# Patient Record
Sex: Female | Born: 1985 | Race: Black or African American | Hispanic: No | Smoking: Current every day smoker
Health system: Southern US, Community
[De-identification: ages and names within clinical notes are randomized; demographics above are authoritative.]

## PROBLEM LIST (undated history)

## (undated) ENCOUNTER — Inpatient Hospital Stay (HOSPITAL_COMMUNITY): Payer: Self-pay

## (undated) ENCOUNTER — Ambulatory Visit (HOSPITAL_COMMUNITY): Admission: EM | Payer: Medicaid Other | Source: Home / Self Care

## (undated) DIAGNOSIS — J302 Other seasonal allergic rhinitis: Secondary | ICD-10-CM

## (undated) DIAGNOSIS — F419 Anxiety disorder, unspecified: Secondary | ICD-10-CM

## (undated) DIAGNOSIS — A749 Chlamydial infection, unspecified: Secondary | ICD-10-CM

## (undated) DIAGNOSIS — T8384XA Pain from genitourinary prosthetic devices, implants and grafts, initial encounter: Secondary | ICD-10-CM

## (undated) DIAGNOSIS — F909 Attention-deficit hyperactivity disorder, unspecified type: Secondary | ICD-10-CM

## (undated) DIAGNOSIS — G43909 Migraine, unspecified, not intractable, without status migrainosus: Secondary | ICD-10-CM

## (undated) DIAGNOSIS — Z3A28 28 weeks gestation of pregnancy: Secondary | ICD-10-CM

## (undated) DIAGNOSIS — K589 Irritable bowel syndrome without diarrhea: Secondary | ICD-10-CM

## (undated) HISTORY — PX: IUD REMOVAL: SHX5392

## (undated) HISTORY — PX: NO PAST SURGERIES: SHX2092

## (undated) HISTORY — DX: Chlamydial infection, unspecified: A74.9

## (undated) HISTORY — PX: MOUTH SURGERY: SHX715

---
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
## 2007-02-25 ENCOUNTER — Emergency Department (HOSPITAL_COMMUNITY): Admission: EM | Admit: 2007-02-25 | Discharge: 2007-02-25 | Payer: Self-pay | Admitting: Emergency Medicine

## 2007-05-31 ENCOUNTER — Inpatient Hospital Stay (HOSPITAL_COMMUNITY): Admission: AD | Admit: 2007-05-31 | Discharge: 2007-06-01 | Payer: Self-pay | Admitting: Obstetrics and Gynecology

## 2007-05-31 ENCOUNTER — Emergency Department (HOSPITAL_COMMUNITY): Admission: EM | Admit: 2007-05-31 | Discharge: 2007-05-31 | Payer: Self-pay | Admitting: Family Medicine

## 2007-10-22 ENCOUNTER — Inpatient Hospital Stay (HOSPITAL_COMMUNITY): Admission: AD | Admit: 2007-10-22 | Discharge: 2007-10-22 | Payer: Self-pay | Admitting: Obstetrics

## 2007-12-22 ENCOUNTER — Inpatient Hospital Stay (HOSPITAL_COMMUNITY): Admission: AD | Admit: 2007-12-22 | Discharge: 2007-12-24 | Payer: Self-pay | Admitting: Obstetrics

## 2007-12-22 ENCOUNTER — Encounter (INDEPENDENT_AMBULATORY_CARE_PROVIDER_SITE_OTHER): Payer: Self-pay | Admitting: Obstetrics

## 2008-02-27 ENCOUNTER — Emergency Department (HOSPITAL_COMMUNITY): Admission: EM | Admit: 2008-02-27 | Discharge: 2008-02-28 | Payer: Self-pay | Admitting: Emergency Medicine

## 2009-10-13 ENCOUNTER — Encounter: Payer: Self-pay | Admitting: Family Medicine

## 2009-10-18 ENCOUNTER — Ambulatory Visit: Payer: Self-pay | Admitting: Family Medicine

## 2009-11-06 ENCOUNTER — Encounter (INDEPENDENT_AMBULATORY_CARE_PROVIDER_SITE_OTHER): Payer: Self-pay | Admitting: *Deleted

## 2009-11-06 DIAGNOSIS — F17200 Nicotine dependence, unspecified, uncomplicated: Secondary | ICD-10-CM | POA: Insufficient documentation

## 2009-12-28 ENCOUNTER — Telehealth: Payer: Self-pay | Admitting: Family Medicine

## 2010-01-10 ENCOUNTER — Ambulatory Visit: Payer: Self-pay | Admitting: Family Medicine

## 2010-01-10 ENCOUNTER — Encounter: Payer: Self-pay | Admitting: Family Medicine

## 2010-01-18 ENCOUNTER — Telehealth: Payer: Self-pay | Admitting: Family Medicine

## 2010-01-24 ENCOUNTER — Telehealth: Payer: Self-pay | Admitting: Family Medicine

## 2010-01-25 ENCOUNTER — Encounter: Payer: Self-pay | Admitting: *Deleted

## 2010-02-01 ENCOUNTER — Ambulatory Visit: Payer: Self-pay | Admitting: Family Medicine

## 2010-03-21 ENCOUNTER — Emergency Department (HOSPITAL_COMMUNITY): Admission: EM | Admit: 2010-03-21 | Discharge: 2010-03-21 | Payer: Self-pay | Admitting: Emergency Medicine

## 2010-04-20 ENCOUNTER — Encounter: Payer: Self-pay | Admitting: Family Medicine

## 2010-06-17 ENCOUNTER — Telehealth: Payer: Self-pay | Admitting: Family Medicine

## 2010-09-13 ENCOUNTER — Emergency Department (HOSPITAL_COMMUNITY): Admission: EM | Admit: 2010-09-13 | Discharge: 2010-09-13 | Payer: Self-pay | Admitting: Family Medicine

## 2010-12-04 NOTE — Progress Notes (Signed)
Summary: triage  Phone Note Call from Patient Call back at [phone]   Caller: Patient Summary of Call: Pt not feeling well and wanting to be seen today. Initial call taken by: Clydell Hakim,  December 28, 2009 12:16 PM  Follow-up for Phone Call        had bad pain last night in lower abd when she was having a BM. did not take any pain meds. feels weak & shaky. pain still coming & going. on depo x 1 yr. still bleeding every day. very much affecting her life. wants this investigated. to be here at 4pm to see her pcp Follow-up by: Golden Circle RN,  December 28, 2009 12:21 PM

## 2010-12-04 NOTE — Miscellaneous (Signed)
Summary: [name]  Clinical Lists Changes  Problems: Added new problem of [name] (ICD-305.1) 

## 2010-12-04 NOTE — Progress Notes (Signed)
Summary: pls call  Phone Note Call from Patient Call back at [phone]   Caller: Patient Summary of Call: has an IUD and needs to talk to doctor about it ASAP Initial call taken by: De Nurse,  January 24, 2010 4:26 PM  Follow-up for Phone Call        partner complaining about pain with intercourse. thinks it is related to IUD. only upon entry. patient unable to palpate strings. missed last appt for eval. recommend calling back for appt tomorrow. i am willing to see if placed on work in schedule Follow-up by: Lequita Asal  MD,  January 24, 2010 4:40 PM     Appended Document: pls call I called & made another appt. told her she must come to her appts or give 24hr notice. to Dennison Nancy, Photographer for letter to pt about her dnkas

## 2010-12-04 NOTE — Letter (Signed)
Summary: Probation Letter  [facility]  [address]   [place], [place] [address]   Phone: [phone]  Fax: [phone]    01/25/2010  Vanessa Wise [address] [place], [place]  [address]  Dear Ms. Tirrell,  With the goal of better serving all our patients the [facility] is following each patient's missed appointments.  You have missed at least 3 appointments with our practice.If you cannot keep your appointment, we expect you to call at least 24 hours before your appointment time.  Missing appointments prevents other patients from seeing [place] and makes it difficult to provide you with the best possible medical care.      1.   If you miss one more appointment, we will only give you limited medical services. This means we will not call in medication refills, complete a form, or make a referral for you except when you are here for a scheduled office visit.    2.   If you miss 2 or more appointments in the next year, we will dismiss you from our practice.    Our office staff can be reached at [phone] Monday through Friday from 8:30 a.m.-5:00 p.m. and will be glad to schedule your appointment as necessary.    Thank you.   [facility]

## 2010-12-04 NOTE — Assessment & Plan Note (Signed)
Summary: iud insertion,tcb   Vital Signs:  Patient profile:   25 year old female Weight:      157.6 pounds Temp:     97.7 degrees F oral Pulse rate:   88 / minute Pulse rhythm:   regular BP sitting:   118 / 79  (left arm)  Vitals Entered By: Loralee Pacas CMA (January 10, 2010 3:31 PM)  Primary Care Provider:  Lequita Asal  MD  CC:  IUD insertion.  History of Present Illness: Patient is 25 y/o G2 here for Mirena IUD. in monogamous relationship. currently using depo for contraception. UPT negative.   Procedure note- Mirena insertion Upreg was negative. Risks and benefits were discussed with patient, and consent was obtained. Signed consent form to be scanned into electronic medical record. Speculum was placed in vagina. Using standard sterile procedure, the cervix was visualized then cleaned with betadine. The cervix was then grasped at the 10 and 2 o'clock positions with the tenaculum. The uterus was sounded and found to have a depth of 9.5 cm. The IUD and insertion device were removed from sterile packaging and adjusted to 9.5 cm. The IUD insertion device was inserted into the uterus, via the cervix, and the IUD was deployed. The insertion device was removed, and the IUD remained in place. The strings were cut to approximately 3 cm in length. The tenaculum was removed, and pressure was applied to cervix using fox swabs. Hemostasis was achieved after minimal bleeding. Patient tolerated procedure well and there were no complications. She was given patient information.  Allergies: No Known Drug Allergies   Impression & Recommendations:  Problem # 1:  ENC FOR INSERTION INTRAUTERINE CONTRACEPT DEVICE (ICD-V25.11) Assessment New  see procedure note. patient tolerated well. remove in 5 years or sooner per patient request.   Orders: IUD insert- [facility] [phone])  Other Orders: U Preg-[facility] ([id number]) IUD Supply-[facility] (S0630)  Laboratory Results   Urine Tests  Date/Time Received: January 10, 2010 3:33 PM  Date/Time Reported: January 10, 2010 3:42 PM     Urine HCG: negative Comments: ...............test performed by......Marland KitchenBonnie A. [place], MLS (ASCP)cm

## 2010-12-04 NOTE — Progress Notes (Signed)
Summary: Triage  Phone Note Call from Patient Call back at [phone]   Reason for Call: Talk to Nurse Summary of Call: pt recently got IUD & is concerned b/c she cannot feel the strings and her b/f says it hurts him during intercourse. Initial call taken by: Knox Royalty,  January 18, 2010 11:44 AM  Follow-up for Phone Call        states boyfriend can feel strings when he is inside & it hurts him. appt tomorrow with pcp Follow-up by: Golden Circle RN,  January 18, 2010 11:47 AM

## 2010-12-04 NOTE — Progress Notes (Signed)
  Phone Note Call from Patient   Caller: Patient Summary of Call: Patient called with CC of mild irritation in her vagina during intercourse.  States it has been a long-term problem, has seen Dr. Lanier Prude for this issue in the past.  Dr. Lanier Prude stated strings are of good length and not to shorten.  Pt states boyfriend still complains of feeling strings during intercourse.  Patient would like to have vagina inspected to make sure irritation is nothing to be worried about.  Denies any sexual intercourse for past several weeks, any vaginal discharge, vaginal bleeding, abdominal pain, fever, chills, red streaks in genitals/legs/abdomen.  Patient states she would also like to have Mirena removed.  Wants to come as work-in tomorrow morning.  Explained there may be a wait.  She only wants vagina inspected to make sure not infected or irritated.  Initial call taken by: Renold Don MD,  June 17, 2010 8:40 AM

## 2010-12-04 NOTE — Assessment & Plan Note (Signed)
Summary: iud problem,tcb   Vital Signs:  Patient profile:   25 year old female Temp:     98.8 degrees F CC: iud problem Comments boyfriend is complaining that the IUD is bothering him   Primary Care Provider:  Lequita Asal  MD  CC:  iud problem.  History of Present Illness: 1.  Got IUD in early march.  having problems with intercourse since then.  first time, boyfriend felt like he could not get into her vagina and he felt strings.  then next time painfu for herl--like bad menstrual cramps.  also, she can't feel strings.  she wants Iud checked to make sure it is in place.    Current Medications (verified): 1)  None  Allergies: No Known Drug Allergies  Review of Systems General:  Denies fever and loss of appetite. GU:  Denies abnormal vaginal bleeding and discharge.  Physical Exam  General:  Well-developed,well-nourished,in no acute distress; alert,appropriate and cooperative throughout examination Genitalia:  Pelvic Exam:        External: normal female genitalia without lesions or masses        Vagina: normal without lesions or masses        Cervix: normal without lesions or masses; IUD strings visualized        Adnexa: normal bimanual exam without masses or fullness        Uterus: normal by palpation        Pap smear: not performed Additional Exam:  vital signs reviewed    Impression & Recommendations:  Problem # 1:  CONTRACEPTIVE MANAGEMENT (ICD-V25.09) Assessment Unchanged  IUD in place.  strings appear to be a good length; would not want to shorten.  provided reassurance.    Orders: [facility]- Est Level  3 ([id number])  Patient Instructions: 1)  It was nice to see you today. 2)  Your IUD is in the right place; I think everything looks fine. 3)  Call us if you have any problems or questions.

## 2010-12-04 NOTE — Miscellaneous (Signed)
  Clinical Lists Changes  Medications: Added new medication of MIRENA 20 MCG/24HR IUD (LEVONORGESTREL)

## 2010-12-04 NOTE — Miscellaneous (Signed)
  Clinical Lists Changes  Problems: Removed problem of ENC FOR INSERTION INTRAUTERINE CONTRACEPT DEVICE (ICD-V25.11) Removed problem of CONTRACEPTIVE MANAGEMENT (ICD-V25.09) Removed problem of ABNORMAL VAGINAL BLEEDING (ICD-626.9)

## 2010-12-07 NOTE — Miscellaneous (Signed)
Summary: Consent for IUD Insertion  Consent for IUD Insertion   Imported By: Knox Royalty 04/12/2010 10:11:31  _____________________________________________________________________  External Attachment:    Type:   Image     Comment:   External Document

## 2010-12-12 ENCOUNTER — Encounter: Payer: Self-pay | Admitting: *Deleted

## 2011-03-06 ENCOUNTER — Emergency Department (HOSPITAL_COMMUNITY)
Admission: EM | Admit: 2011-03-06 | Discharge: 2011-03-06 | Disposition: A | Discharge status: Home / Self Care | Payer: Self-pay | Attending: Emergency Medicine | Admitting: Emergency Medicine

## 2011-03-06 DIAGNOSIS — K089 Disorder of teeth and supporting structures, unspecified: Secondary | ICD-10-CM | POA: Insufficient documentation

## 2011-06-23 ENCOUNTER — Emergency Department (HOSPITAL_COMMUNITY)
Admission: EM | Admit: 2011-06-23 | Discharge: 2011-06-23 | Disposition: A | Discharge status: Home / Self Care | Payer: 59 | Attending: Emergency Medicine | Admitting: Emergency Medicine

## 2011-06-23 DIAGNOSIS — R112 Nausea with vomiting, unspecified: Secondary | ICD-10-CM | POA: Insufficient documentation

## 2011-06-23 DIAGNOSIS — R5381 Other malaise: Secondary | ICD-10-CM | POA: Insufficient documentation

## 2011-06-23 DIAGNOSIS — R51 Headache: Secondary | ICD-10-CM | POA: Insufficient documentation

## 2011-06-23 LAB — POCT I-STAT, CHEM 8
Calcium, Ion: 1.14 mmol/L (ref 1.12–1.32)
Chloride: 106 mEq/L (ref 96–112)
Creatinine, Ser: 0.9 mg/dL (ref 0.50–1.10)
Glucose, Bld: 93 mg/dL (ref 70–99)
Hemoglobin: 15 g/dL (ref 12.0–15.0)
Potassium: 3.5 mEq/L (ref 3.5–5.1)

## 2011-06-23 LAB — RAPID URINE DRUG SCREEN, HOSP PERFORMED
Cocaine: NOT DETECTED
Opiates: NOT DETECTED

## 2011-06-23 LAB — URINE MICROSCOPIC-ADD ON

## 2011-06-23 LAB — URINALYSIS, ROUTINE W REFLEX MICROSCOPIC
Glucose, UA: NEGATIVE mg/dL
Hgb urine dipstick: NEGATIVE
Specific Gravity, Urine: 1.028 (ref 1.005–1.030)

## 2011-06-27 ENCOUNTER — Inpatient Hospital Stay (INDEPENDENT_AMBULATORY_CARE_PROVIDER_SITE_OTHER)
Admission: RE | Admit: 2011-06-27 | Discharge: 2011-06-27 | Disposition: A | Discharge status: Home / Self Care | Payer: 59 | Source: Ambulatory Visit | Attending: Family Medicine | Admitting: Family Medicine

## 2011-06-27 DIAGNOSIS — R51 Headache: Secondary | ICD-10-CM

## 2011-06-28 ENCOUNTER — Ambulatory Visit: Payer: 59 | Admitting: Family Medicine

## 2011-06-28 ENCOUNTER — Telehealth: Payer: Self-pay | Admitting: Family Medicine

## 2011-06-28 NOTE — Telephone Encounter (Signed)
Patient did call back and spoke with Abundio Miu earlier this afternoon. States she was told she needs to come to office now and she is on her way. Her Uncle has something to do with his car now but she will be on her way.

## 2011-06-28 NOTE — Telephone Encounter (Signed)
I called patient and advised to come to office now and we will work here in. States she cannot get here until 4:00.  Advised she will need to come now because we are working her in .  States she will try and call back.

## 2011-06-28 NOTE — Telephone Encounter (Signed)
Patient called in earlier, very upset.  The following was entered into the wrong patient file.  I passed this information on to [place] who in turn spoke with Dr. Leveda Anna.  Dr. Leveda Anna arrange for the patient to come in and be seen by him.  That encounter follows.  Cammie Mcgee 06/28/2011 12:22 PM Signed  Is having problems with her Birth Control removed. Came in yesterday to be seen emergently yesterday, did not have an appt. She calls today because she is upset that she was turned away. I explained that this is not an Urgent Care office and that we only see patients by appt. She wants her medical records and she sounded threatening when she said she wants them today and someone will need to remove the Birth Control today before she has to get really ugly.

## 2011-06-28 NOTE — Telephone Encounter (Signed)
Vanessa Wise has had IUD Mirena since 01/2010.  Has had increasing headaches since then.  Now, having memory loss with headache.  Has been seen in ER x 2.  [phone].  Will see today only to take Mirena out.  Make an appointment in 2 weeks to follow up with headaches and decide on future birth control

## 2011-07-10 ENCOUNTER — Ambulatory Visit: Payer: 59 | Admitting: Family Medicine

## 2011-07-19 ENCOUNTER — Emergency Department (HOSPITAL_COMMUNITY)
Admission: EM | Admit: 2011-07-19 | Discharge: 2011-07-19 | Disposition: A | Discharge status: Home / Self Care | Payer: 59 | Attending: Emergency Medicine | Admitting: Emergency Medicine

## 2011-07-19 DIAGNOSIS — R112 Nausea with vomiting, unspecified: Secondary | ICD-10-CM | POA: Insufficient documentation

## 2011-07-19 DIAGNOSIS — R1013 Epigastric pain: Secondary | ICD-10-CM | POA: Insufficient documentation

## 2011-07-19 LAB — DIFFERENTIAL
Eosinophils Relative: 2 % (ref 0–5)
Lymphocytes Relative: 36 % (ref 12–46)
Lymphs Abs: 3.2 10*3/uL (ref 0.7–4.0)
Monocytes Absolute: 0.6 10*3/uL (ref 0.1–1.0)
Monocytes Relative: 6 % (ref 3–12)

## 2011-07-19 LAB — COMPREHENSIVE METABOLIC PANEL
Alkaline Phosphatase: 59 U/L (ref 39–117)
BUN: 13 mg/dL (ref 6–23)
GFR calc Af Amer: >60 mL/min (ref >60)
GFR calc non Af Amer: >60 mL/min (ref >60)
Glucose, Bld: 97 mg/dL (ref 70–99)
Potassium: 3.5 mEq/L (ref 3.5–5.1)
Total Bilirubin: 0.3 mg/dL (ref 0.3–1.2)
Total Protein: 8.1 g/dL (ref 6.0–8.3)

## 2011-07-19 LAB — URINALYSIS, ROUTINE W REFLEX MICROSCOPIC
Bilirubin Urine: NEGATIVE
Hgb urine dipstick: NEGATIVE
Specific Gravity, Urine: 1.033 — ABNORMAL HIGH (ref 1.005–1.030)
Urobilinogen, UA: 1 mg/dL (ref 0.0–1.0)

## 2011-07-19 LAB — CBC
HCT: 37.2 % (ref 36.0–46.0)
MCHC: 34.1 g/dL (ref 30.0–36.0)
MCV: 85.3 fL (ref 78.0–100.0)
RDW: 13 % (ref 11.5–15.5)
WBC: 8.8 10*3/uL (ref 4.0–10.5)

## 2011-07-19 LAB — LIPASE, BLOOD: Lipase: 28 U/L (ref 11–59)

## 2011-07-26 LAB — COMPREHENSIVE METABOLIC PANEL
Albumin: 2.5 — ABNORMAL LOW
BUN: 10
Calcium: 8.2 — ABNORMAL LOW
Creatinine, Ser: 0.76
Potassium: 3.8
Total Protein: 6

## 2011-07-26 LAB — CBC
HCT: 31.7 — ABNORMAL LOW
HCT: 32.6 — ABNORMAL LOW
Hemoglobin: 11.2 — ABNORMAL LOW
Hemoglobin: 11.4 — ABNORMAL LOW
MCHC: 33.7
MCHC: 34
MCV: 90
MCV: 90.2
Platelets: 96 — ABNORMAL LOW
RBC: 3.52 — ABNORMAL LOW
RBC: 3.66 — ABNORMAL LOW
RBC: 3.67 — ABNORMAL LOW
WBC: 4.7
WBC: 5.5

## 2011-07-26 LAB — URIC ACID: Uric Acid, Serum: 7

## 2011-08-09 LAB — URINALYSIS, ROUTINE W REFLEX MICROSCOPIC
Bilirubin Urine: NEGATIVE
Glucose, UA: NEGATIVE
Ketones, ur: 15 — AB
Protein, ur: NEGATIVE

## 2011-08-19 LAB — URINALYSIS, ROUTINE W REFLEX MICROSCOPIC
Glucose, UA: NEGATIVE
Ketones, ur: >80 — AB
Nitrite: NEGATIVE
Specific Gravity, Urine: 1.025
pH: 6

## 2011-08-19 LAB — POCT URINALYSIS DIP (DEVICE)
Glucose, UA: NEGATIVE
Hgb urine dipstick: NEGATIVE
Ketones, ur: >=160 — AB
Specific Gravity, Urine: 1.02
Urobilinogen, UA: 1

## 2011-08-19 LAB — CBC
MCHC: 33.3
MCV: 86.6
Platelets: 241
RBC: 4.27
RDW: 13.1

## 2011-08-19 LAB — BASIC METABOLIC PANEL
BUN: 9
CO2: 25
Calcium: 9.4
Chloride: 105
Creatinine, Ser: 0.7
GFR calc Af Amer: >60
Glucose, Bld: 83

## 2011-08-19 LAB — URINE CULTURE
Colony Count: NO GROWTH
Culture: NO GROWTH

## 2011-08-19 LAB — HCG, QUANTITATIVE, PREGNANCY: hCG, Beta Chain, Quant, S: 90986 — ABNORMAL HIGH

## 2011-08-19 LAB — WET PREP, GENITAL

## 2011-08-19 LAB — ABO/RH: ABO/RH(D): O POS

## 2011-08-19 LAB — TYPE AND SCREEN: ABO/RH(D): O POS

## 2011-08-19 LAB — GC/CHLAMYDIA PROBE AMP, GENITAL
Chlamydia, DNA Probe: NEGATIVE
GC Probe Amp, Genital: NEGATIVE

## 2011-09-27 ENCOUNTER — Telehealth: Payer: Self-pay | Admitting: Family Medicine

## 2011-09-27 NOTE — Telephone Encounter (Signed)
White vaginal discharge x 2-3 days. Itching and irritation x 1 day. Had intercourse yesterday and today and it was painful. Painful urination after intercourse. Patient had yeast infection in the past. She feels that the discharge is like yeast but this is more irritating.  Patient is otherwise well. Advised pt that this sounds like yeast infection but could also be BV, trich, other STI. Pt's states her BF is now complaining of penile discomfort and "bumps". Asked pt to come in on Monday for pelvic exam, cx, ua and that her boyfriend should also be checked. Advised warm compress, avoid irritating soaps and sex until checked. Pt voiced understanding.

## 2011-10-15 ENCOUNTER — Telehealth: Payer: Self-pay | Admitting: *Deleted

## 2011-10-15 NOTE — Telephone Encounter (Signed)
Phone # [phone]. Patient calls and message was sent to triage nurse on wrong patient .  It was determined that this is correct patient. States she has had vaginal discharge and itching and treated herself OTC with Monistat for 6 days.   Now her partner tells her that he is having itching on penis. She is wondering if cream she used could have caused his itching . Consulted with Dr. Jennette Kettle and she advises that this is a possibility but only way to know is for him to be checked. Also   suggested that patient make appointment for follow up  and appointment is scheduled for tomorrow.

## 2011-10-16 ENCOUNTER — Ambulatory Visit: Payer: 59

## 2011-10-27 ENCOUNTER — Telehealth: Payer: Self-pay | Admitting: Family Medicine

## 2011-10-27 NOTE — Telephone Encounter (Signed)
Had a yeast infection and used monistat cream which helped. Boyfriend feels irritation of his penis. Will come in next week for STI testing. Feels well otherwise.

## 2011-12-13 ENCOUNTER — Telehealth: Payer: Self-pay | Admitting: Family Medicine

## 2011-12-13 NOTE — Telephone Encounter (Signed)
Patient would like to speak to the nurse about some problems with her Birth Control.

## 2011-12-13 NOTE — Telephone Encounter (Signed)
Message left on voicemail to return call.

## 2011-12-16 NOTE — Telephone Encounter (Signed)
Message again left advised  to call back if needed otherwise will not try to call again.

## 2011-12-16 NOTE — Telephone Encounter (Signed)
Message again  left to return call. 

## 2011-12-31 ENCOUNTER — Ambulatory Visit: Payer: 59 | Admitting: Family Medicine

## 2012-01-28 ENCOUNTER — Other Ambulatory Visit (HOSPITAL_COMMUNITY)
Admission: RE | Admit: 2012-01-28 | Discharge: 2012-01-28 | Disposition: A | Discharge status: Home / Self Care | Payer: 59 | Source: Ambulatory Visit | Attending: Family Medicine | Admitting: Family Medicine

## 2012-01-28 ENCOUNTER — Other Ambulatory Visit: Payer: Self-pay | Admitting: Family Medicine

## 2012-01-28 ENCOUNTER — Ambulatory Visit: Payer: 59 | Admitting: Family Medicine

## 2012-01-28 ENCOUNTER — Ambulatory Visit (INDEPENDENT_AMBULATORY_CARE_PROVIDER_SITE_OTHER): Payer: 59 | Admitting: Family Medicine

## 2012-01-28 ENCOUNTER — Encounter: Payer: Self-pay | Admitting: Family Medicine

## 2012-01-28 VITALS — BP 125/71 | HR 113 | Ht 64.5 in | Wt 137.0 lb

## 2012-01-28 DIAGNOSIS — G43709 Chronic migraine without aura, not intractable, without status migrainosus: Secondary | ICD-10-CM

## 2012-01-28 DIAGNOSIS — F17200 Nicotine dependence, unspecified, uncomplicated: Secondary | ICD-10-CM

## 2012-01-28 DIAGNOSIS — Z113 Encounter for screening for infections with a predominantly sexual mode of transmission: Secondary | ICD-10-CM | POA: Insufficient documentation

## 2012-01-28 DIAGNOSIS — N898 Other specified noninflammatory disorders of vagina: Secondary | ICD-10-CM

## 2012-01-28 DIAGNOSIS — F411 Generalized anxiety disorder: Secondary | ICD-10-CM

## 2012-01-28 DIAGNOSIS — T8332XA Displacement of intrauterine contraceptive device, initial encounter: Secondary | ICD-10-CM | POA: Insufficient documentation

## 2012-01-28 DIAGNOSIS — F419 Anxiety disorder, unspecified: Secondary | ICD-10-CM | POA: Insufficient documentation

## 2012-01-28 DIAGNOSIS — F1220 Cannabis dependence, uncomplicated: Secondary | ICD-10-CM | POA: Insufficient documentation

## 2012-01-28 DIAGNOSIS — O26899 Other specified pregnancy related conditions, unspecified trimester: Secondary | ICD-10-CM

## 2012-01-28 DIAGNOSIS — R109 Unspecified abdominal pain: Secondary | ICD-10-CM | POA: Insufficient documentation

## 2012-01-28 DIAGNOSIS — T8389XA Other specified complication of genitourinary prosthetic devices, implants and grafts, initial encounter: Secondary | ICD-10-CM

## 2012-01-28 LAB — POCT URINE PREGNANCY: Preg Test, Ur: NEGATIVE

## 2012-01-28 LAB — POCT URINALYSIS DIPSTICK
Blood, UA: NEGATIVE
Glucose, UA: NEGATIVE
Ketones, UA: 80
Spec Grav, UA: >=1.03

## 2012-01-28 LAB — POCT WET PREP (WET MOUNT)

## 2012-01-28 MED ORDER — METRONIDAZOLE 500 MG PO TABS: 500.0000 mg | ORAL_TABLET | Freq: Two times a day (BID) | ORAL | Status: AC

## 2012-01-28 MED ORDER — SUMATRIPTAN SUCCINATE 25 MG PO TABS: ORAL_TABLET | ORAL | Status: DC

## 2012-01-28 NOTE — Assessment & Plan Note (Signed)
Mild abdominal cramping likely do to BV versus IUD. KUB pending.

## 2012-01-28 NOTE — Assessment & Plan Note (Signed)
Patient with migraine disorder. Neuro exam is essentially normal. She is a positive family history for migraine.  Plan to provide abortive therapy with sumatriptan.  Additionally I recommend follow with primary care in 1-4 weeks for initiation of prophylactic therapy.  Perhaps propranolol.  Discussed warning signs the patient.

## 2012-01-28 NOTE — Patient Instructions (Signed)
Thank you for coming in today. I will call you with your test results.  Make sure your phone is handy.  Use the summitriptan as needed for migraines.  We need a Xray to make sure the mirena is in your body.  Follow up with Dr. Rivka Safer in 1-4 weeks.  Stop using mariajuana. It is making your anxiety worse.

## 2012-01-28 NOTE — Assessment & Plan Note (Signed)
Patient is a daily marijuana user this is contributing to her anxiety I advised cessation

## 2012-01-28 NOTE — Assessment & Plan Note (Signed)
Her tobacco dependence is worsening her bacterial vaginosis I advised cessation

## 2012-01-28 NOTE — Assessment & Plan Note (Signed)
BV present on wet prep.  GC, chlamydia, HIV, RPR are pending.  Plan to treat with metronidazole, and followup in one to 4 weeks with Dr. Rivka Safer her primary care doctor.

## 2012-01-28 NOTE — Assessment & Plan Note (Signed)
No IUD strings are present. Patient is not pregnant. Plan for a KUB to confirm position of IUD.

## 2012-01-28 NOTE — Progress Notes (Signed)
Vanessa Wise is a 26 y.o. female who presents to [facility] today for   1) Headaches: Present for up to 2 years now. Headaches occur approximately 3 times a week and are often severe and debilitating. They will last until she goes to sleep. They're usually unilateral and pounding. Sometimes they're associated with vomiting. Often they are associated with photophobia and phonophobia. She denies any numbness dizziness lack of coordination. She is a positive family history for migraines. She has not had her headaches diagnosed or treated. Daily Cipro for an approximately one time a week for headache.  2) vaginal discomfort. Patient has Mirena IUD, and will occasionally experience cramping following sex. Recently she's noted a foul-smelling vaginal discharge. She has intermittent vaginal spotting as well. She does not use condoms when she has sex and she is more than one sexual partner. She self treated for a yeast infection in January.  She denies any significant abdominal pain but does note some cramping.     PMH, SH reviewed: Positive for headaches. Patient is a smoker and daily marijuana user ROS as above otherwise neg. No Chest pain, palpitations, SOB, Fever, Chills, Abd pain, N/V/D. positive for daily anxiety with occasional panic.  Medications reviewed. Current Outpatient Prescriptions  Medication Sig Dispense Refill  . levonorgestrel (MIRENA) 20 MCG/24HR IUD 1 each by Intrauterine route once.        . SUMAtriptan (IMITREX) 25 MG tablet 1 pill at start of migraine. May repeat in 2 hours if still having headache. (limit of 2 pills per day)  10 tablet  1    Exam:  BP 125/71  Pulse 113  Ht 5' 4.5" (1.638 m)  Wt 137 lb (62.143 kg)  BMI 23.15 kg/m2 Gen: Well NAD, anxious appearing HEENT: EOMI,  MMM Lungs: CTABL Nl WOB Heart: RRR no MRG Abd: NABS, NT, ND Exts: Non edematous BL  LE, warm and well perfused.  GYN: Normal external genitalia. Vaginal canal with white thin discharge. Cervix normal  appearing. No IUD strings are present. Bimanual exam is normal with normal uterus size and position and no adnexal tenderness or fullness.   Neuro: Alert and oriented x3 cranial nerves II through XII are intact pupillary light reflexes normal.  Gait normal coordination normal.    Results for orders placed in visit on 01/28/12 (from the past 72 hour(s))  POCT WET PREP (WET MOUNT)     Status: Abnormal   Collection Time   01/28/12 11:30 AM      Component Value Range Comment   Source Wet Prep POC VAG      WBC, Wet Prep HPF POC 15-25      Bacteria Wet Prep HPF POC 3+ COCCI      Clue Cells Wet Prep HPF POC Many      Yeast Wet Prep HPF POC None      Trichomonas Wet Prep HPF POC none     POCT URINALYSIS DIPSTICK     Status: Normal   Collection Time   01/28/12 11:30 AM      Component Value Range Comment   Color, UA DARK YELLOW      Clarity, UA CLEAR      Glucose, UA NEG      Bilirubin, UA SMALL      Ketones, UA 80      Spec Grav, UA >=1.030      Blood, UA NEG      pH, UA 6.0      Protein, UA TRACE  Urobilinogen, UA 4.0      Nitrite, UA NEG      Leukocytes, UA Negative     POCT URINE PREGNANCY     Status: Normal   Collection Time   01/28/12 11:30 AM      Component Value Range Comment   Preg Test, Ur Negative       Abdominal KUB pending

## 2012-01-29 LAB — DRUG SCREEN, URINE
Amphetamine Screen, Ur: NEGATIVE
Benzodiazepines.: NEGATIVE
Cocaine Metabolites: NEGATIVE
Marijuana Metabolite: POSITIVE — AB

## 2012-01-30 ENCOUNTER — Telehealth: Payer: Self-pay | Admitting: *Deleted

## 2012-01-30 NOTE — Telephone Encounter (Signed)
Patient called concerned about VM left for her.  I informed her she has BV and that medication to treat had been sent to her. She is unable to pick up at that pharmacy. Will send to outpatient pharmacy at [facility]. Told her not to drink alcohol and avoid intercourse for about a week until she could clear infection.  Patient expressed understanding and amenable to plan.

## 2012-01-30 NOTE — Telephone Encounter (Signed)
Left message for patient to return call. She needs to schedule nurse visit for STD Tx.Labrandon Knoch, Rodena Medin

## 2012-02-03 ENCOUNTER — Encounter: Payer: Self-pay | Admitting: Family Medicine

## 2012-02-03 ENCOUNTER — Telehealth: Payer: Self-pay | Admitting: Family Medicine

## 2012-02-03 DIAGNOSIS — A749 Chlamydial infection, unspecified: Secondary | ICD-10-CM | POA: Insufficient documentation

## 2012-02-03 HISTORY — DX: Chlamydial infection, unspecified: A74.9

## 2012-02-03 MED ORDER — DOXYCYCLINE HYCLATE 100 MG PO TABS: 100.0000 mg | ORAL_TABLET | Freq: Two times a day (BID) | ORAL | Status: AC

## 2012-02-03 NOTE — Telephone Encounter (Signed)
Discussed dx of Chlamydia with patient. Discussed doxycycline with patient. She has no allergies.

## 2012-02-03 NOTE — Telephone Encounter (Signed)
Message copied by Edd Arbour on Mon Feb 03, 2012  1:32 PM ------      Message from: Tivis Ringer      Created: Mon Feb 03, 2012  8:56 AM                   ----- Message -----         From: [facility]         Sent: 01/29/2012   3:52 PM           To: Sanjuana Letters, MD

## 2012-02-04 ENCOUNTER — Telehealth: Payer: Self-pay | Admitting: Family Medicine

## 2012-02-04 NOTE — Telephone Encounter (Signed)
Spoke with pharmacy and Rx is ready to be picked up.  Called patient back twice and phone rung once and stopped ringing.  No voicemail available.  Will call patient back later today or await call back from patient.  Gaylene Brooks, RN

## 2012-02-04 NOTE — Telephone Encounter (Signed)
Patient is calling because [facility] did not receive the Rx for doxycycline.  She is asking for it to please be resent.

## 2012-02-04 NOTE — Telephone Encounter (Signed)
Faxed report to [facility]. Pt treated. Lorenda Hatchet, Renato Battles

## 2012-02-11 ENCOUNTER — Telehealth: Payer: Self-pay | Admitting: Family Medicine

## 2012-02-11 NOTE — Telephone Encounter (Signed)
Message copied by Edd Arbour on Tue Feb 11, 2012  8:48 AM ------      Message from: Barbaraann Barthel      Created: Mon Feb 10, 2012  3:40 PM                   ----- Message -----         From: Nestor Ramp, MD         Sent: 01/30/2012   4:13 PM           To: Barbaraann Barthel, MD                        ----- Message -----         From: [facility]         Sent: 01/29/2012   3:52 PM           To: Nestor Ramp, MD

## 2012-02-14 ENCOUNTER — Other Ambulatory Visit: Payer: Self-pay | Admitting: Family Medicine

## 2012-02-14 DIAGNOSIS — A749 Chlamydial infection, unspecified: Secondary | ICD-10-CM

## 2012-02-14 MED ORDER — AZITHROMYCIN 1 G PO PACK: 1.0000 g | PACK | Freq: Once | ORAL | Status: DC

## 2012-02-25 ENCOUNTER — Ambulatory Visit: Payer: 59 | Admitting: Family Medicine

## 2012-03-04 ENCOUNTER — Ambulatory Visit (INDEPENDENT_AMBULATORY_CARE_PROVIDER_SITE_OTHER): Payer: 59 | Admitting: Family Medicine

## 2012-03-04 ENCOUNTER — Emergency Department (HOSPITAL_COMMUNITY)
Admission: EM | Admit: 2012-03-04 | Discharge: 2012-03-04 | Payer: 59 | Source: Home / Self Care | Attending: Emergency Medicine | Admitting: Emergency Medicine

## 2012-03-04 ENCOUNTER — Ambulatory Visit
Admission: RE | Admit: 2012-03-04 | Discharge: 2012-03-04 | Disposition: A | Discharge status: Home / Self Care | Payer: 59 | Source: Ambulatory Visit | Attending: Family Medicine | Admitting: Family Medicine

## 2012-03-04 ENCOUNTER — Encounter: Payer: Self-pay | Admitting: Family Medicine

## 2012-03-04 DIAGNOSIS — T8332XA Displacement of intrauterine contraceptive device, initial encounter: Secondary | ICD-10-CM

## 2012-03-04 DIAGNOSIS — T8389XA Other specified complication of genitourinary prosthetic devices, implants and grafts, initial encounter: Secondary | ICD-10-CM

## 2012-03-04 DIAGNOSIS — N912 Amenorrhea, unspecified: Secondary | ICD-10-CM

## 2012-03-04 DIAGNOSIS — R109 Unspecified abdominal pain: Secondary | ICD-10-CM

## 2012-03-04 DIAGNOSIS — N760 Acute vaginitis: Secondary | ICD-10-CM

## 2012-03-04 LAB — POCT WET PREP (WET MOUNT): Clue Cells Wet Prep Whiff POC: NEGATIVE

## 2012-03-04 NOTE — Patient Instructions (Signed)
Make an appointment to see Dr Rivka Safer as soon as can  Get the xray  Consider stress or depression as a cause of your abdomen pain and discuss Dr Rivka Safer  If the pain is getting worse or you have severe bleeding or are feeling very depressed call us immediately

## 2012-03-04 NOTE — Progress Notes (Signed)
  Subjective:    Patient ID: Vanessa Wise, female    DOB: 1985/11/17, 26 y.o.   MRN: [id number]  HPI  Nausea/ vomiting Abdominal discomfort Has had continued intermittent AM nausea or vomiting associated with vague mild lower abdomen discomfort since last visit in 3/26.  Never got xray for IUD.  Did take treatment for bv which helped her discharge.   She would like her meriena removed (not sure when placed but believe about 2 years ago) because she believes is making her feel weird all over.  No fevers or bleeding or severe pain or vaginal discharge now  Review of Symptoms - see HPI  PMH - Smoking status noted.      Review of Systems     Objective:   Physical Exam Genitalia:  Normal introitus for age, no external lesions, scant white vaginal discharge, mucosa pink and moist, no vaginal or cervical lesions, No IUD string seen no vaginal atrophy, no friaility or hemorrhage, normal uterus size and position, no adnexal masses or tenderness Abdomen: soft and non-tender without masses, organomegaly or hernias noted.  No guarding or rebound Has gained about 8 lbs since last visit       Assessment & Plan:

## 2012-03-04 NOTE — Assessment & Plan Note (Signed)
Unchanged.  No signs of serious intraabdominal or gyn cause.  Need to locate IUD.   I briefly discussed stress/anxiety as a possible cause she readily agreed that it could be.   I suggested she discuss with her PCP at their next visit.

## 2012-03-04 NOTE — Assessment & Plan Note (Signed)
No sign on exam.  No symptoms consistent with infection or perforation. Suspect malpositioned in uterus.  Encouarged her to get her previously ordered xray asap

## 2012-07-01 ENCOUNTER — Telehealth: Payer: Self-pay | Admitting: Family Medicine

## 2012-07-01 ENCOUNTER — Ambulatory Visit: Payer: 59

## 2012-07-01 NOTE — Telephone Encounter (Signed)
Calling complaining of vaginal discharge and odor. Patient was treated for BV in May and is concerned she has it again. Told patient she must come to the clinic to be evaluated, and was scheduled at 10:00 am with cross cover.  Arkie Tagliaferro M. Alazne Quant, M.D.

## 2013-02-03 ENCOUNTER — Ambulatory Visit: Payer: 59 | Admitting: Family Medicine

## 2013-03-31 ENCOUNTER — Telehealth: Payer: Self-pay | Admitting: Family Medicine

## 2013-03-31 NOTE — Telephone Encounter (Signed)
LMOVM for patient to call back.  There are available appts on [facility] tomorrow if patient would like IUD removed.  Emilie Rutter, Darlyne Russian, CMA

## 2013-03-31 NOTE — Telephone Encounter (Signed)
Pt wants IUD taken out today.  Says the hospital says the people who put it in have to take it out. She wants it out immedately. " If it only took 15 minutes to put it in, it should only take 15 to take it out" Please advise

## 2013-04-01 NOTE — Telephone Encounter (Signed)
Patient called back and "wants IUD taken out today!"  Offered appt this morning with [facility], but she does not have a ride.  Patient started yelling, "y'all put this thing in me and y'all are gonna take it out of me. I am coming up there today even if I don't have an appt" and hung up.

## 2013-05-17 ENCOUNTER — Telehealth: Payer: Self-pay | Admitting: *Deleted

## 2013-05-17 ENCOUNTER — Emergency Department (HOSPITAL_COMMUNITY)
Admission: EM | Admit: 2013-05-17 | Discharge: 2013-05-17 | Disposition: A | Discharge status: Home / Self Care | Payer: 59 | Source: Home / Self Care

## 2013-05-17 NOTE — Telephone Encounter (Signed)
Pt reports right side pain , vaginal odor - white creamy discharge and would like to have mirena removed as soon as possible ( uncomfortable during sex per pt) - appointment made for Thursday at pt request. Wyatt Haste, RN-BSN

## 2013-05-19 NOTE — Telephone Encounter (Signed)
error 

## 2013-05-20 ENCOUNTER — Ambulatory Visit: Payer: 59 | Admitting: Family Medicine

## 2013-05-26 ENCOUNTER — Ambulatory Visit: Payer: 59 | Admitting: Family Medicine

## 2013-08-24 ENCOUNTER — Emergency Department (HOSPITAL_COMMUNITY)
Admission: EM | Admit: 2013-08-24 | Discharge: 2013-08-24 | Disposition: A | Discharge status: Home / Self Care | Payer: 59 | Attending: Emergency Medicine | Admitting: Emergency Medicine

## 2013-08-24 ENCOUNTER — Emergency Department (HOSPITAL_COMMUNITY): Payer: 59

## 2013-08-24 DIAGNOSIS — R141 Gas pain: Secondary | ICD-10-CM | POA: Insufficient documentation

## 2013-08-24 DIAGNOSIS — N739 Female pelvic inflammatory disease, unspecified: Secondary | ICD-10-CM | POA: Insufficient documentation

## 2013-08-24 DIAGNOSIS — R142 Eructation: Secondary | ICD-10-CM | POA: Insufficient documentation

## 2013-08-24 DIAGNOSIS — Z3202 Encounter for pregnancy test, result negative: Secondary | ICD-10-CM | POA: Insufficient documentation

## 2013-08-24 DIAGNOSIS — Z79899 Other long term (current) drug therapy: Secondary | ICD-10-CM | POA: Insufficient documentation

## 2013-08-24 DIAGNOSIS — F17200 Nicotine dependence, unspecified, uncomplicated: Secondary | ICD-10-CM | POA: Insufficient documentation

## 2013-08-24 DIAGNOSIS — Z8619 Personal history of other infectious and parasitic diseases: Secondary | ICD-10-CM | POA: Insufficient documentation

## 2013-08-24 DIAGNOSIS — N730 Acute parametritis and pelvic cellulitis: Secondary | ICD-10-CM

## 2013-08-24 LAB — WET PREP, GENITAL: Trich, Wet Prep: NONE SEEN

## 2013-08-24 LAB — URINALYSIS, ROUTINE W REFLEX MICROSCOPIC
Bilirubin Urine: NEGATIVE
Glucose, UA: NEGATIVE mg/dL
Hgb urine dipstick: NEGATIVE
Ketones, ur: 15 mg/dL — AB
Leukocytes, UA: NEGATIVE
pH: 6 (ref 5.0–8.0)

## 2013-08-24 MED ORDER — AZITHROMYCIN 250 MG PO TABS
1000.0000 mg | ORAL_TABLET | Freq: Once | ORAL | Status: AC
  Administered 2013-08-24: 1000 mg via ORAL
  Filled 2013-08-24: qty 4

## 2013-08-24 MED ORDER — DOXYCYCLINE HYCLATE 100 MG PO CAPS: 100.0000 mg | ORAL_CAPSULE | Freq: Two times a day (BID) | ORAL | Status: DC

## 2013-08-24 MED ORDER — CEFTRIAXONE SODIUM 250 MG IJ SOLR
250.0000 mg | Freq: Once | INTRAMUSCULAR | Status: AC
  Administered 2013-08-24: 250 mg via INTRAMUSCULAR
  Filled 2013-08-24: qty 250

## 2013-08-24 MED ORDER — FLUCONAZOLE 100 MG PO TABS
150.0000 mg | ORAL_TABLET | Freq: Every day | ORAL | Status: DC
  Administered 2013-08-24: 150 mg via ORAL
  Filled 2013-08-24: qty 2

## 2013-08-24 MED ORDER — METRONIDAZOLE 500 MG PO TABS: 500.0000 mg | ORAL_TABLET | Freq: Two times a day (BID) | ORAL | Status: DC

## 2013-08-24 MED ORDER — HYDROCODONE-ACETAMINOPHEN 5-325 MG PO TABS: 1.0000 | ORAL_TABLET | Freq: Four times a day (QID) | ORAL | Status: DC | PRN

## 2013-08-24 MED ORDER — ONDANSETRON 4 MG PO TBDP
4.0000 mg | ORAL_TABLET | Freq: Once | ORAL | Status: AC
  Administered 2013-08-24: 4 mg via ORAL
  Filled 2013-08-24: qty 1

## 2013-08-24 NOTE — ED Notes (Addendum)
Presents with vaginal irritation and pain, thick white discharge and abdominal pain described as "hell" and constant, abdominal distention associated with nausea, has Mirena, but has not had normal period in many months. Reports finding lump in left breast and tenderness to left breast. Denies diarrhea. Normal BM today.  Abdominal pain began one year ago.

## 2013-08-24 NOTE — ED Provider Notes (Signed)
CSN: [id number]     Arrival date & time 08/24/13  1551 History   First MD Initiated Contact with Patient 08/24/13 1625     Chief Complaint  Patient presents with  . multiple complaints    (Consider location/radiation/quality/duration/timing/severity/associated sxs/prior Treatment) HPI  Vanessa Wise 27 year old presents emergency department chief complaint of pelvic pain and discharge.  Patient states that she has been with a single female partner however approximately 3 days ago she had a sexual encounter with an ex-boyfriend.  Since that time she has had increasing vaginal discomfort, dyspareunia, thick white vaginal discharge and pelvic pain with abdominal distention.  Patient also currently has Mirena IUD.  Patient denies urinary symptoms, patient does not have regular menstrual periods after I the insertion.  Patient denies any fevers, chills, nausea, vomiting or flank pain.  Past Medical History  Diagnosis Date  . Chlamydia infection 02/03/2012   No past surgical history on file. No family history on file. History  Substance Use Topics  . Smoking status: Current Every Day Smoker -- 1.00 packs/day    Types: Cigarettes  . Smokeless tobacco: Not on file  . Alcohol Use: Not on file   OB History   Grav Para Term Preterm Abortions TAB SAB Ect Mult Living                 Review of Systems  Allergies  Review of patient's allergies indicates no known allergies.  Home Medications   Current Outpatient Rx  Name  Route  Sig  Dispense  Refill  . BIOTIN PO   Oral   Take 1 tablet by mouth daily.         Marland Kitchen ibuprofen (ADVIL,MOTRIN) 200 MG tablet   Oral   Take 400 mg by mouth every 6 (six) hours as needed for pain.         Marland Kitchen levonorgestrel (MIRENA) 20 MCG/24HR IUD   Intrauterine   1 each by Intrauterine route once.            BP 110/68  Pulse 96  Temp(Src) 98.9 F (37.2 C) (Oral)  Resp 20  Wt 143 lb 6 oz (65.034 kg)  BMI 24.6 kg/m2  SpO2 98% Physical Exam   Constitutional: She is oriented to person, place, and time. She appears well-developed and well-nourished. No distress.  HENT:  Head: Normocephalic and atraumatic.  Eyes: Conjunctivae are normal. No scleral icterus.  Neck: Normal range of motion.  Cardiovascular: Normal rate, regular rhythm and normal heart sounds.  Exam reveals no gallop and no friction rub.   No murmur heard. Pulmonary/Chest: Effort normal and breath sounds normal. No respiratory distress.  Abdominal: Soft. Bowel sounds are normal. She exhibits no distension and no mass. There is no tenderness. There is no guarding.  Genitourinary:  Pelvic exam: VULVA: vulvar tenderness BL, vulvar edema BL, VAGINA: vaginal tenderness, vaginal discharge - white, copious and creamy, obtained, CERVIX: DNA probe for chlamydia and GC obtained, cervical motion tenderness present, UNABLE TO VISUALIZE OR PALPATE IUD STRINGS, ADNEXA: tenderness bilateral.   Neurological: She is alert and oriented to person, place, and time.  Skin: Skin is warm and dry. She is not diaphoretic.   Ten systems reviewed and are negative for acute change, except as noted in the HPI. \  ED Course  Procedures (including critical care time) Labs Review Labs Reviewed  URINALYSIS, ROUTINE W REFLEX MICROSCOPIC - Abnormal; Notable for the following:    APPearance HAZY (*)    Ketones, ur 15 (*)  All other components within normal limits  WET PREP, GENITAL  GC/CHLAMYDIA PROBE AMP  POCT PREGNANCY, URINE  POCT PREGNANCY, URINE   Imaging Review No results found.  EKG Interpretation   None       MDM  No diagnosis found. 5:13 PM BP 110/68  Pulse 96  Temp(Src) 98.9 F (37.2 C) (Oral)  Resp 20  Wt 143 lb 6 oz (65.034 kg)  BMI 24.6 kg/m2  SpO2 98% Patient examination concerning for pelvic inflammatory disease patient has been given ceftriaxone 250 mg IM.  Azithromycin 1 g orally and will be discharged with doxycycline 100 mg twice a day for 2 weeks.  Exam  unable to locate the patient's IUD I sent her for limited pelvic ultrasound to locate IUD.   7:44 PM I spoke with Dr. Erin Fulling who states that it is no longer recommended to pull the IUD with PID. Patient will be discharged with Doxycycline. F/U at [facility] Pain meds at dis[facility]  Arthor Captain, PA-C 08/24/13 1952

## 2013-08-25 LAB — GC/CHLAMYDIA PROBE AMP: GC Probe RNA: NEGATIVE

## 2013-08-25 MED ORDER — HYDROCODONE-ACETAMINOPHEN 5-325 MG PO TABS: 1.0000 | ORAL_TABLET | Freq: Four times a day (QID) | ORAL | Status: DC | PRN

## 2013-08-25 NOTE — ED Provider Notes (Signed)
Shared service with midlevel provider. I have personally seen and examined the patient, providing direct face to face care, presenting with the chief complaint of abdominal pain. Physical exam findings include lower quadrant abd pain, no peritoneal signs. Pt is diabetic, has PID, [place] ordered to r/o TOA. She has 3 SIRS criteria. Broad spectrum AB started. Plan will be to admit. I have reviewed the nursing documentation on past medical history, family history, and social history.   Derwood Kaplan, MD 08/25/13 1514

## 2013-08-26 NOTE — ED Notes (Signed)
+   Chlamydia Patient treated with Rocephin And Zithromax-[facility] faxed 

## 2013-08-27 ENCOUNTER — Ambulatory Visit (INDEPENDENT_AMBULATORY_CARE_PROVIDER_SITE_OTHER): Payer: 59 | Admitting: Family Medicine

## 2013-08-27 ENCOUNTER — Encounter: Payer: Self-pay | Admitting: Family Medicine

## 2013-08-27 VITALS — BP 119/73 | HR 76 | Temp 98.2°F | Wt 143.8 lb

## 2013-08-27 DIAGNOSIS — T8332XD Displacement of intrauterine contraceptive device, subsequent encounter: Secondary | ICD-10-CM

## 2013-08-27 DIAGNOSIS — Z5189 Encounter for other specified aftercare: Secondary | ICD-10-CM

## 2013-08-27 DIAGNOSIS — N63 Unspecified lump in unspecified breast: Secondary | ICD-10-CM

## 2013-08-27 DIAGNOSIS — N6310 Unspecified lump in the right breast, unspecified quadrant: Secondary | ICD-10-CM

## 2013-08-27 NOTE — Patient Instructions (Addendum)
I have placed an order for a breast ultrasound you will receive a call with date and place. Continue your antibiotic course and then come back for reevaluation of your pelvic inflammatory disease before any IUD removal.

## 2013-08-27 NOTE — Progress Notes (Signed)
Family Medicine Office Visit Note   Subjective:   Patient ID: Vanessa Wise, female  DOB: 1986-04-30, 27 y.o.. MRN: [id number]   Pt that comes today for IUD removal. She reports has 2 children but is actively trying to conceive again. Her major reason for discontinuing birth control is a side effects she thinks is from her mirena (IUD). She complains of being moody and tired. She was recently seen (3 days ago) in ED and diagnosed with pelvic inflammatory disease. She has been on doxycycline, metronidazole, and received one dose of ceftriaxone IM. She continues to have vaginal discharge, denies pelvic pain.   Her second complaint is regarding a mass on her right breast she noticed 3 days ago. Mass is somehow tender to palpation but does not hurt she does not touch it. There's no redness or swelling reported no nipple discharge. Patient reports no close family history of breast cancer. The patient is a smoker of a pack a day  Review of Systems:  Per history of present illness  Objective:   Physical Exam: Gen:  NAD HEENT: Moist mucous membranes  CV: Regular rate and rhythm, no murmurs PULM: Clear to auscultation bilaterally. Breasts: Right breast with a mass located at 6:00. Mass consistency is soft no adherent to deep planes. Mobile and is felt to about 3 cm diameter. No axillary adenopathy. Left breast is normal, no masses is palpated, no axillary adenopathy either. ABD: Soft, non tender, non distended, normal bowel sounds.  Vulva and perianal area: Normal  Speculum: Vagina and cervix of normal appearance, no friability, abundant yellowish vaginal discharge. Bimanual exam: Uterus anteverted, no adnexal masses. Mild cervical motion tenderness.   Assessment & Plan:

## 2013-08-27 NOTE — Assessment & Plan Note (Signed)
Patient with diagnosed of PID and is currently under treatment. Not  recommended IUD removal procedure at this time.   IUD strings were not visualized. Limited bedside ultrasound and was also unsuccessful locating IUD. This was discussed in May/2013 and patient has not followup appropriately.

## 2013-08-27 NOTE — Assessment & Plan Note (Signed)
Diagnostic right breast ultrasound. Followup with results

## 2013-08-28 ENCOUNTER — Telehealth (HOSPITAL_COMMUNITY): Payer: Self-pay | Admitting: Emergency Medicine

## 2013-08-29 NOTE — ED Notes (Signed)
Informed patient of +Chlamydia and appropriate treatment. Advised her to abstain from sex for 2 weeks and notify partner to get tested.

## 2013-11-11 ENCOUNTER — Other Ambulatory Visit: Payer: Self-pay | Admitting: Family Medicine

## 2013-11-11 ENCOUNTER — Ambulatory Visit (HOSPITAL_COMMUNITY)
Admission: RE | Admit: 2013-11-11 | Discharge: 2013-11-11 | Disposition: A | Discharge status: Home / Self Care | Payer: Medicaid Other | Source: Ambulatory Visit | Attending: Family Medicine | Admitting: Family Medicine

## 2013-11-11 ENCOUNTER — Ambulatory Visit (INDEPENDENT_AMBULATORY_CARE_PROVIDER_SITE_OTHER): Payer: Medicaid Other | Admitting: Family Medicine

## 2013-11-11 DIAGNOSIS — T8332XD Displacement of intrauterine contraceptive device, subsequent encounter: Secondary | ICD-10-CM

## 2013-11-11 DIAGNOSIS — R1909 Other intra-abdominal and pelvic swelling, mass and lump: Secondary | ICD-10-CM | POA: Insufficient documentation

## 2013-11-11 DIAGNOSIS — R109 Unspecified abdominal pain: Secondary | ICD-10-CM

## 2013-11-11 DIAGNOSIS — Z5189 Encounter for other specified aftercare: Secondary | ICD-10-CM

## 2013-11-11 LAB — POCT URINE PREGNANCY: Preg Test, Ur: NEGATIVE

## 2013-11-11 NOTE — Addendum Note (Signed)
Addended by: [place]ORDAN, Donnabelle Blanchard on: 11/11/2013 12:57 PM   Modules accepted: Orders

## 2013-11-12 ENCOUNTER — Telehealth: Payer: Self-pay | Admitting: Family Medicine

## 2013-11-12 NOTE — Assessment & Plan Note (Signed)
US on 10/22 visualized IUD. Strings not visible since 03/2012 evaluation. No proper f/u as recommended. GYN referral for eval and IUD removal.

## 2013-11-12 NOTE — Telephone Encounter (Signed)
The patient called this morning and was given her x-ray results. She was evidently quite upset that we had not Ardie called her and was upset that we could not get her an immediate referral to [facility] for 80 removal. Referral has been placed and she is aware of that.

## 2013-11-12 NOTE — Telephone Encounter (Signed)
Dear Cliffton AstersWhite Team Please call her and tell her IUD IS still there and then set her up at [facility] for IUD removal as we cannot see strings. THANKS! Denny LevySara Marjoria Mancillas

## 2013-11-12 NOTE — Progress Notes (Addendum)
Family Medicine Office Visit Note   Subjective:   Patient ID: Marga MelnickLashonda M Featherly, female  DOB: 04/27/1986, 28 y.o.. MRN: [id number]   Pt that comes today 40 min late for her appointment demanding IUD removal. Pt used vulgar language and verbally mistreated staff and me. She was seen last October in ED and in our office for IUD removal and this was not possible #1 due to strings not been visualized on exam and #2 she was diagnosed with PID at that time and was still under abx treatment. Pt did not f/u as recommended. We have reviewed her prior office notes and apparently this has been going on since May/2013 and pt was instructed to get xray and f/u. Xray was never performed.  It is documented in ultrasound done last October that IUD was in place and small amount of fluid was around it (at the same time she had the diagnosed PID). IUD was non visualized on bedside limited resolution ultrasound done on Oct 24/2014.   Today pt was offered a GYN referral since procedure needed to retrieve IUD without visible strings is beyond our scope of practice and she refused referral and demanded us to call [facility] and arrange IUD removal today in [facility].    We discussed long that this is a procedure that is not appropriate to be done ED settings and an evaluation is needed by GYN doctor as she may need more invasive procedure.   The proposed plan was not acceptable to the pt who asked to speak with our clinical director. After speaking with our clinical director pt agreed to plan to refer for evaluation/IUD removal to GYN.   Review of Systems:  She reported feeling of bloating, and that her IUD was in the way of having satisfying sexual intercourse, no due to pain but because her parter was feeling the IUD. Denies fever, chills, abdominal pain or vaginal discharge.   Objective:   Physical Exam: Pt declined exam today.  Assessment & Plan:

## 2013-11-12 NOTE — Telephone Encounter (Signed)
[facility] they will contact Pt.   Marines

## 2013-11-15 ENCOUNTER — Telehealth: Payer: Self-pay | Admitting: Family Medicine

## 2013-11-15 NOTE — Telephone Encounter (Signed)
Need to know if she needs to come in. Has been vomiting, shaking,  Please advise

## 2013-11-18 ENCOUNTER — Telehealth: Payer: Self-pay | Admitting: *Deleted

## 2013-11-18 NOTE — Telephone Encounter (Signed)
LVM for patient to call back. Dr. Aviva SignsPiloto has ordered a breast US and I wanted to know when a good time for her to get this is.

## 2014-01-04 ENCOUNTER — Emergency Department (HOSPITAL_COMMUNITY)
Admission: EM | Admit: 2014-01-04 | Discharge: 2014-01-04 | Discharge status: Against Medical Advice | Payer: Medicaid Other | Attending: Emergency Medicine | Admitting: Emergency Medicine

## 2014-01-04 ENCOUNTER — Encounter (HOSPITAL_COMMUNITY): Payer: Self-pay | Admitting: Emergency Medicine

## 2014-01-04 DIAGNOSIS — R141 Gas pain: Secondary | ICD-10-CM | POA: Insufficient documentation

## 2014-01-04 DIAGNOSIS — F17200 Nicotine dependence, unspecified, uncomplicated: Secondary | ICD-10-CM | POA: Insufficient documentation

## 2014-01-04 DIAGNOSIS — R1084 Generalized abdominal pain: Secondary | ICD-10-CM | POA: Insufficient documentation

## 2014-01-04 DIAGNOSIS — R142 Eructation: Secondary | ICD-10-CM | POA: Insufficient documentation

## 2014-01-04 DIAGNOSIS — R143 Flatulence: Secondary | ICD-10-CM

## 2014-01-04 LAB — URINALYSIS, ROUTINE W REFLEX MICROSCOPIC
Bilirubin Urine: NEGATIVE
GLUCOSE, UA: NEGATIVE mg/dL
HGB URINE DIPSTICK: NEGATIVE
KETONES UR: NEGATIVE mg/dL
LEUKOCYTES UA: NEGATIVE
Nitrite: NEGATIVE
PH: 6 (ref 5.0–8.0)
PROTEIN: NEGATIVE mg/dL
Specific Gravity, Urine: 1.028 (ref 1.005–1.030)
Urobilinogen, UA: 0.2 mg/dL (ref 0.0–1.0)

## 2014-01-04 LAB — CBC WITH DIFFERENTIAL/PLATELET
Basophils Absolute: 0 10*3/uL (ref 0.0–0.1)
Basophils Relative: 0 % (ref 0–1)
Eosinophils Absolute: 0.1 10*3/uL (ref 0.0–0.7)
Eosinophils Relative: 2 % (ref 0–5)
HCT: 37.1 % (ref 36.0–46.0)
HEMOGLOBIN: 12.6 g/dL (ref 12.0–15.0)
Lymphocytes Relative: 36 % (ref 12–46)
Lymphs Abs: 2.8 10*3/uL (ref 0.7–4.0)
MCH: 29.4 pg (ref 26.0–34.0)
MCHC: 34 g/dL (ref 30.0–36.0)
MCV: 86.7 fL (ref 78.0–100.0)
MONO ABS: 0.5 10*3/uL (ref 0.1–1.0)
MONOS PCT: 7 % (ref 3–12)
Neutro Abs: 4.2 10*3/uL (ref 1.7–7.7)
Neutrophils Relative %: 55 % (ref 43–77)
Platelets: 229 10*3/uL (ref 150–400)
RBC: 4.28 MIL/uL (ref 3.87–5.11)
RDW: 13.6 % (ref 11.5–15.5)
WBC: 7.6 10*3/uL (ref 4.0–10.5)

## 2014-01-04 LAB — COMPREHENSIVE METABOLIC PANEL
ALBUMIN: 3.6 g/dL (ref 3.5–5.2)
ALT: 15 U/L (ref 0–35)
AST: 18 U/L (ref 0–37)
Alkaline Phosphatase: 55 U/L (ref 39–117)
BUN: 11 mg/dL (ref 6–23)
CALCIUM: 9.7 mg/dL (ref 8.4–10.5)
CO2: 27 mEq/L (ref 19–32)
CREATININE: 0.86 mg/dL (ref 0.50–1.10)
Chloride: 102 mEq/L (ref 96–112)
GFR calc Af Amer: >90 mL/min (ref >90)
GFR calc non Af Amer: >90 mL/min (ref >90)
Glucose, Bld: 73 mg/dL (ref 70–99)
Potassium: 4.3 mEq/L (ref 3.7–5.3)
Sodium: 140 mEq/L (ref 137–147)
Total Bilirubin: <0.2 mg/dL — ABNORMAL LOW (ref 0.3–1.2)
Total Protein: 7.8 g/dL (ref 6.0–8.3)

## 2014-01-04 LAB — LIPASE, BLOOD: Lipase: 22 U/L (ref 11–59)

## 2014-01-04 LAB — POC URINE PREG, ED: Preg Test, Ur: NEGATIVE

## 2014-01-04 NOTE — ED Notes (Signed)
Pt approached desk. Asked how much longer. RN apologized for her long wait; explained that medical emergencies such as heart attacks and uncontrolled bleeding have had to be taken back due to medical necessity.  Pt became angry and asked how I knew her abdominal pain was not an emergency. RN explained that lab has not called to update her with any critical results. Pt walked away before RN could do anymore explaining.

## 2014-01-04 NOTE — ED Notes (Signed)
Pt asked to speak to CN. CN spoke with patient. Pt states she was frustrated with wait time, wanted to know why so many people has gone back before her. CN explained that patients are brought back based on acuity, and explained that there were only a few patients in front of her and that she should be getting a room shortly. Pt and mother seemed pleased with this answer, stated that they would continue to wait. Pt Shook CN hand and thanked him.

## 2014-01-04 NOTE — ED Notes (Addendum)
Pt c/o abd bloating and diffuse abd pain x 1 month. sts the pain is intermittent. Denies n/v/d. Pt reports she has gain a lot of weight and doesn't know why, sts she has been eating healthy. Denies urinary symptoms. Reports she is trying to get pregnant with her boyfriend, no longer using the IUD and unsure of LMP, reports she thinks she had some light pink spotting last month but not sure. Nad, skin warm and dry, resp e/u.

## 2014-01-04 NOTE — ED Notes (Signed)
Call x2

## 2014-01-04 NOTE — ED Notes (Signed)
Call x 1 no answer

## 2014-01-14 ENCOUNTER — Encounter: Payer: Self-pay | Admitting: Family Medicine

## 2014-03-19 ENCOUNTER — Inpatient Hospital Stay (HOSPITAL_COMMUNITY): Payer: Medicaid Other

## 2014-03-19 ENCOUNTER — Inpatient Hospital Stay (HOSPITAL_COMMUNITY)
Admission: AD | Admit: 2014-03-19 | Discharge: 2014-03-19 | Disposition: A | Discharge status: Home / Self Care | Payer: Medicaid Other | Source: Ambulatory Visit | Attending: Obstetrics & Gynecology | Admitting: Obstetrics & Gynecology

## 2014-03-19 ENCOUNTER — Encounter (HOSPITAL_COMMUNITY): Payer: Self-pay | Admitting: *Deleted

## 2014-03-19 DIAGNOSIS — R102 Pelvic and perineal pain: Secondary | ICD-10-CM

## 2014-03-19 DIAGNOSIS — N949 Unspecified condition associated with female genital organs and menstrual cycle: Secondary | ICD-10-CM

## 2014-03-19 DIAGNOSIS — K5900 Constipation, unspecified: Secondary | ICD-10-CM

## 2014-03-19 DIAGNOSIS — F17200 Nicotine dependence, unspecified, uncomplicated: Secondary | ICD-10-CM | POA: Diagnosis not present

## 2014-03-19 DIAGNOSIS — G8929 Other chronic pain: Secondary | ICD-10-CM | POA: Diagnosis not present

## 2014-03-19 DIAGNOSIS — K219 Gastro-esophageal reflux disease without esophagitis: Secondary | ICD-10-CM | POA: Diagnosis not present

## 2014-03-19 DIAGNOSIS — R140 Abdominal distension (gaseous): Secondary | ICD-10-CM

## 2014-03-19 DIAGNOSIS — R109 Unspecified abdominal pain: Secondary | ICD-10-CM | POA: Diagnosis present

## 2014-03-19 HISTORY — DX: Migraine, unspecified, not intractable, without status migrainosus: G43.909

## 2014-03-19 LAB — URINALYSIS, ROUTINE W REFLEX MICROSCOPIC
BILIRUBIN URINE: NEGATIVE
Glucose, UA: NEGATIVE mg/dL
Hgb urine dipstick: NEGATIVE
Ketones, ur: NEGATIVE mg/dL
Leukocytes, UA: NEGATIVE
Nitrite: NEGATIVE
PH: 6 (ref 5.0–8.0)
Protein, ur: NEGATIVE mg/dL
SPECIFIC GRAVITY, URINE: 1.025 (ref 1.005–1.030)
UROBILINOGEN UA: 0.2 mg/dL (ref 0.0–1.0)

## 2014-03-19 LAB — WET PREP, GENITAL
TRICH WET PREP: NONE SEEN
Yeast Wet Prep HPF POC: NONE SEEN

## 2014-03-19 LAB — CBC
HCT: 35.7 % — ABNORMAL LOW (ref 36.0–46.0)
HEMOGLOBIN: 12 g/dL (ref 12.0–15.0)
MCH: 29.3 pg (ref 26.0–34.0)
MCHC: 33.6 g/dL (ref 30.0–36.0)
MCV: 87.1 fL (ref 78.0–100.0)
PLATELETS: 194 10*3/uL (ref 150–400)
RBC: 4.1 MIL/uL (ref 3.87–5.11)
RDW: 13.1 % (ref 11.5–15.5)
WBC: 7 10*3/uL (ref 4.0–10.5)

## 2014-03-19 LAB — POCT PREGNANCY, URINE: Preg Test, Ur: NEGATIVE

## 2014-03-19 MED ORDER — RANITIDINE HCL 150 MG PO TABS: 150.0000 mg | ORAL_TABLET | Freq: Two times a day (BID) | ORAL | Status: DC

## 2014-03-19 MED ORDER — DOCUSATE SODIUM 100 MG PO CAPS: 100.0000 mg | ORAL_CAPSULE | Freq: Two times a day (BID) | ORAL | Status: DC | PRN

## 2014-03-19 MED ORDER — FAMOTIDINE 20 MG PO TABS
20.0000 mg | ORAL_TABLET | Freq: Once | ORAL | Status: AC
  Administered 2014-03-19: 20 mg via ORAL
  Filled 2014-03-19: qty 1

## 2014-03-19 MED ORDER — KETOROLAC TROMETHAMINE 60 MG/2ML IM SOLN
60.0000 mg | Freq: Once | INTRAMUSCULAR | Status: AC
  Administered 2014-03-19: 60 mg via INTRAMUSCULAR
  Filled 2014-03-19: qty 2

## 2014-03-19 NOTE — Discharge Instructions (Signed)
For stomach upset, avoid caffeine, alcohol, artificial sweeteners and dairy products. Try avoiding these for 2 weeks to see if symptoms resolve.  If improved, you may slowly add back each food in small doses to see if it causes discomfort.  Also, keep a food diary so you will know what you eat on days you have pain.    High-Fiber Diet Fiber is found in fruits, vegetables, and grains. A high-fiber diet encourages the addition of more whole grains, legumes, fruits, and vegetables in your diet. The recommended amount of fiber for adult males is 38 g per day. For adult females, it is 25 g per day. Pregnant and lactating women should get 28 g of fiber per day. If you have a digestive or bowel problem, ask your caregiver for advice before adding high-fiber foods to your diet. Eat a variety of high-fiber foods instead of only a select few type of foods.  PURPOSE  To increase stool bulk.  To make bowel movements more regular to prevent constipation.  To lower cholesterol.  To prevent overeating. WHEN IS THIS DIET USED?  It may be used if you have constipation and hemorrhoids.  It may be used if you have uncomplicated diverticulosis (intestine condition) and irritable bowel syndrome.  It may be used if you need help with weight management.  It may be used if you want to add it to your diet as a protective measure against atherosclerosis, diabetes, and cancer. SOURCES OF FIBER  Whole-grain breads and cereals.  Fruits, such as apples, oranges, bananas, berries, prunes, and pears.  Vegetables, such as green peas, carrots, sweet potatoes, beets, broccoli, cabbage, spinach, and artichokes.  Legumes, such split peas, soy, lentils.  Almonds. FIBER CONTENT IN FOODS Starches and Grains / Dietary Fiber (g)  Cheerios, 1 cup / 3 g  Corn Flakes cereal, 1 cup / 0.7 g  Rice crispy treat cereal, 1 cup / 0.3 g  Instant oatmeal (cooked),  cup / 2 g  Frosted wheat cereal, 1 cup / 5.1 g  Brown,  long-grain rice (cooked), 1 cup / 3.5 g  White, long-grain rice (cooked), 1 cup / 0.6 g  Enriched macaroni (cooked), 1 cup / 2.5 g Legumes / Dietary Fiber (g)  Baked beans (canned, plain, or vegetarian),  cup / 5.2 g  Kidney beans (canned),  cup / 6.8 g  Pinto beans (cooked),  cup / 5.5 g Breads and Crackers / Dietary Fiber (g)  Plain or honey graham crackers, 2 squares / 0.7 g  Saltine crackers, 3 squares / 0.3 g  Plain, salted pretzels, 10 pieces / 1.8 g  Whole-wheat bread, 1 slice / 1.9 g  White bread, 1 slice / 0.7 g  Raisin bread, 1 slice / 1.2 g  Plain bagel, 3 oz / 2 g  Flour tortilla, 1 oz / 0.9 g  Corn tortilla, 1 small / 1.5 g  Hamburger or hotdog bun, 1 small / 0.9 g Fruits / Dietary Fiber (g)  Apple with skin, 1 medium / 4.4 g  Sweetened applesauce,  cup / 1.5 g  Banana,  medium / 1.5 g  Grapes, 10 grapes / 0.4 g  Orange, 1 small / 2.3 g  Raisin, 1.5 oz / 1.6 g  Melon, 1 cup / 1.4 g Vegetables / Dietary Fiber (g)  Green beans (canned),  cup / 1.3 g  Carrots (cooked),  cup / 2.3 g  Broccoli (cooked),  cup / 2.8 g  Peas (cooked),  cup / 4.4  g  Mashed potatoes,  cup / 1.6 g  Lettuce, 1 cup / 0.5 g  Corn (canned),  cup / 1.6 g  Tomato,  cup / 1.1 g Document Released: 10/21/2005 Document Revised: 04/21/2012 Document Reviewed: 01/23/2012 [facility] Patient Information 2014 [place], [place].  Bloating Bloating is the feeling of fullness in your belly. You may feel as though your pants are too tight. Often the cause of bloating is overeating, retaining fluids, or having gas in your bowel. It is also caused by swallowing air and eating foods that cause gas. Irritable bowel syndrome is one of the most common causes of bloating. Constipation is also a common cause. Sometimes more serious problems can cause bloating. SYMPTOMS  Usually there is a feeling of fullness, as though your abdomen is bulged out. There may be mild discomfort.    DIAGNOSIS  Usually no particular testing is necessary for most bloating. If the condition persists and seems to become worse, your caregiver may do additional testing.  TREATMENT   There is no direct treatment for bloating.  Do not put gas into the bowel. Avoid chewing gum and sucking on candy. These tend to make you swallow air. Swallowing air can also be a nervous habit. Try to avoid this.  Avoiding high residue diets will help. Eat foods with soluble fibers (examples include root vegetables, apples, or barley) and substitute dairy products with soy and rice products. This helps irritable bowel syndrome.  If constipation is the cause, then a high residue diet with more fiber will help.  Avoid carbonated beverages.  Over-the-counter preparations are available that help reduce gas. Your pharmacist can help you with this. SEEK MEDICAL CARE IF:   Bloating continues and seems to be getting worse.  You notice a weight gain.  You have a weight loss but the bloating is getting worse.  You have changes in your bowel habits or develop nausea or vomiting. SEEK IMMEDIATE MEDICAL CARE IF:   You develop shortness of breath or swelling in your legs.  You have an increase in abdominal pain or develop chest pain. Document Released: 08/21/2006 Document Revised: 01/13/2012 Document Reviewed: 10/09/2007 [facility] Patient Information 2014 [place], [place].                No Primary Care Doctor:  To locate a primary care doctor that accepts your insurance or provides certain services:           [place] Connect: [phone]           Physician Referral Service: [phone] ask for My [place]   If no insurance, you need to see if you qualify for [facility] orange card, call to set      up appointment for eligibility/enrollment at [phone] or [phone] or visit [facility] of Health and [employer] ([address], [place] and [address] -[place]) to meet with a [facility]  enrollment specialist.  Agencies that provide inexpensive (sliding fee scale) medical care:        [facility] - Family Medicine at [place] - [phone]      [facility]  -  [facility] [phone]      [facility] Internal Medicine - [phone]      [facility] - [phone]      [facility] [phone]      [facility] [phone]   [facility] -  [facility] Child Health @ [place] [phone]   [facility] - [facility] @ [place] - [phone]   [facility]: [phone]    Women's Clinic: [phone]    Planned Parenthood: [phone]    [facility] of the [place]    Medicaid-accepting [facility] Providers:           [facility] [phone] (No Family Planning accepted)          [address], [phone], Mon-Fri 9am-5pm          [facility] - [phone]   [address] [place], [address] [place], [place] 8am-5pm, Fri 8am-noon   [employer] - [phone]          [address], [address], Mon-Fri 7:30am-4:30pm          [facility] - [phone]          [address] [place] 8am-5pm          [facility] - [phone] [address]          Only accepts [place] [employer] patients after they have their name applied to their card  Self Pay (no insurance) in [facility]:           Sickle Cell Patients:    [address] [place], [phone] [facility] Internal Medicine:   [address], [place] [phone]       [facility]   [address], [place] [phone]  [facility] Family Practice:   [address], [phone]          [facility]            [address] [place], [phone] [facility]   [address] [place], [phone]           [facility] [employer]           [address], [place] [phone]        [facility] - [address]           [phone], Mon-Fri 9am-7pm, [place] 9am-1pm          [facility] - Family Medicine @ [facility]          [address] [place], [phone]          [facility] - [facility]           [address], [phone] [facility] - [facility]   [address] [place] [phone]          [facility]           [address], [phone]  [facility] - [facility]    [address] [place], [phone] [facility] - [facility]   [address] [phone]  Dr. Julio Sicks           [address], [place], [phone]  [facility]           [address], [phone]          [facility]             [address], [phone]          [facility]           [address], [phone], 1st & 3rd Saturday every month, 10am-1pm  OTHERS:  [employer]  [employer](Immigration Access Clinic Only)  [phone] (Thursday only)  Strategies for finding a Primary Care Provider:  1) Find a Doctor and Pay Out of Pocket  Although you won't have to find out who is covered by your insurance plan, it is a good idea to ask around and get recommendations. You will then need to call the office and see if the doctor you have chosen will accept you as a new patient and what types of options they offer for patients who are self-pay. Some doctors offer discounts or will set up payment plans for their patients who do not have insurance, but you will need to ask so you aren't  surprised when you get to your appointment.  2) Contact [facility] - To see if you qualify for orange card access to healthcare safety net providers.  Call for appointment for eligibility/enrollment at [phone] or [phone]. (Uninsured, 0-200% [employer], qualifying info)  Applicants for [facility] are first required to see if they are eligible to enroll in the [facility] Marketplace before enrolling in [facility] (and get an exemption if they are not).  GCCN Criteria for acceptance is:    Proof of [employer] exemption - form or documentation    Valid photo ID (driver's license, state identification card, passport, home country ID)    Proof of [facility] [place] residency (e.g. drivers license, lease/landlord information, pay stubs with address, utility bill, bank statement, etc.)    Proof of income (1040, last year's tax return, W2, 4 current pay stubs, other income proof)    Proof of assets (current bank statement + 3 most recent, disability paperwork, life insurance info, tax value on autos, etc.)  3) Contact Your Local Health Department  Not all health departments have doctors that can see patients for sick visits, but many do, so it is worth a call to see if yours does. If you don't know where your local health department is, you can check in your phone book. The CDC also has a tool to help you locate your state's health department, and many state websites also have listings of all of their local health departments.  4) Find a Walk-in Clinic  If your illness is not likely to be very severe or complicated, you may want to try a walk in clinic. These are popping up all over the country in pharmacies, drugstores, and shopping centers. They're usually staffed by nurse practitioners or physician assistants that have been trained to treat common illnesses and complaints. They're usually fairly quick and inexpensive. However, if you have serious medical issues or chronic medical problems, these are  probably not your best option

## 2014-03-19 NOTE — MAU Note (Signed)
Pt. Has been having pain in her abdomen for 5-796months. Has [facility] and they told her she was constipated. But patient feels she is not. Pain is in lower right side of her abdomen. Pt. Also states she is nauseated. Denies vomiting.

## 2014-03-19 NOTE — MAU Provider Note (Signed)
History     CSN: [id number]  Arrival date and time: 03/19/14 1735   First Provider Initiated Contact with Patient 03/19/14 1846      Chief Complaint  Patient presents with  . Abdominal Pain   HPI  Vanessa Wise is a 28 y.o. female G2P0 who presents with abdominal pain that has been going on for 5-6 months. The pain occurs everyday and the pain comes and goes. The patient is concerned because her kids have been missing school due to her abdominal pain. The patient does not have regular menstrual cycles; and has not had a regular cycle since she had her IUD placed. She feels like her stomach is enlarged and swollen. Last intercourse was yesterday and it was somewhat painful. The patient has been with the same partner for 13 years; recently was treated for chlamydia. The last few exams she has had with her PCP IUD strings have not been visualized. The patient has not opted to have the IUD out, although multiple notes recommend it out.   Pt last ate at 11:00 and she ate bacon, eggs and chocolate chip waffles. Pt has not been able to eat another meal today because she feels full.  The patient has taken extra strength tylenol today without relief.     OB History   Grav Para Term Preterm Abortions TAB SAB Ect Mult Living   2         2      Past Medical History  Diagnosis Date  . Chlamydia infection 02/03/2012  . Migraines     Past Surgical History  Procedure Laterality Date  . No past surgeries      History reviewed. No pertinent family history.  History  Substance Use Topics  . Smoking status: Current Every Day Smoker -- 1.00 packs/day    Types: Cigarettes  . Smokeless tobacco: Not on file  . Alcohol Use: Yes     Comment: occasionally    Allergies: No Known Allergies  Facility-administered medications prior to admission  Medication Dose Route Frequency Provider Last Rate Last Dose  . azithromycin (ZITHROMAX) powder 1 g  1 g Oral Once Rodolph BongEvan S Corey, MD        Prescriptions prior to admission  Medication Sig Dispense Refill  . acetaminophen (TYLENOL) 500 MG tablet Take 1,000 mg by mouth every 6 (six) hours as needed for mild pain.       Marland Kitchen. ibuprofen (ADVIL,MOTRIN) 200 MG tablet Take 400 mg by mouth every 6 (six) hours as needed for pain.      Marland Kitchen. levonorgestrel (MIRENA) 20 MCG/24HR IUD 1 each by Intrauterine route once.         Results for orders placed during the hospital encounter of 03/19/14 (from the past 48 hour(s))  URINALYSIS, ROUTINE W REFLEX MICROSCOPIC     Status: None   Collection Time    03/19/14  5:50 PM      Result Value Ref Range   Color, Urine YELLOW  YELLOW   APPearance CLEAR  CLEAR   Specific Gravity, Urine 1.025  1.005 - 1.030   pH 6.0  5.0 - 8.0   Glucose, UA NEGATIVE  NEGATIVE mg/dL   Hgb urine dipstick NEGATIVE  NEGATIVE   Bilirubin Urine NEGATIVE  NEGATIVE   Ketones, ur NEGATIVE  NEGATIVE mg/dL   Protein, ur NEGATIVE  NEGATIVE mg/dL   Urobilinogen, UA 0.2  0.0 - 1.0 mg/dL   Nitrite NEGATIVE  NEGATIVE   Leukocytes, UA NEGATIVE  NEGATIVE   Comment: MICROSCOPIC NOT DONE ON URINES WITH NEGATIVE PROTEIN, BLOOD, LEUKOCYTES, NITRITE, OR GLUCOSE <1000 mg/dL.  POCT PREGNANCY, URINE     Status: None   Collection Time    03/19/14  5:59 PM      Result Value Ref Range   Preg Test, Ur NEGATIVE  NEGATIVE   Comment:            THE SENSITIVITY OF THIS     METHODOLOGY IS >24 mIU/mL  CBC     Status: Abnormal   Collection Time    03/19/14  6:45 PM      Result Value Ref Range   WBC 7.0  4.0 - 10.5 K/uL   RBC 4.10  3.87 - 5.11 MIL/uL   Hemoglobin 12.0  12.0 - 15.0 g/dL   HCT 16.1 (*) 09.6 - 04.5 %   MCV 87.1  78.0 - 100.0 fL   MCH 29.3  26.0 - 34.0 pg   MCHC 33.6  30.0 - 36.0 g/dL   RDW 40.9  81.1 - 91.4 %   Platelets 194  150 - 400 K/uL  WET PREP, GENITAL     Status: Abnormal   Collection Time    03/19/14  7:18 PM      Result Value Ref Range   Yeast Wet Prep HPF POC NONE SEEN  NONE SEEN   Trich, Wet Prep NONE SEEN  NONE  SEEN   Clue Cells Wet Prep HPF POC FEW (*) NONE SEEN   WBC, Wet Prep HPF POC FEW (*) NONE SEEN   Comment: MODERATE BACTERIA SEEN    Review of Systems  Constitutional: Positive for chills. Negative for fever.  Gastrointestinal: Positive for nausea and abdominal pain (The abdominal pain is everywhere on her stomach. worse at the top of her stomach. ). Negative for vomiting, diarrhea and constipation.  Genitourinary: Negative for dysuria, urgency, frequency and hematuria.   Physical Exam   Blood pressure 115/71, pulse 93, temperature 98.8 F (37.1 C), temperature source Oral, resp. rate 16.  Physical Exam  Constitutional: She is oriented to person, place, and time. She appears well-developed and well-nourished. No distress.  HENT:  Head: Normocephalic.  Eyes: Pupils are equal, round, and reactive to light.  Neck: Neck supple.  GI: Soft. Normal appearance. There is tenderness in the epigastric area. There is no rigidity.  Genitourinary: Vaginal discharge found.  Speculum exam: Vagina - Large amount of creamy, white discharge, no odor. Did not visualize IUD strings  Cervix - No contact bleeding, no active bleeding  Bimanual exam: Cervix closed, significant CMT  Uterus non tender, normal size; slightly enlarged  Adnexa non tender, no masses bilaterally GC/Chlam, wet prep done Chaperone present for exam.   Musculoskeletal: Normal range of motion.  Neurological: She is alert and oriented to person, place, and time.  Skin: Skin is warm. She is not diaphoretic.  Psychiatric: Her behavior is normal.    MAU Course  Procedures none  MDM UA Upt  Wet prep GC CBC HIV antibody   Pt is requesting something to eat while in MAU Toradol Pepcid  Pain is more upper gastric; pt drinks 6-7 sodas per day  Assessment and Plan   A:  Chronic pelvic pain in female  GERD     Vanessa Wise 03/19/2014, 6:57 PM   US Transvaginal Non-ob  03/19/2014   CLINICAL DATA:  Right-sided  pelvic pain, unknown IUD location  EXAM: TRANSABDOMINAL AND TRANSVAGINAL ULTRASOUND OF PELVIS  TECHNIQUE: Both transabdominal  and transvaginal ultrasound examinations of the pelvis were performed. Transabdominal technique was performed for global imaging of the pelvis including uterus, ovaries, adnexal regions, and pelvic cul-de-sac. It was necessary to proceed with endovaginal exam following the transabdominal exam to visualize the endometrium and ovaries.  COMPARISON:  DG ABD 1 VIEW dated 11/11/2013  FINDINGS: Uterus  Measurements: 9.5 x 7.1 x 4.2 cm. Anteverted, anteflexed. No fibroids or other mass visualized.  Endometrium  Thickness: Not measurable, uniformly thin and obscured by IUD properly located within the uterine fundal/ body endometrial canal. No focal abnormality visualized.  Right ovary  Measurements: 3.6 x 2.7 x 2.0 cm. Normal appearance/no adnexal mass.  Left ovary  Measurements: 3.0 x 2.3 x 1.6 cm. Normal appearance/no adnexal mass.  Other findings  No free fluid.  IMPRESSION: Normal exam. IUD appropriately located within the uterine fundal/ body endometrial canal.   Electronically Signed   By: Christiana PellantGretchen  Green M.D.   On: 03/19/2014 20:26   [place] Pelvis Complete  03/19/2014   CLINICAL DATA:  Right-sided pelvic pain, unknown IUD location  EXAM: TRANSABDOMINAL AND TRANSVAGINAL ULTRASOUND OF PELVIS  TECHNIQUE: Both transabdominal and transvaginal ultrasound examinations of the pelvis were performed. Transabdominal technique was performed for global imaging of the pelvis including uterus, ovaries, adnexal regions, and pelvic cul-de-sac. It was necessary to proceed with endovaginal exam following the transabdominal exam to visualize the endometrium and ovaries.  COMPARISON:  DG ABD 1 VIEW dated 11/11/2013  FINDINGS: Uterus  Measurements: 9.5 x 7.1 x 4.2 cm. Anteverted, anteflexed. No fibroids or other mass visualized.  Endometrium  Thickness: Not measurable, uniformly thin and obscured by IUD properly located  within the uterine fundal/ body endometrial canal. No focal abnormality visualized.  Right ovary  Measurements: 3.6 x 2.7 x 2.0 cm. Normal appearance/no adnexal mass.  Left ovary  Measurements: 3.0 x 2.3 x 1.6 cm. Normal appearance/no adnexal mass.  Other findings  No free fluid.  IMPRESSION: Normal exam. IUD appropriately located within the uterine fundal/ body endometrial canal.   Electronically Signed   By: Christiana PellantGretchen  Green M.D.   On: 03/19/2014 20:26    Of note, preliminary u/s report mentions large amount of stool and fluid in loops of bowel.   A: 1. Chronic pelvic pain in female   2. Constipation   3. Abdominal bloating    P D/C home Dietary teaching done regarding increasing fluids and fiber.  May want to avoid caffeine and dairy to see if symptoms improve also. Colace 100 mg BID Zantac 150 mg BID Pt given information on finding a primary care provider F/U in Gyn clinic PRN  Return to MAU as needed for emergencies  Sharen CounterLisa Leftwich-Kirby Certified Nurse-Midwife

## 2014-03-20 LAB — HIV ANTIBODY (ROUTINE TESTING W REFLEX): HIV: NONREACTIVE

## 2014-03-21 LAB — GC/CHLAMYDIA PROBE AMP
CT Probe RNA: NEGATIVE
GC Probe RNA: NEGATIVE

## 2014-03-23 NOTE — Telephone Encounter (Signed)
error 

## 2014-09-05 ENCOUNTER — Encounter (HOSPITAL_COMMUNITY): Payer: Self-pay | Admitting: *Deleted

## 2014-11-29 ENCOUNTER — Other Ambulatory Visit (HOSPITAL_COMMUNITY)
Admission: RE | Admit: 2014-11-29 | Discharge: 2014-11-29 | Disposition: A | Discharge status: Home / Self Care | Payer: Medicaid Other | Source: Ambulatory Visit | Attending: Family Medicine | Admitting: Family Medicine

## 2014-11-29 ENCOUNTER — Ambulatory Visit (INDEPENDENT_AMBULATORY_CARE_PROVIDER_SITE_OTHER): Payer: Medicaid Other | Admitting: Family Medicine

## 2014-11-29 ENCOUNTER — Encounter: Payer: Self-pay | Admitting: Family Medicine

## 2014-11-29 VITALS — BP 112/74 | HR 75 | Temp 98.0°F | Ht 65.0 in | Wt 150.0 lb

## 2014-11-29 DIAGNOSIS — Z30432 Encounter for removal of intrauterine contraceptive device: Secondary | ICD-10-CM | POA: Diagnosis present

## 2014-11-29 DIAGNOSIS — T8332XA Displacement of intrauterine contraceptive device, initial encounter: Secondary | ICD-10-CM

## 2014-11-29 DIAGNOSIS — Z01419 Encounter for gynecological examination (general) (routine) without abnormal findings: Secondary | ICD-10-CM | POA: Insufficient documentation

## 2014-11-29 DIAGNOSIS — Z124 Encounter for screening for malignant neoplasm of cervix: Secondary | ICD-10-CM

## 2014-11-29 NOTE — Progress Notes (Signed)
Patient seen for IUD removal due to worsening of IBS and pelvic pain.  Patient had been evaluated at other office - strings not seen.  US had been done in 5/15 showing IUD in uterus.    IUD Removal  Patient was in the dorsal lithotomy position, normal external genitalia was noted.  A speculum was placed in the patient's vagina, normal discharge was noted, no lesions. The multiparous cervix was visualized, no lesions, no abnormal discharge.  The strings of the IUD were not seen.  A cytobrush was inserted into the cervix in attempt to grasp the strings, but was unsuccessful.  An IUD removal hook was then inserted into the uterus, but was not able to grasp the IUD.  The procedure was then stopped.     Will get US to assure the IUD is still present.  If it is, will schedule the patient for hysteroscopy and removal.  A PAP was also obtained.

## 2014-11-29 NOTE — Progress Notes (Signed)
Patient here today for removal of IUD. Had it placed March 2015 at [facility].

## 2014-12-01 LAB — CYTOLOGY - PAP

## 2014-12-08 ENCOUNTER — Other Ambulatory Visit: Payer: Self-pay | Admitting: Family Medicine

## 2014-12-08 ENCOUNTER — Ambulatory Visit (HOSPITAL_COMMUNITY)
Admission: RE | Admit: 2014-12-08 | Discharge: 2014-12-08 | Disposition: A | Discharge status: Home / Self Care | Payer: Medicaid Other | Source: Ambulatory Visit | Attending: Family Medicine | Admitting: Family Medicine

## 2014-12-08 DIAGNOSIS — T8332XA Displacement of intrauterine contraceptive device, initial encounter: Secondary | ICD-10-CM

## 2014-12-08 DIAGNOSIS — N949 Unspecified condition associated with female genital organs and menstrual cycle: Secondary | ICD-10-CM | POA: Insufficient documentation

## 2014-12-08 DIAGNOSIS — N8320 Unspecified ovarian cysts: Secondary | ICD-10-CM | POA: Insufficient documentation

## 2014-12-08 DIAGNOSIS — Z975 Presence of (intrauterine) contraceptive device: Secondary | ICD-10-CM | POA: Diagnosis not present

## 2014-12-16 ENCOUNTER — Encounter (HOSPITAL_COMMUNITY): Payer: Self-pay

## 2014-12-26 ENCOUNTER — Encounter (HOSPITAL_COMMUNITY): Payer: Self-pay | Admitting: Anesthesiology

## 2014-12-26 NOTE — Anesthesia Preprocedure Evaluation (Deleted)
Anesthesia Evaluation  Patient identified by MRN, date of birth, ID band Patient awake    Reviewed: Allergy & Precautions, NPO status , Patient's Chart, lab work & pertinent test results  History of Anesthesia Complications Negative for: history of anesthetic complications  Airway Mallampati: II  TM Distance: >3 FB Neck ROM: Full    Dental no notable dental hx. (+) Dental Advisory Given   Pulmonary Current Smoker,  breath sounds clear to auscultation  Pulmonary exam normal       Cardiovascular negative cardio ROS  Rhythm:Regular Rate:Normal     Neuro/Psych  Headaches, PSYCHIATRIC DISORDERS Anxiety    GI/Hepatic negative GI ROS, Neg liver ROS,   Endo/Other  negative endocrine ROS  Renal/GU negative Renal ROS  negative genitourinary   Musculoskeletal negative musculoskeletal ROS (+)   Abdominal   Peds negative pediatric ROS (+)  Hematology negative hematology ROS (+)   Anesthesia Other Findings   Reproductive/Obstetrics negative OB ROS                             Anesthesia Physical Anesthesia Plan  ASA: II  Anesthesia Plan: MAC   Post-op Pain Management:    Induction: Intravenous  Airway Management Planned:   Additional Equipment:   Intra-op Plan:   Post-operative Plan:   Informed Consent: I have reviewed the patients History and Physical, chart, labs and discussed the procedure including the risks, benefits and alternatives for the proposed anesthesia with the patient or authorized representative who has indicated his/her understanding and acceptance.   Dental advisory given  Plan Discussed with: CRNA  Anesthesia Plan Comments:         Anesthesia Quick Evaluation

## 2014-12-27 ENCOUNTER — Encounter (HOSPITAL_COMMUNITY)
Admission: RE | Disposition: A | Discharge status: Home / Self Care | Payer: Self-pay | Source: Ambulatory Visit | Attending: Obstetrics & Gynecology

## 2014-12-27 ENCOUNTER — Ambulatory Visit (HOSPITAL_COMMUNITY)
Admission: RE | Admit: 2014-12-27 | Discharge: 2014-12-27 | Disposition: A | Discharge status: Home / Self Care | Payer: Medicaid Other | Source: Ambulatory Visit | Attending: Obstetrics & Gynecology | Admitting: Obstetrics & Gynecology

## 2014-12-27 HISTORY — DX: Other seasonal allergic rhinitis: J30.2

## 2014-12-27 HISTORY — DX: Anxiety disorder, unspecified: F41.9

## 2014-12-27 HISTORY — DX: Irritable bowel syndrome, unspecified: K58.9

## 2014-12-27 SURGERY — REMOVAL, INTRAUTERINE DEVICE
Anesthesia: Monitor Anesthesia Care | Site: Vagina

## 2014-12-27 SURGICAL SUPPLY — 12 items
CATH ROBINSON RED A/P 16FR (CATHETERS) IMPLANT
CLOTH BEACON ORANGE TIMEOUT ST (SAFETY) IMPLANT
CONTAINER PREFILL 10% NBF 60ML (FORM) IMPLANT
GLOVE BIO SURGEON STRL SZ7 (GLOVE) IMPLANT
GLOVE BIOGEL PI IND STRL 7.0 (GLOVE) IMPLANT
GLOVE BIOGEL PI INDICATOR 7.0 (GLOVE)
GOWN STRL REUS W/TWL LRG LVL3 (GOWN DISPOSABLE) IMPLANT
NEEDLE SPNL 22GX3.5 QUINCKE BK (NEEDLE) IMPLANT
PACK VAGINAL MINOR WOMEN LF (CUSTOM PROCEDURE TRAY) IMPLANT
PAD PREP 24X48 CUFFED NSTRL (MISCELLANEOUS) IMPLANT
TOWEL OR 17X24 6PK STRL BLUE (TOWEL DISPOSABLE) IMPLANT
WATER STERILE IRR 1000ML POUR (IV SOLUTION) IMPLANT

## 2014-12-28 ENCOUNTER — Encounter: Payer: Self-pay | Admitting: *Deleted

## 2014-12-29 ENCOUNTER — Encounter (HOSPITAL_COMMUNITY): Payer: Self-pay | Admitting: *Deleted

## 2014-12-30 ENCOUNTER — Telehealth: Payer: Self-pay | Admitting: General Practice

## 2014-12-30 NOTE — Telephone Encounter (Signed)
Patient called and left message stating she needs us to fax something to her job stating that she has had all these appointments for her pain and that she will have to have surgery. Called patient, no answer- Left message stating we are trying to return your phone call please call us back at the clinics and press option 3 for the nurse line

## 2015-01-03 NOTE — Telephone Encounter (Signed)
Called pt and pt sounded upset she informed me that she originally had her surgery scheduled for the 01/03/15 but was moved to 01/17/15.  Pt stated that she just started a new job and they have been getting on to her about the change in surgery date.  Pt requested that she gets a doctor's note indicating that due to her having abdominal pain she may miss work at times.  I informed pt that I would need to speak with provider who will be doing her surgery to see what restrictions if any should be in a letter to be sent to her job.  I informed pt that a provider has not given a medical reasoning for her to miss work.  I advised pt that I will get in touch with the provider, Harraway-Smith, and call her by the end of the day.  Pt agreed.

## 2015-01-03 NOTE — Telephone Encounter (Signed)
Per Dr. Burnice LoganHarraway- Katrinka BlazingSmith pt can not have a doctor's note to cover her if she has to miss work due to abdominal pain.  Called pt and was unable to leave message due to VM box full.

## 2015-01-05 NOTE — Telephone Encounter (Signed)
Called pt and left message on her personal voice mail that Dr. Erin FullingHarraway-Smith cannot give her a letter excusing her from work for abdominal pain. This is because we do not give work excuse letters unless we have seen the pt and know what their status is at the time. She may call back if she has additional questions.

## 2015-01-17 ENCOUNTER — Ambulatory Visit (HOSPITAL_COMMUNITY): Payer: Medicaid Other | Admitting: Anesthesiology

## 2015-01-17 ENCOUNTER — Encounter (HOSPITAL_COMMUNITY): Payer: Self-pay | Admitting: Anesthesiology

## 2015-01-17 ENCOUNTER — Ambulatory Visit (HOSPITAL_COMMUNITY)
Admission: RE | Admit: 2015-01-17 | Discharge: 2015-01-17 | Disposition: A | Discharge status: Home / Self Care | Payer: Medicaid Other | Source: Ambulatory Visit | Attending: Obstetrics & Gynecology | Admitting: Obstetrics & Gynecology

## 2015-01-17 ENCOUNTER — Encounter (HOSPITAL_COMMUNITY)
Admission: RE | Disposition: A | Discharge status: Home / Self Care | Payer: Self-pay | Source: Ambulatory Visit | Attending: Obstetrics & Gynecology

## 2015-01-17 DIAGNOSIS — F419 Anxiety disorder, unspecified: Secondary | ICD-10-CM | POA: Diagnosis not present

## 2015-01-17 DIAGNOSIS — G43909 Migraine, unspecified, not intractable, without status migrainosus: Secondary | ICD-10-CM | POA: Insufficient documentation

## 2015-01-17 DIAGNOSIS — Y838 Other surgical procedures as the cause of abnormal reaction of the patient, or of later complication, without mention of misadventure at the time of the procedure: Secondary | ICD-10-CM | POA: Diagnosis not present

## 2015-01-17 DIAGNOSIS — Z79899 Other long term (current) drug therapy: Secondary | ICD-10-CM | POA: Insufficient documentation

## 2015-01-17 DIAGNOSIS — T8384XA Pain from genitourinary prosthetic devices, implants and grafts, initial encounter: Secondary | ICD-10-CM

## 2015-01-17 DIAGNOSIS — F17210 Nicotine dependence, cigarettes, uncomplicated: Secondary | ICD-10-CM | POA: Insufficient documentation

## 2015-01-17 DIAGNOSIS — Z30432 Encounter for removal of intrauterine contraceptive device: Secondary | ICD-10-CM | POA: Insufficient documentation

## 2015-01-17 DIAGNOSIS — T8384XS Pain from genitourinary prosthetic devices, implants and grafts, sequela: Secondary | ICD-10-CM | POA: Diagnosis not present

## 2015-01-17 HISTORY — PX: HYSTEROSCOPY: SHX211

## 2015-01-17 HISTORY — DX: Pain due to genitourinary prosthetic devices, implants and grafts, initial encounter: T83.84XA

## 2015-01-17 LAB — PREGNANCY, URINE: PREG TEST UR: NEGATIVE

## 2015-01-17 LAB — CBC
HCT: 38.9 % (ref 36.0–46.0)
Hemoglobin: 12.8 g/dL (ref 12.0–15.0)
MCH: 28.8 pg (ref 26.0–34.0)
MCHC: 32.9 g/dL (ref 30.0–36.0)
MCV: 87.6 fL (ref 78.0–100.0)
PLATELETS: 209 10*3/uL (ref 150–400)
RBC: 4.44 MIL/uL (ref 3.87–5.11)
RDW: 13.7 % (ref 11.5–15.5)
WBC: 6.8 10*3/uL (ref 4.0–10.5)

## 2015-01-17 SURGERY — HYSTEROSCOPY
Anesthesia: Monitor Anesthesia Care | Site: Vagina

## 2015-01-17 MED ORDER — SODIUM CHLORIDE 0.9 % IR SOLN
Status: DC | PRN
  Administered 2015-01-17: 3000 mL

## 2015-01-17 MED ORDER — LIDOCAINE HCL (CARDIAC) 20 MG/ML IV SOLN
INTRAVENOUS | Status: AC
  Filled 2015-01-17: qty 5

## 2015-01-17 MED ORDER — FENTANYL CITRATE 0.05 MG/ML IJ SOLN
INTRAMUSCULAR | Status: DC | PRN
  Administered 2015-01-17: 50 ug via INTRAVENOUS
  Administered 2015-01-17: 25 ug via INTRAVENOUS
  Administered 2015-01-17: 50 ug via INTRAVENOUS

## 2015-01-17 MED ORDER — ONDANSETRON HCL 4 MG/2ML IJ SOLN
INTRAMUSCULAR | Status: DC | PRN
  Administered 2015-01-17: 4 mg via INTRAVENOUS

## 2015-01-17 MED ORDER — LACTATED RINGERS IV SOLN: INTRAVENOUS | Status: DC

## 2015-01-17 MED ORDER — ONDANSETRON HCL 4 MG/2ML IJ SOLN
INTRAMUSCULAR | Status: AC
  Filled 2015-01-17: qty 2

## 2015-01-17 MED ORDER — PROPOFOL 10 MG/ML IV BOLUS
INTRAVENOUS | Status: DC | PRN
  Administered 2015-01-17: 200 mg via INTRAVENOUS

## 2015-01-17 MED ORDER — KETOROLAC TROMETHAMINE 30 MG/ML IJ SOLN
INTRAMUSCULAR | Status: AC
  Filled 2015-01-17: qty 1

## 2015-01-17 MED ORDER — SCOPOLAMINE 1 MG/3DAYS TD PT72
1.0000 | MEDICATED_PATCH | Freq: Once | TRANSDERMAL | Status: DC
  Administered 2015-01-17: 1.5 mg via TRANSDERMAL

## 2015-01-17 MED ORDER — BUPIVACAINE HCL (PF) 0.5 % IJ SOLN
INTRAMUSCULAR | Status: AC
  Filled 2015-01-17: qty 30

## 2015-01-17 MED ORDER — METOCLOPRAMIDE HCL 5 MG/ML IJ SOLN: 10.0000 mg | Freq: Once | INTRAMUSCULAR | Status: DC | PRN

## 2015-01-17 MED ORDER — SCOPOLAMINE 1 MG/3DAYS TD PT72: 1.0000 | MEDICATED_PATCH | Freq: Once | TRANSDERMAL | Status: DC

## 2015-01-17 MED ORDER — LIDOCAINE HCL (CARDIAC) 20 MG/ML IV SOLN
INTRAVENOUS | Status: DC | PRN
  Administered 2015-01-17: 60 mg via INTRAVENOUS

## 2015-01-17 MED ORDER — SCOPOLAMINE 1 MG/3DAYS TD PT72
MEDICATED_PATCH | TRANSDERMAL | Status: AC
  Filled 2015-01-17: qty 1

## 2015-01-17 MED ORDER — LACTATED RINGERS IV SOLN
INTRAVENOUS | Status: DC
Start: 2015-01-17 — End: 2015-01-17

## 2015-01-17 MED ORDER — MIDAZOLAM HCL 2 MG/2ML IJ SOLN
INTRAMUSCULAR | Status: AC
  Filled 2015-01-17: qty 2

## 2015-01-17 MED ORDER — FENTANYL CITRATE 0.05 MG/ML IJ SOLN
INTRAMUSCULAR | Status: AC
  Filled 2015-01-17: qty 2

## 2015-01-17 MED ORDER — KETOROLAC TROMETHAMINE 30 MG/ML IJ SOLN
INTRAMUSCULAR | Status: DC | PRN
  Administered 2015-01-17: 30 mg via INTRAVENOUS

## 2015-01-17 MED ORDER — FENTANYL CITRATE 0.05 MG/ML IJ SOLN: 25.0000 ug | INTRAMUSCULAR | Status: DC | PRN

## 2015-01-17 MED ORDER — BUPIVACAINE HCL (PF) 0.5 % IJ SOLN
INTRAMUSCULAR | Status: DC | PRN
  Administered 2015-01-17: 10 mL

## 2015-01-17 MED ORDER — DEXAMETHASONE SODIUM PHOSPHATE 10 MG/ML IJ SOLN
INTRAMUSCULAR | Status: DC | PRN
  Administered 2015-01-17: 4 mg via INTRAVENOUS

## 2015-01-17 MED ORDER — LACTATED RINGERS IV SOLN
INTRAVENOUS | Status: DC
  Administered 2015-01-17 (×2): via INTRAVENOUS

## 2015-01-17 MED ORDER — DEXAMETHASONE SODIUM PHOSPHATE 4 MG/ML IJ SOLN
INTRAMUSCULAR | Status: AC
  Filled 2015-01-17: qty 1

## 2015-01-17 MED ORDER — FENTANYL CITRATE 0.05 MG/ML IJ SOLN
INTRAMUSCULAR | Status: AC
Start: 2015-01-17 — End: 2015-01-17
  Filled 2015-01-17: qty 2

## 2015-01-17 MED ORDER — MEPERIDINE HCL 25 MG/ML IJ SOLN
6.2500 mg | INTRAMUSCULAR | Status: DC | PRN
Start: 2015-01-17 — End: 2015-01-17

## 2015-01-17 MED ORDER — MIDAZOLAM HCL 2 MG/2ML IJ SOLN
INTRAMUSCULAR | Status: DC | PRN
  Administered 2015-01-17: 2 mg via INTRAVENOUS

## 2015-01-17 MED ORDER — PROPOFOL 10 MG/ML IV BOLUS
INTRAVENOUS | Status: AC
  Filled 2015-01-17: qty 20

## 2015-01-17 SURGICAL SUPPLY — 14 items
CATH ROBINSON RED A/P 16FR (CATHETERS) ×3 IMPLANT
CLOTH BEACON ORANGE TIMEOUT ST (SAFETY) ×3 IMPLANT
CONTAINER PREFILL 10% NBF 60ML (FORM) IMPLANT
GLOVE BIO SURGEON STRL SZ7 (GLOVE) ×3 IMPLANT
GLOVE BIOGEL PI IND STRL 7.0 (GLOVE) ×1 IMPLANT
GLOVE BIOGEL PI INDICATOR 7.0 (GLOVE) ×2
GOWN STRL REUS W/TWL LRG LVL3 (GOWN DISPOSABLE) ×6 IMPLANT
IV NS IRRIG 3000ML ARTHROMATIC (IV SOLUTION) ×3 IMPLANT
PACK VAGINAL MINOR WOMEN LF (CUSTOM PROCEDURE TRAY) ×3 IMPLANT
PAD OB MATERNITY 4.3X12.25 (PERSONAL CARE ITEMS) ×3 IMPLANT
TOWEL OR 17X24 6PK STRL BLUE (TOWEL DISPOSABLE) ×6 IMPLANT
TUBING AQUILEX INFLOW (TUBING) ×3 IMPLANT
TUBING AQUILEX OUTFLOW (TUBING) ×3 IMPLANT
WATER STERILE IRR 1000ML POUR (IV SOLUTION) ×3 IMPLANT

## 2015-01-17 NOTE — Discharge Instructions (Signed)
Hysteroscopy, Care After °Refer to this sheet in the next few weeks. These instructions provide you with information on caring for yourself after your procedure. Your health care provider may also give you more specific instructions. Your treatment has been planned according to current medical practices, but problems sometimes occur. Call your health care provider if you have any problems or questions after your procedure.  °WHAT TO EXPECT AFTER THE PROCEDURE °After your procedure, it is typical to have the following: °· You may have some cramping. This normally lasts for a couple days. °· You may have bleeding. This can vary from light spotting for a few days to menstrual-like bleeding for 3-7 days. °HOME CARE INSTRUCTIONS °· Rest for the first 1-2 days after the procedure. °· Only take over-the-counter or prescription medicines as directed by your health care provider. Do not take aspirin. It can increase the chances of bleeding. °· Take showers instead of baths for 2 weeks or as directed by your health care provider. °· Do not drive for 24 hours or as directed. °· Do not drink alcohol while taking pain medicine. °· Do not use tampons, douche, or have sexual intercourse for 2 weeks or until your health care provider says it is okay. °· Take your temperature twice a day for 4-5 days. Write it down each time. °· Follow your health care provider's advice about diet, exercise, and lifting. °· If you develop constipation, you may: °¨ Take a mild laxative if your health care provider approves. °¨ Add bran foods to your diet. °¨ Drink enough fluids to keep your urine clear or pale yellow. °· Try to have someone with you or available to you for the first 24-48 hours, especially if you were given a general anesthetic. °· Follow up with your health care provider as directed. °SEEK MEDICAL CARE IF: °· You feel dizzy or lightheaded. °· You feel sick to your stomach (nauseous). °· You have abnormal vaginal discharge. °· You  have a rash. °· You have pain that is not controlled with medicine. °SEEK IMMEDIATE MEDICAL CARE IF: °· You have bleeding that is heavier than a normal menstrual period. °· You have a fever. °· You have increasing cramps or pain, not controlled with medicine. °· You have new belly (abdominal) pain. °· You pass out. °· You have pain in the tops of your shoulders (shoulder strap areas). °· You have shortness of breath. °Document Released: 08/11/2013 Document Reviewed: 08/11/2013 °[employer]® Patient Information ©2015 [employer]. This information is not intended to replace advice given to you by your health care provider. Make sure you discuss any questions you have with your health care provider. ° °

## 2015-01-17 NOTE — Brief Op Note (Signed)
01/17/2015  11:39 AM  PATIENT:  Marga MelnickLashonda M Rago  29 y.o. female  PRE-OPERATIVE DIAGNOSIS:  Trapped intra uterine device  POST-OPERATIVE DIAGNOSIS:  Trapped intra uterine device  PROCEDURE:  Procedure(s): HYSTEROSCOPY WITH REMOVAL INTRAUTERINE DIVICE (N/A)  SURGEON:  Surgeon(s) and Role:    * Willodean Rosenthalarolyn Harraway-Smith, MD - Primary    ANESTHESIA:   general and paracervical block  EBL:  Total I/O In: 700 [I.V.:700] Out: 202 [Urine:200; Blood:2]  BLOOD ADMINISTERED:none  DRAINS: none   LOCAL MEDICATIONS USED:  MARCAINE     SPECIMEN:  Source of Specimen:  IUD  DISPOSITION OF SPECIMEN:  PATHOLOGY  COUNTS:  YES  TOURNIQUET:  * No tourniquets in log *  DICTATION: .Note written in EPIC  PLAN OF CARE: Discharge to home after PACU  PATIENT DISPOSITION:  PACU - hemodynamically stable.   Delay start of Pharmacological VTE agent (>24hrs) due to surgical blood loss or risk of bleeding: not applicable  Complications: none immediate

## 2015-01-17 NOTE — Transfer of Care (Signed)
Immediate Anesthesia Transfer of Care Note  Patient: Vanessa Wise  Procedure(s) Performed: Procedure(s): HYSTEROSCOPY WITH REMOVAL INTRAUTERINE DIVICE (N/A)  Patient Location: PACU  Anesthesia Type:General  Level of Consciousness: awake, alert , oriented and patient cooperative  Airway & Oxygen Therapy: Patient Spontanous Breathing and Patient connected to nasal cannula oxygen  Post-op Assessment: Report given to RN and Post -op Vital signs reviewed and stable  Post vital signs: Reviewed and stable  Last Vitals:  Filed Vitals:   01/17/15 0902  BP: 114/71  Pulse: 74  Temp: 36.6 C  Resp: 18    Complications: No apparent anesthesia complications

## 2015-01-17 NOTE — Anesthesia Procedure Notes (Signed)
Procedure Name: LMA Insertion Date/Time: 01/17/2015 10:54 AM Performed by: Yolonda KidaARVER, Kayliah Tindol L Pre-anesthesia Checklist: Patient identified, Emergency Drugs available, Suction available and Patient being monitored Patient Re-evaluated:Patient Re-evaluated prior to inductionOxygen Delivery Method: Circle system utilized Preoxygenation: Pre-oxygenation with 100% oxygen Intubation Type: IV induction Ventilation: Mask ventilation without difficulty LMA: LMA inserted LMA Size: 4.0 Number of attempts: 1 Placement Confirmation: positive ETCO2,  CO2 detector and breath sounds checked- equal and bilateral Tube secured with: Tape Dental Injury: Teeth and Oropharynx as per pre-operative assessment

## 2015-01-17 NOTE — H&P (Signed)
HPI  Ms. Vanessa Wise is a 29 y.o. female G2P2 who presents with abdominal pain that has been going on for 5-6 months. The pain occurs everyday and the pain comes and goes. The patient is concerned because her kids have been missing school due to her abdominal pain. The patient does not have regular menstrual cycles; and has not had a regular cycle since she had her IUD placed. She feels like her stomach is enlarged and swollen. Last intercourse was yesterday and it was somewhat painful. The patient has been with the same partner for 13 years; recently was treated for chlamydia. The last few exams she has had with her PCP IUD strings have not been visualized. Pt presents today to have the IUD removed.   OB History   Grav Para Term Preterm Abortions TAB SAB Ect Mult Living   2         2      Past Medical History  Diagnosis Date  . Chlamydia infection 02/03/2012  . Migraines     Past Surgical History  Procedure Laterality Date  . No past surgeries      History reviewed. No pertinent family history.  History  Substance Use Topics  . Smoking status: Current Every Day Smoker -- 1.00 packs/day    Types: Cigarettes  . Smokeless tobacco: Not on file  . Alcohol Use: Yes     Comment: occasionally    Allergies: No Known Allergies  Facility-administered medications prior to admission  Medication Dose Route Frequency Provider Last Rate Last Dose  . azithromycin (ZITHROMAX) powder 1 g 1 g Oral Once Rodolph Bong, MD     Prescriptions prior to admission  Medication Sig Dispense Refill  . acetaminophen (TYLENOL) 500 MG tablet Take 1,000 mg by mouth every 6 (six) hours as needed for mild pain.     Marland Kitchen ibuprofen (ADVIL,MOTRIN) 200 MG tablet Take 400 mg by mouth every 6 (six) hours as needed for pain.    Marland Kitchen levonorgestrel (MIRENA) 20 MCG/24HR IUD 1 each by Intrauterine route once.        Lab Results Last 48 Hours    Results for orders placed during the hospital encounter of 03/19/14 (from the past 48 hour(s))  URINALYSIS, ROUTINE W REFLEX MICROSCOPIC Status: None   Collection Time   03/19/14 5:50 PM   Result Value Ref Range   Color, Urine YELLOW  YELLOW   APPearance CLEAR  CLEAR   Specific Gravity, Urine 1.025  1.005 - 1.030   pH 6.0  5.0 - 8.0   Glucose, UA NEGATIVE  NEGATIVE mg/dL   Hgb urine dipstick NEGATIVE  NEGATIVE   Bilirubin Urine NEGATIVE  NEGATIVE   Ketones, ur NEGATIVE  NEGATIVE mg/dL   Protein, ur NEGATIVE  NEGATIVE mg/dL   Urobilinogen, UA 0.2  0.0 - 1.0 mg/dL   Nitrite NEGATIVE  NEGATIVE   Leukocytes, UA NEGATIVE  NEGATIVE   Comment: MICROSCOPIC NOT DONE ON URINES WITH NEGATIVE PROTEIN, BLOOD, LEUKOCYTES, NITRITE, OR GLUCOSE <1000 mg/dL.  POCT PREGNANCY, URINE Status: None   Collection Time   03/19/14 5:59 PM   Result Value Ref Range   Preg Test, Ur NEGATIVE  NEGATIVE   Comment:      THE SENSITIVITY OF THIS    METHODOLOGY IS >24 mIU/mL  CBC Status: Abnormal   Collection Time   03/19/14 6:45 PM   Result Value Ref Range   WBC 7.0  4.0 - 10.5 K/uL   RBC 4.10  3.87 -  5.11 MIL/uL   Hemoglobin 12.0  12.0 - 15.0 g/dL   HCT 96.2 (*) 95.2 - 84.1 %   MCV 87.1  78.0 - 100.0 fL   MCH 29.3  26.0 - 34.0 pg   MCHC 33.6  30.0 - 36.0 g/dL   RDW 32.4  40.1 - 02.7 %   Platelets 194  150 - 400 K/uL  WET PREP, GENITAL Status: Abnormal   Collection Time   03/19/14 7:18 PM   Result Value Ref Range   Yeast Wet Prep HPF POC NONE SEEN  NONE SEEN   Trich, Wet Prep NONE SEEN  NONE SEEN   Clue Cells Wet Prep HPF POC FEW (*) NONE SEEN   WBC, Wet Prep HPF POC FEW (*) NONE SEEN   Comment: MODERATE BACTERIA SEEN      Review of Systems  Constitutional: Positive  for chills. Negative for fever.  Gastrointestinal: Positive for nausea and abdominal pain (The abdominal pain is everywhere on her stomach. worse at the top of her stomach. ). Negative for vomiting, diarrhea and constipation.  Genitourinary: Negative for dysuria, urgency, frequency and hematuria.   Physical Exam  BP 114/71 mmHg  Pulse 74  Temp(Src) 97.9 F (36.6 C) (Oral)  Resp 18  Ht  (1.651 m)  Wt 150 lb (68.04 kg)  BMI 24.96 kg/m2  SpO2 99%   Physical Exam  Constitutional: She is oriented to person, place, and time. She appears well-developed and well-nourished. No distress.  HENT:  Head: Normocephalic.  Eyes: Pupils are equal, round, and reactive to light.  Neck: Neck supple.  GI: Soft. Normal appearance. There is tenderness in the epigastric area. There is no rigidity.  Genitourinary: Vaginal discharge found.  Speculum exam: Vagina - Large amount of creamy, white discharge, no odor. Did not visualize IUD strings  Cervix - No contact bleeding, no active bleeding  Bimanual exam: Cervix closed, significant CMT  Uterus non tender, normal size; slightly enlarged  Adnexa non tender, no masses bilaterally GC/Chlam, wet prep done Chaperone present for exam.  Musculoskeletal: Normal range of motion.  Neurological: She is alert and oriented to person, place, and time.  Skin: Skin is warm. She is not diaphoretic.  Psychiatric: Her behavior is normal.         Imaging Results (Last 48 hours)    US Transvaginal Non-ob  03/19/2014 CLINICAL DATA: Right-sided pelvic pain, unknown IUD location EXAM: TRANSABDOMINAL AND TRANSVAGINAL ULTRASOUND OF PELVIS TECHNIQUE: Both transabdominal and transvaginal ultrasound examinations of the pelvis were performed. Transabdominal technique was performed for global imaging of the pelvis including uterus, ovaries, adnexal regions, and pelvic cul-de-sac. It was necessary to proceed with endovaginal exam following the transabdominal exam  to visualize the endometrium and ovaries. COMPARISON: DG ABD 1 VIEW dated 11/11/2013 FINDINGS: Uterus Measurements: 9.5 x 7.1 x 4.2 cm. Anteverted, anteflexed. No fibroids or other mass visualized. Endometrium Thickness: Not measurable, uniformly thin and obscured by IUD properly located within the uterine fundal/ body endometrial canal. No focal abnormality visualized. Right ovary Measurements: 3.6 x 2.7 x 2.0 cm. Normal appearance/no adnexal mass. Left ovary Measurements: 3.0 x 2.3 x 1.6 cm. Normal appearance/no adnexal mass. Other findings No free fluid. IMPRESSION: Normal exam. IUD appropriately located within the uterine fundal/ body endometrial canal. Electronically Signed By: Christiana Pellant M.D. On: 03/19/2014 20:26   US Pelvis Complete  03/19/2014 CLINICAL DATA: Right-sided pelvic pain, unknown IUD location EXAM: TRANSABDOMINAL AND TRANSVAGINAL ULTRASOUND OF PELVIS TECHNIQUE: Both transabdominal and transvaginal ultrasound examinations of the  pelvis were performed. Transabdominal technique was performed for global imaging of the pelvis including uterus, ovaries, adnexal regions, and pelvic cul-de-sac. It was necessary to proceed with endovaginal exam following the transabdominal exam to visualize the endometrium and ovaries. COMPARISON: DG ABD 1 VIEW dated 11/11/2013 FINDINGS: Uterus Measurements: 9.5 x 7.1 x 4.2 cm. Anteverted, anteflexed. No fibroids or other mass visualized. Endometrium Thickness: Not measurable, uniformly thin and obscured by IUD properly located within the uterine fundal/ body endometrial canal. No focal abnormality visualized. Right ovary Measurements: 3.6 x 2.7 x 2.0 cm. Normal appearance/no adnexal mass. Left ovary Measurements: 3.0 x 2.3 x 1.6 cm. Normal appearance/no adnexal mass. Other findings No free fluid. IMPRESSION: Normal exam. IUD appropriately located within the uterine fundal/ body endometrial canal. Electronically Signed By:  Christiana PellantGretchen Green M.D. On: 03/19/2014 20:26       A: 1. Chronic pelvic pain in female - pt for IUD removal       Plan: Patient desires surgical management with hysteroscopic removal of IUD.  The risks of surgery were discussed in detail with the patient including but not limited to: bleeding which may require transfusion or reoperation; infection which may require prolonged hospitalization or re-hospitalization and antibiotic therapy; injury to bowel, bladder, ureters and major vessels or other surrounding organs; need for additional procedures including laparotomy; thromboembolic phenomenon, incisional problems and other postoperative or anesthesia complications.  Patient was told that the likelihood that her condition and symptoms will be treated effectively with this surgical management was very high; the postoperative expectations were also discussed in detail. The patient also understands the alternative treatment options which were discussed in full. All questions were answered.

## 2015-01-17 NOTE — Anesthesia Postprocedure Evaluation (Signed)
  Anesthesia Post-op Note  Patient: Vanessa Wise  Procedure(s) Performed: Procedure(s): HYSTEROSCOPY WITH REMOVAL INTRAUTERINE DIVICE (N/A)  Patient Location: PACU  Anesthesia Type:General  Level of Consciousness: awake, alert  and oriented  Airway and Oxygen Therapy: Patient Spontanous Breathing  Post-op Pain: none  Post-op Assessment: Post-op Vital signs reviewed, Patient's Cardiovascular Status Stable, Respiratory Function Stable, Patent Airway, No signs of Nausea or vomiting and Pain level controlled  Post-op Vital Signs: Reviewed and stable  Last Vitals:  Filed Vitals:   01/17/15 1145  BP: 97/56  Pulse: 66  Temp:   Resp: 16    Complications: No apparent anesthesia complications

## 2015-01-17 NOTE — Op Note (Signed)
01/17/2015  11:39 AM  PATIENT:  Vanessa MelnickLashonda M Borger  29 y.o. female  PRE-OPERATIVE DIAGNOSIS:  Trapped intra uterine device  POST-OPERATIVE DIAGNOSIS:  Trapped intra uterine device  PROCEDURE:  Procedure(s): HYSTEROSCOPY WITH REMOVAL INTRAUTERINE DIVICE (N/A)  SURGEON:  Surgeon(s) and Role:    * Willodean Rosenthalarolyn Harraway-Smith, MD - Primary    ANESTHESIA:   general and paracervical block  EBL:  Total I/O In: 700 [I.V.:700] Out: 202 [Urine:200; Blood:2]  BLOOD ADMINISTERED:none  DRAINS: none   LOCAL MEDICATIONS USED:  MARCAINE     SPECIMEN:  Source of Specimen:  IUD  DISPOSITION OF SPECIMEN:  PATHOLOGY  COUNTS:  YES  TOURNIQUET:  * No tourniquets in log *  DICTATION: .Note written in EPIC  PLAN OF CARE: Discharge to home after PACU  PATIENT DISPOSITION:  PACU - hemodynamically stable.   Delay start of Pharmacological VTE agent (>24hrs) due to surgical blood loss or risk of bleeding: not applicable  Complications: none immediate  INDICATIONS: 29 y.o. G2P2  here for scheduled surgery for hysteroscopy with removal of IUD.   Risks of surgery were discussed with the patient including but not limited to: bleeding which may require transfusion; infection which may require antibiotics; injury to uterus or surrounding organs; intrauterine scarring which may impair future fertility; need for additional procedures including laparotomy or laparoscopy; and other postoperative/anesthesia complications. Written informed consent was obtained.    FINDINGS:  A 8 week size uterus.  Diffuse proliferative endometrium.  Normal ostia bilaterally.    PROCEDURE DETAILS:  The patient was taken to the operating room where general anesthesia was administered with LMA and was found to be adequate.  After an adequate timeout was performed, she was placed in the dorsal lithotomy position and examined; then prepped and draped in the sterile manner.   Her bladder was catheterized for an unmeasured amount of  clear, yellow urine. A speculum was then placed in the patient's vagina and a single tooth tenaculum was applied to the anterior lip of the cervix.   A paracervical block was performed using 10cc of  0.5% Marcaine. The cervix was sounded to 8cm and dilated manually with metal dilators to accommodate the 5 mm diagnostic hysteroscope.  Once the cervix was dilated, the hysteroscope was inserted under direct visualization using 1.5% glycine as a suspension medium.  The uterine cavity was carefully examined, both ostia were recognized, and diffusely proliferative endometrium was noted.   The IUD was found and removed using grasping forceps. After further careful visualization of the uterine cavity, the hysteroscope was removed under direct visualization.  The tenaculum was removed from the anterior lip of the cervix and the vaginal speculum was removed after noting good hemostasis.  The patient tolerated the procedure well and was taken to the recovery area awake, extubated and in stable condition.    The patient will be discharged to home as per PACU criteria.  Routine postoperative instructions given.  She was prescribed Percocet, Ibuprofen and Colace.  She will follow up in the clinic in 6 weeks for postoperative evaluation.

## 2015-01-17 NOTE — Anesthesia Preprocedure Evaluation (Signed)
Anesthesia Evaluation  Patient identified by MRN, date of birth, ID band Patient awake    Reviewed: Allergy & Precautions, NPO status , Patient's Chart, lab work & pertinent test results  Airway Mallampati: II  TM Distance: >3 FB Neck ROM: Full    Dental no notable dental hx. (+) Teeth Intact   Pulmonary Current Smoker,  breath sounds clear to auscultation  Pulmonary exam normal       Cardiovascular negative cardio ROS  Rhythm:Regular Rate:Normal     Neuro/Psych  Headaches, PSYCHIATRIC DISORDERS Anxiety    GI/Hepatic negative GI ROS, (+)     substance abuse  marijuana use,   Endo/Other  negative endocrine ROS  Renal/GU negative Renal ROS  negative genitourinary   Musculoskeletal   Abdominal   Peds  Hematology negative hematology ROS (+)   Anesthesia Other Findings   Reproductive/Obstetrics Trapped/Embedded IUD                             Anesthesia Physical Anesthesia Plan  ASA: II  Anesthesia Plan: MAC   Post-op Pain Management:    Induction: Intravenous  Airway Management Planned:   Additional Equipment:   Intra-op Plan:   Post-operative Plan: Extubation in OR  Informed Consent: I have reviewed the patients History and Physical, chart, labs and discussed the procedure including the risks, benefits and alternatives for the proposed anesthesia with the patient or authorized representative who has indicated his/her understanding and acceptance.   Dental advisory given  Plan Discussed with: Anesthesiologist, CRNA and Surgeon  Anesthesia Plan Comments:         Anesthesia Quick Evaluation

## 2015-01-18 ENCOUNTER — Encounter (HOSPITAL_COMMUNITY): Payer: Self-pay | Admitting: Obstetrics & Gynecology

## 2015-02-20 ENCOUNTER — Ambulatory Visit: Payer: Medicaid Other | Admitting: Obstetrics & Gynecology

## 2015-06-30 ENCOUNTER — Emergency Department (HOSPITAL_COMMUNITY)
Admission: EM | Admit: 2015-06-30 | Discharge: 2015-06-30 | Disposition: A | Discharge status: Home / Self Care | Payer: Medicaid Other | Attending: Emergency Medicine | Admitting: Emergency Medicine

## 2015-06-30 ENCOUNTER — Encounter (HOSPITAL_COMMUNITY): Payer: Self-pay | Admitting: Emergency Medicine

## 2015-06-30 DIAGNOSIS — Z8619 Personal history of other infectious and parasitic diseases: Secondary | ICD-10-CM | POA: Diagnosis not present

## 2015-06-30 DIAGNOSIS — R197 Diarrhea, unspecified: Secondary | ICD-10-CM | POA: Insufficient documentation

## 2015-06-30 DIAGNOSIS — Z3202 Encounter for pregnancy test, result negative: Secondary | ICD-10-CM | POA: Insufficient documentation

## 2015-06-30 DIAGNOSIS — Z8659 Personal history of other mental and behavioral disorders: Secondary | ICD-10-CM | POA: Diagnosis not present

## 2015-06-30 DIAGNOSIS — R112 Nausea with vomiting, unspecified: Secondary | ICD-10-CM | POA: Diagnosis not present

## 2015-06-30 DIAGNOSIS — Z8679 Personal history of other diseases of the circulatory system: Secondary | ICD-10-CM | POA: Insufficient documentation

## 2015-06-30 DIAGNOSIS — Z720 Tobacco use: Secondary | ICD-10-CM | POA: Diagnosis not present

## 2015-06-30 LAB — URINE MICROSCOPIC-ADD ON

## 2015-06-30 LAB — PREGNANCY, URINE: Preg Test, Ur: NEGATIVE

## 2015-06-30 LAB — URINALYSIS, ROUTINE W REFLEX MICROSCOPIC
BILIRUBIN URINE: NEGATIVE
GLUCOSE, UA: NEGATIVE mg/dL
KETONES UR: NEGATIVE mg/dL
Leukocytes, UA: NEGATIVE
NITRITE: NEGATIVE
PH: 7 (ref 5.0–8.0)
PROTEIN: NEGATIVE mg/dL
Specific Gravity, Urine: 1.027 (ref 1.005–1.030)
Urobilinogen, UA: 1 mg/dL (ref 0.0–1.0)

## 2015-06-30 MED ORDER — ONDANSETRON 8 MG PO TBDP: 8.0000 mg | ORAL_TABLET | Freq: Three times a day (TID) | ORAL | Status: DC | PRN

## 2015-06-30 MED ORDER — ONDANSETRON 4 MG PO TBDP
4.0000 mg | ORAL_TABLET | Freq: Once | ORAL | Status: AC
  Administered 2015-06-30: 4 mg via ORAL
  Filled 2015-06-30: qty 1

## 2015-06-30 NOTE — ED Provider Notes (Signed)
CSN: [id number]     Arrival date & time 06/30/15  4098 History   First MD Initiated Contact with Patient 06/30/15 [phone]     Chief Complaint  Patient presents with  . Nausea     (Consider location/radiation/quality/duration/timing/severity/associated sxs/prior Treatment) The history is provided by the patient.   patient presents with nausea vomiting diarrhea. Began this morning. States she feels very lightheaded. States she feels if she stood up she would pass out. No sick contacts. States she's had some chills. States she's been feeling a little fatigue otherwise. States has had a little bit of a cough and some left-sided chest pain. She states basically all she's been doing is trying to get pregnant. States she's felt a little bad since she had her IUD removed around 5 months ago. It had migrated and had been removed surgically. Denies vaginal bleeding or discharge at this time. Denies sick contacts.  Past Medical History  Diagnosis Date  . Chlamydia infection 02/03/2012  . Migraines   . Anxiety   . Seasonal allergies   . IBS (irritable bowel syndrome)    Past Surgical History  Procedure Laterality Date  . No past surgeries    . Mouth surgery    . Hysteroscopy N/A 01/17/2015    Procedure: HYSTEROSCOPY WITH REMOVAL INTRAUTERINE DIVICE;  Surgeon: Willodean Rosenthal, MD;  Location: [facility];  Service: Gynecology;  Laterality: N/A;   No family history on file. Social History  Substance Use Topics  . Smoking status: Current Every Day Smoker -- 1.00 packs/day for 9 years    Types: Cigarettes  . Smokeless tobacco: Never Used  . Alcohol Use: No     Comment: rare   OB History    Gravida Para Term Preterm AB TAB SAB Ectopic Multiple Living   2         2     Review of Systems  Constitutional: Positive for chills and fatigue. Negative for activity change and appetite change.  Eyes: Negative for pain.  Respiratory: Positive for cough. Negative for chest tightness and shortness of  breath.   Cardiovascular: Positive for chest pain. Negative for leg swelling.  Gastrointestinal: Positive for nausea, vomiting and diarrhea. Negative for abdominal pain.  Genitourinary: Negative for flank pain.  Musculoskeletal: Negative for back pain and neck stiffness.  Skin: Negative for rash.  Neurological: Negative for weakness, numbness and headaches.  Psychiatric/Behavioral: Negative for behavioral problems.      Allergies  Review of patient's allergies indicates no known allergies.  Home Medications   Prior to Admission medications   Medication Sig Start Date End Date Taking? Authorizing Provider  acetaminophen (TYLENOL) 325 MG tablet Take 650 mg by mouth every 6 (six) hours as needed for mild pain.   Yes Historical Provider, MD  ibuprofen (ADVIL,MOTRIN) 200 MG tablet Take 400-600 mg by mouth every 6 (six) hours as needed for moderate pain (depends on pain level if patient takes 400 mg or 600 mg).    Yes Historical Provider, MD  ranitidine (ZANTAC) 150 MG tablet Take 150 mg by mouth 2 (two) times daily as needed for heartburn.   Yes Historical Provider, MD  ondansetron (ZOFRAN-ODT) 8 MG disintegrating tablet Take 1 tablet (8 mg total) by mouth every 8 (eight) hours as needed for nausea or vomiting. 06/30/15   Benjiman Core, MD   BP 119/50 mmHg  Pulse 60  Temp(Src) 98.2 F (36.8 C) (Oral)  Resp 14  Ht  (1.626 m)  Wt 150 lb (68.04 kg)  BMI 25.73 kg/m2  SpO2 100%  LMP 06/26/2015 Physical Exam  Constitutional: She is oriented to person, place, and time. She appears well-developed and well-nourished.  HENT:  Head: Normocephalic and atraumatic.  Neck: Normal range of motion.  Cardiovascular: Normal rate, regular rhythm and normal heart sounds.   No murmur heard. Pulmonary/Chest: Effort normal and breath sounds normal. No respiratory distress. She has no wheezes.  Abdominal: Soft. Bowel sounds are normal. She exhibits no distension. There is no tenderness. There is  no rebound.  Musculoskeletal: Normal range of motion.  Neurological: She is alert and oriented to person, place, and time. No cranial nerve deficit.  Skin: Skin is warm and dry.  Psychiatric: Her speech is normal.  Nursing note and vitals reviewed.   ED Course  Procedures (including critical care time) Labs Review Labs Reviewed  URINALYSIS, ROUTINE W REFLEX MICROSCOPIC (NOT AT [facility]) - Abnormal; Notable for the following:    APPearance CLOUDY (*)    Hgb urine dipstick LARGE (*)    All other components within normal limits  URINE MICROSCOPIC-ADD ON - Abnormal; Notable for the following:    Squamous Epithelial / LPF MANY (*)    Bacteria, UA MANY (*)    All other components within normal limits  WET PREP, GENITAL  URINE CULTURE  PREGNANCY, URINE  RPR  HIV ANTIBODY (ROUTINE TESTING)  GC/CHLAMYDIA PROBE AMP ([place]) NOT AT [facility]    Imaging Review No results found. I have personally reviewed and evaluated these images and lab results as part of my medical decision-making.   EKG Interpretation   Date/Time:  Friday June 30 2015 09:23:43 EDT Ventricular Rate:  63 PR Interval:  135 QRS Duration: 83 QT Interval:  415 QTC Calculation: 425 R Axis:   44 Text Interpretation:  Sinus rhythm Confirmed by Rubin Payor  MD, Harrold Donath  [phone]) on 06/30/2015 9:40:04 AM      MDM   Final diagnoses:  Nausea vomiting and diarrhea    Patient with nausea vomiting diarrhea. No dysuria and urine cultures sent due to some abnormalities. Patient    stated along the ER that she wanted to be checked for STDs however she refused pelvic exam. GC and Chlamydia were not done but RPR and HIV were sent. Will discharge home.   Benjiman Core, MD 06/30/15 [phone]

## 2015-06-30 NOTE — ED Notes (Signed)
Pt reports that she does not want to have a pelvic exam done here. Pt reports she will follow up with her PCP. MD notified.

## 2015-06-30 NOTE — ED Notes (Signed)
Pt states nausea is better. NAD at this time.

## 2015-06-30 NOTE — ED Notes (Signed)
Pt states episodes of nausea and fatigue since surgery to remove IUD. Pt states surgery was in "Mar or May" of 2016. Pt states had 3 episodes of vomiting this morning and feeling fatigued. Pt also states she feels bloated and is actively trying to get pregnant. NAD at this time.

## 2015-06-30 NOTE — Discharge Instructions (Signed)

## 2015-06-30 NOTE — ED Notes (Signed)
Pt tolerating PO intake

## 2015-07-01 LAB — HIV ANTIBODY (ROUTINE TESTING W REFLEX): HIV SCREEN 4TH GENERATION: NONREACTIVE

## 2015-07-01 LAB — RPR: RPR: NONREACTIVE

## 2015-07-02 LAB — URINE CULTURE: Culture: >=100000

## 2015-08-31 ENCOUNTER — Emergency Department (HOSPITAL_COMMUNITY)
Admission: EM | Admit: 2015-08-31 | Discharge: 2015-08-31 | Disposition: A | Discharge status: Home / Self Care | Payer: Medicaid Other | Attending: Emergency Medicine | Admitting: Emergency Medicine

## 2015-08-31 ENCOUNTER — Encounter (HOSPITAL_COMMUNITY): Payer: Self-pay | Admitting: Emergency Medicine

## 2015-08-31 DIAGNOSIS — K5909 Other constipation: Secondary | ICD-10-CM | POA: Insufficient documentation

## 2015-08-31 DIAGNOSIS — R140 Abdominal distension (gaseous): Secondary | ICD-10-CM

## 2015-08-31 DIAGNOSIS — Z8659 Personal history of other mental and behavioral disorders: Secondary | ICD-10-CM | POA: Diagnosis not present

## 2015-08-31 DIAGNOSIS — F17210 Nicotine dependence, cigarettes, uncomplicated: Secondary | ICD-10-CM | POA: Insufficient documentation

## 2015-08-31 DIAGNOSIS — Z3202 Encounter for pregnancy test, result negative: Secondary | ICD-10-CM | POA: Diagnosis not present

## 2015-08-31 DIAGNOSIS — Z8679 Personal history of other diseases of the circulatory system: Secondary | ICD-10-CM | POA: Insufficient documentation

## 2015-08-31 DIAGNOSIS — Z8619 Personal history of other infectious and parasitic diseases: Secondary | ICD-10-CM | POA: Diagnosis not present

## 2015-08-31 DIAGNOSIS — R1084 Generalized abdominal pain: Secondary | ICD-10-CM | POA: Diagnosis present

## 2015-08-31 LAB — COMPREHENSIVE METABOLIC PANEL
ALT: 18 U/L (ref 14–54)
AST: 23 U/L (ref 15–41)
Albumin: 3.8 g/dL (ref 3.5–5.0)
Alkaline Phosphatase: 61 U/L (ref 38–126)
Anion gap: 8 (ref 5–15)
BILIRUBIN TOTAL: 0.3 mg/dL (ref 0.3–1.2)
BUN: 9 mg/dL (ref 6–20)
CHLORIDE: 102 mmol/L (ref 101–111)
CO2: 29 mmol/L (ref 22–32)
CREATININE: 0.94 mg/dL (ref 0.44–1.00)
Calcium: 9.6 mg/dL (ref 8.9–10.3)
GFR calc Af Amer: >60 mL/min (ref >60)
GLUCOSE: 70 mg/dL (ref 65–99)
Potassium: 3.9 mmol/L (ref 3.5–5.1)
Sodium: 139 mmol/L (ref 135–145)
TOTAL PROTEIN: 7.6 g/dL (ref 6.5–8.1)

## 2015-08-31 LAB — POC URINE PREG, ED: Preg Test, Ur: NEGATIVE

## 2015-08-31 LAB — URINALYSIS, ROUTINE W REFLEX MICROSCOPIC
BILIRUBIN URINE: NEGATIVE
Glucose, UA: NEGATIVE mg/dL
HGB URINE DIPSTICK: NEGATIVE
KETONES UR: NEGATIVE mg/dL
Leukocytes, UA: NEGATIVE
NITRITE: NEGATIVE
PH: 7.5 (ref 5.0–8.0)
Protein, ur: NEGATIVE mg/dL
Specific Gravity, Urine: 1.024 (ref 1.005–1.030)
Urobilinogen, UA: 0.2 mg/dL (ref 0.0–1.0)

## 2015-08-31 LAB — POC OCCULT BLOOD, ED: FECAL OCCULT BLD: NEGATIVE

## 2015-08-31 LAB — CBC
HCT: 38.6 % (ref 36.0–46.0)
Hemoglobin: 12.7 g/dL (ref 12.0–15.0)
MCH: 28.5 pg (ref 26.0–34.0)
MCHC: 32.9 g/dL (ref 30.0–36.0)
MCV: 86.7 fL (ref 78.0–100.0)
PLATELETS: 217 10*3/uL (ref 150–400)
RBC: 4.45 MIL/uL (ref 3.87–5.11)
RDW: 13.3 % (ref 11.5–15.5)
WBC: 6.7 10*3/uL (ref 4.0–10.5)

## 2015-08-31 LAB — LIPASE, BLOOD: Lipase: 28 U/L (ref 11–51)

## 2015-08-31 LAB — WET PREP, GENITAL
Clue Cells Wet Prep HPF POC: NONE SEEN
Trich, Wet Prep: NONE SEEN
WBC WET PREP: NONE SEEN
YEAST WET PREP: NONE SEEN

## 2015-08-31 NOTE — ED Notes (Addendum)
Mom is in peds waiting with daughter

## 2015-08-31 NOTE — ED Provider Notes (Signed)
CSN: [id number]     Arrival date & time 08/31/15  1729 History  By signing my name below, I, Lyndel Safe, attest that this documentation has been prepared under the direction and in the presence of Kerrie Buffalo, NP.  Electronically Signed: Lyndel Safe, ED Scribe. 08/31/2015. 7:18 PM.   Chief Complaint  Patient presents with  . Abdominal Pain    The patient adviosed she has been having abdominal pain for the last two days.  She denies any urinary symptomns but says she does have foul smelling discharge.  She also say she vomited twice today.  she denies diarrhea.   Patient is a 29 y.o. female presenting with abdominal pain. The history is provided by the patient. No language interpreter was used.  Abdominal Pain Pain location:  Generalized Pain quality: bloating and sharp   Pain radiates to:  Does not radiate Pain severity:  Moderate Onset quality:  Gradual Duration:  2 days Timing:  Constant Progression:  Unchanged Chronicity:  Recurrent Context: diet changes   Relieved by:  None tried Ineffective treatments:  None tried Associated symptoms: no constipation, no diarrhea, no dysuria, no fever, no nausea, no vaginal bleeding, no vaginal discharge and no vomiting    HPI Comments: Vanessa Wise is a 29 y.o. female, with a PMhx of IBS, who presents to the Emergency Department complaining of recurrent, moderate generalized abdominal pain that she describes as a sharp pain with associated abdominal distention X 2 days. The pt has been evaluated in the recent past and diagnosed with IBS. She was prescribed colace and zantac but ceased taking these medications with improvement of her symptoms. She has additionally tried Miralax that was recommended by a pharmacist without significant relief. Pt endorses she does not follow a diet that was advised for IBS and has been eating a lot of diary and simple carbohydrates over the past 3 months. She denies nausea, vomiting, diarrhea or constipation  but reports hard stool this morning. Also denies abnormal vaginal bleeding or discharge. Patient states that she was afraid she may be pregnant.   Past Medical History  Diagnosis Date  . Chlamydia infection 02/03/2012  . Migraines   . Anxiety   . Seasonal allergies   . IBS (irritable bowel syndrome)    Past Surgical History  Procedure Laterality Date  . No past surgeries    . Mouth surgery    . Hysteroscopy N/A 01/17/2015    Procedure: HYSTEROSCOPY WITH REMOVAL INTRAUTERINE DIVICE;  Surgeon: Willodean Rosenthal, MD;  Location: [facility];  Service: Gynecology;  Laterality: N/A;   History reviewed. No pertinent family history. Social History  Substance Use Topics  . Smoking status: Current Every Day Smoker -- 1.00 packs/day for 9 years    Types: Cigarettes  . Smokeless tobacco: Never Used  . Alcohol Use: No     Comment: rare   OB History    Gravida Para Term Preterm AB TAB SAB Ectopic Multiple Living   2         2     Review of Systems  Constitutional: Negative for fever.  Gastrointestinal: Positive for abdominal pain and abdominal distention. Negative for nausea, vomiting, diarrhea and constipation.  Genitourinary: Negative for dysuria, urgency, frequency, vaginal bleeding and vaginal discharge.  All other systems reviewed and are negative.  Allergies  Review of patient's allergies indicates no known allergies.  Home Medications   Prior to Admission medications   Medication Sig Start Date End Date Taking? Authorizing Provider  acetaminophen (  TYLENOL) 325 MG tablet Take 650 mg by mouth every 6 (six) hours as needed for mild pain.    Historical Provider, MD  ibuprofen (ADVIL,MOTRIN) 200 MG tablet Take 400-600 mg by mouth every 6 (six) hours as needed for moderate pain (depends on pain level if patient takes 400 mg or 600 mg).     Historical Provider, MD  ondansetron (ZOFRAN-ODT) 8 MG disintegrating tablet Take 1 tablet (8 mg total) by mouth every 8 (eight) hours as needed for  nausea or vomiting. 06/30/15   Benjiman CoreNathan Pickering, MD  ranitidine (ZANTAC) 150 MG tablet Take 150 mg by mouth 2 (two) times daily as needed for heartburn.    Historical Provider, MD   BP 115/71 mmHg  Pulse 69  Temp(Src) 98.7 F (37.1 C) (Oral)  Resp 16  SpO2 100% Physical Exam  Constitutional: She is oriented to person, place, and time. She appears well-developed and well-nourished.  Eyes: EOM are normal.  Neck: Neck supple.  Cardiovascular: Normal rate, regular rhythm and normal heart sounds.   Pulmonary/Chest: Effort normal and breath sounds normal. No respiratory distress. She has no wheezes.  Abdominal: Soft. Bowel sounds are normal. She exhibits no distension. There is no tenderness.  No CVA tenderness.   Genitourinary: Rectal exam shows no external hemorrhoid, no fissure, no mass, no tenderness and anal tone normal. Guaiac negative stool.  External genitalia without lesions, white d/c vaginal vault, no CMT, no adnexal tenderness, uterus without palpable enlargement.  Rectal exam, small amount of hard stool palpated.   Musculoskeletal: Normal range of motion.  Neurological: She is alert and oriented to person, place, and time. No cranial nerve deficit.  Skin: Skin is warm and dry.  Psychiatric: She has a normal mood and affect. Her behavior is normal.  Nursing note and vitals reviewed.   ED Course  Procedures  Results for orders placed or performed during the hospital encounter of 08/31/15 (from the past 24 hour(s))  Lipase, blood     Status: None   Collection Time: 08/31/15  6:20 PM  Result Value Ref Range   Lipase 28 11 - 51 U/L  Comprehensive metabolic panel     Status: None   Collection Time: 08/31/15  6:20 PM  Result Value Ref Range   Sodium 139 135 - 145 mmol/L   Potassium 3.9 3.5 - 5.1 mmol/L   Chloride 102 101 - 111 mmol/L   CO2 29 22 - 32 mmol/L   Glucose, Bld 70 65 - 99 mg/dL   BUN 9 6 - 20 mg/dL   Creatinine, Ser 5.620.94 0.44 - 1.00 mg/dL   Calcium 9.6 8.9 - 13.010.3  mg/dL   Total Protein 7.6 6.5 - 8.1 g/dL   Albumin 3.8 3.5 - 5.0 g/dL   AST 23 15 - 41 U/L   ALT 18 14 - 54 U/L   Alkaline Phosphatase 61 38 - 126 U/L   Total Bilirubin 0.3 0.3 - 1.2 mg/dL   GFR calc non Af Amer >60 >60 mL/min   GFR calc Af Amer >60 >60 mL/min   Anion gap 8 5 - 15  CBC     Status: None   Collection Time: 08/31/15  6:20 PM  Result Value Ref Range   WBC 6.7 4.0 - 10.5 K/uL   RBC 4.45 3.87 - 5.11 MIL/uL   Hemoglobin 12.7 12.0 - 15.0 g/dL   HCT 86.538.6 78.436.0 - 69.646.0 %   MCV 86.7 78.0 - 100.0 fL   MCH 28.5 26.0 - 34.0  pg   MCHC 32.9 30.0 - 36.0 g/dL   RDW 16.1 09.6 - 04.5 %   Platelets 217 150 - 400 K/uL  Urinalysis, Routine w reflex microscopic (not at [facility])     Status: Abnormal   Collection Time: 08/31/15  6:23 PM  Result Value Ref Range   Color, Urine YELLOW YELLOW   APPearance HAZY (A) CLEAR   Specific Gravity, Urine 1.024 1.005 - 1.030   pH 7.5 5.0 - 8.0   Glucose, UA NEGATIVE NEGATIVE mg/dL   Hgb urine dipstick NEGATIVE NEGATIVE   Bilirubin Urine NEGATIVE NEGATIVE   Ketones, ur NEGATIVE NEGATIVE mg/dL   Protein, ur NEGATIVE NEGATIVE mg/dL   Urobilinogen, UA 0.2 0.0 - 1.0 mg/dL   Nitrite NEGATIVE NEGATIVE   Leukocytes, UA NEGATIVE NEGATIVE  POC urine preg, ED (not at [facility])     Status: None   Collection Time: 08/31/15  6:41 PM  Result Value Ref Range   Preg Test, Ur NEGATIVE NEGATIVE  Wet prep, genital     Status: None   Collection Time: 08/31/15  7:34 PM  Result Value Ref Range   Yeast Wet Prep HPF POC NONE SEEN NONE SEEN   Trich, Wet Prep NONE SEEN NONE SEEN   Clue Cells Wet Prep HPF POC NONE SEEN NONE SEEN   WBC, Wet Prep HPF POC NONE SEEN NONE SEEN  POC occult blood, ED     Status: None   Collection Time: 08/31/15  7:54 PM  Result Value Ref Range   Fecal Occult Bld NEGATIVE NEGATIVE    DIAGNOSTIC STUDIES: Oxygen Saturation is 100% on RA, normal by my interpretation.    COORDINATION OF CARE: 7:14 PM Discussed treatment plan with pt at bedside and  pt agreed to plan. Will perform pelvic and rectal exam.    MDM  29 y.o. female with hx of constipation and feeling bloated that improved with change of diet, stool softeners and Zantac but returned after she stopped the medications. Stable for d/c with normal labs and exam. Discussed in detail with the patient high fiber diet, fresh fruit and veggies and starting back on the medications she was taking previously. Also referral to GI. Patient voices understanding and agrees with plan.   Final diagnoses:  Other constipation  Bloating   I personally performed the services described in this documentation, which was scribed in my presence. The recorded information has been reviewed and is accurate.   [place], [place] 08/31/15 2106  Benjiman Core, MD 09/01/15 [phone]

## 2015-08-31 NOTE — Discharge Instructions (Signed)
Continue your colace and zantac

## 2015-08-31 NOTE — ED Notes (Signed)
The patient adviosed she has been having abdominal pain for the last two days.  She denies any urinary symptomns but says she does have foul smelling discharge.  She also say she vomited twice today.  she denies diarrhea. She denies pain at this time.

## 2015-08-31 NOTE — ED Notes (Signed)
The patient did not want to wait to be discharged.  She left without her discharge instructions and without signing.

## 2015-09-01 LAB — GC/CHLAMYDIA PROBE AMP (~~LOC~~) NOT AT ARMC
CHLAMYDIA, DNA PROBE: NEGATIVE
Neisseria Gonorrhea: NEGATIVE

## 2015-11-05 DIAGNOSIS — O1490 Unspecified pre-eclampsia, unspecified trimester: Secondary | ICD-10-CM

## 2015-11-05 HISTORY — DX: Unspecified pre-eclampsia, unspecified trimester: O14.90

## 2016-01-17 ENCOUNTER — Encounter (HOSPITAL_COMMUNITY): Payer: Self-pay | Admitting: Student

## 2016-01-17 ENCOUNTER — Inpatient Hospital Stay (HOSPITAL_COMMUNITY)
Admission: AD | Admit: 2016-01-17 | Discharge: 2016-01-17 | Disposition: A | Discharge status: Home / Self Care | Payer: Medicaid Other | Source: Ambulatory Visit | Attending: Obstetrics and Gynecology | Admitting: Obstetrics and Gynecology

## 2016-01-17 ENCOUNTER — Inpatient Hospital Stay (HOSPITAL_COMMUNITY): Payer: Medicaid Other

## 2016-01-17 DIAGNOSIS — Z3A01 Less than 8 weeks gestation of pregnancy: Secondary | ICD-10-CM | POA: Insufficient documentation

## 2016-01-17 DIAGNOSIS — O3680X0 Pregnancy with inconclusive fetal viability, not applicable or unspecified: Secondary | ICD-10-CM

## 2016-01-17 DIAGNOSIS — O219 Vomiting of pregnancy, unspecified: Secondary | ICD-10-CM

## 2016-01-17 DIAGNOSIS — R112 Nausea with vomiting, unspecified: Secondary | ICD-10-CM | POA: Diagnosis not present

## 2016-01-17 DIAGNOSIS — O9989 Other specified diseases and conditions complicating pregnancy, childbirth and the puerperium: Secondary | ICD-10-CM | POA: Diagnosis not present

## 2016-01-17 DIAGNOSIS — O26899 Other specified pregnancy related conditions, unspecified trimester: Secondary | ICD-10-CM

## 2016-01-17 DIAGNOSIS — R109 Unspecified abdominal pain: Secondary | ICD-10-CM

## 2016-01-17 DIAGNOSIS — O26891 Other specified pregnancy related conditions, first trimester: Secondary | ICD-10-CM | POA: Insufficient documentation

## 2016-01-17 DIAGNOSIS — R1030 Lower abdominal pain, unspecified: Secondary | ICD-10-CM | POA: Diagnosis present

## 2016-01-17 LAB — URINALYSIS, ROUTINE W REFLEX MICROSCOPIC
BILIRUBIN URINE: NEGATIVE
GLUCOSE, UA: NEGATIVE mg/dL
HGB URINE DIPSTICK: NEGATIVE
Ketones, ur: NEGATIVE mg/dL
Leukocytes, UA: NEGATIVE
Nitrite: NEGATIVE
PH: 6 (ref 5.0–8.0)
Protein, ur: NEGATIVE mg/dL
SPECIFIC GRAVITY, URINE: 1.025 (ref 1.005–1.030)

## 2016-01-17 LAB — WET PREP, GENITAL
Clue Cells Wet Prep HPF POC: NONE SEEN
SPERM: NONE SEEN
Trich, Wet Prep: NONE SEEN
Yeast Wet Prep HPF POC: NONE SEEN

## 2016-01-17 LAB — CBC
HEMATOCRIT: 35.7 % — AB (ref 36.0–46.0)
HEMOGLOBIN: 12.3 g/dL (ref 12.0–15.0)
MCH: 29.2 pg (ref 26.0–34.0)
MCHC: 34.5 g/dL (ref 30.0–36.0)
MCV: 84.8 fL (ref 78.0–100.0)
Platelets: 240 10*3/uL (ref 150–400)
RBC: 4.21 MIL/uL (ref 3.87–5.11)
RDW: 13.5 % (ref 11.5–15.5)
WBC: 7.1 10*3/uL (ref 4.0–10.5)

## 2016-01-17 LAB — POCT PREGNANCY, URINE: Preg Test, Ur: POSITIVE — AB

## 2016-01-17 LAB — HCG, QUANTITATIVE, PREGNANCY: hCG, Beta Chain, Quant, S: 739 m[IU]/mL — ABNORMAL HIGH (ref <5)

## 2016-01-17 MED ORDER — PROMETHAZINE HCL 25 MG PO TABS: 25.0000 mg | ORAL_TABLET | Freq: Four times a day (QID) | ORAL | Status: DC | PRN

## 2016-01-17 NOTE — MAU Note (Signed)
Yesterday felt weak, dizzy and stomach was bothering when first got up.  Passed out at work. Went home, tried to drink fluids.  Today, stomach is still bothering her and she continues to throw up. +HPT yesterday. +HPT again today.  Test results were faint.

## 2016-01-17 NOTE — Discharge Instructions (Signed)
Abdominal Pain During Pregnancy °Abdominal pain is common in pregnancy. Most of the time, it does not cause harm. There are many causes of abdominal pain. Some causes are more serious than others. Some of the causes of abdominal pain in pregnancy are easily diagnosed. Occasionally, the diagnosis takes time to understand. Other times, the cause is not determined. Abdominal pain can be a sign that something is very wrong with the pregnancy, or the pain may have nothing to do with the pregnancy at all. For this reason, always tell your health care provider if you have any abdominal discomfort. °HOME CARE INSTRUCTIONS  °Monitor your abdominal pain for any changes. The following actions may help to alleviate any discomfort you are experiencing: °· Do not have sexual intercourse or put anything in your vagina until your symptoms go away completely. °· Get plenty of rest until your pain improves. °· Drink clear fluids if you feel nauseous. Avoid solid food as long as you are uncomfortable or nauseous. °· Only take over-the-counter or prescription medicine as directed by your health care provider. °· Keep all follow-up appointments with your health care provider. °SEEK IMMEDIATE MEDICAL CARE IF: °· You are bleeding, leaking fluid, or passing tissue from the vagina. °· You have increasing pain or cramping. °· You have persistent vomiting. °· You have painful or bloody urination. °· You have a fever. °· You notice a decrease in your baby's movements. °· You have extreme weakness or feel faint. °· You have shortness of breath, with or without abdominal pain. °· You develop a severe headache with abdominal pain. °· You have abnormal vaginal discharge with abdominal pain. °· You have persistent diarrhea. °· You have abdominal pain that continues even after rest, or gets worse. °MAKE SURE YOU:  °· Understand these instructions. °· Will watch your condition. °· Will get help right away if you are not doing well or get worse. °    °This information is not intended to replace advice given to you by your health care provider. Make sure you discuss any questions you have with your health care provider. °  °Document Released: 10/21/2005 Document Revised: 08/11/2013 Document Reviewed: 05/20/2013 °Elsevier Interactive Patient Education ©2016 [employer] °Morning Sickness °Morning sickness is when you feel sick to your stomach (nauseous) during pregnancy. This nauseous feeling may or may not come with vomiting. It often occurs in the morning but can be a problem any time of day. Morning sickness is most common during the first trimester, but it may continue throughout pregnancy. While morning sickness is unpleasant, it is usually harmless unless you develop severe and continual vomiting (hyperemesis gravidarum). This condition requires more intense treatment.  °CAUSES  °The cause of morning sickness is not completely known but seems to be related to normal hormonal changes that occur in pregnancy. °RISK FACTORS °You are at greater risk if you: °· Experienced nausea or vomiting before your pregnancy. °· Had morning sickness during a previous pregnancy. °· Are pregnant with more than one baby, such as twins. °TREATMENT  °Do not use any medicines (prescription, over-the-counter, or herbal) for morning sickness without first talking to your health care provider. Your health care provider may prescribe or recommend: °· Vitamin B6 supplements. °· Anti-nausea medicines. °· The herbal medicine ginger. °HOME CARE INSTRUCTIONS  °· Only take over-the-counter or prescription medicines as directed by your health care provider. °· Taking multivitamins before getting pregnant can prevent or decrease the severity of morning sickness in most women. °· Eat a piece   of dry toast or unsalted crackers before getting out of bed in the morning. °· Eat five or six small meals a day. °· Eat dry and bland foods (rice, baked potato). Foods high in carbohydrates are often  helpful. °· Do not drink liquids with your meals. Drink liquids between meals. °· Avoid greasy, fatty, and spicy foods. °· Get someone to cook for you if the smell of any food causes nausea and vomiting. °· If you feel nauseous after taking prenatal vitamins, take the vitamins at night or with a snack.  °· Snack on protein foods (nuts, yogurt, cheese) between meals if you are hungry. °· Eat unsweetened gelatins for desserts. °· Wearing an acupressure wristband (worn for sea sickness) may be helpful. °· Acupuncture may be helpful. °· Do not smoke. °· Get a humidifier to keep the air in your house free of odors. °· Get plenty of fresh air. °SEEK MEDICAL CARE IF:  °· Your home remedies are not working, and you need medicine. °· You feel dizzy or lightheaded. °· You are losing weight. °SEEK IMMEDIATE MEDICAL CARE IF:  °· You have persistent and uncontrolled nausea and vomiting. °· You pass out (faint). °MAKE SURE YOU: °· Understand these instructions. °· Will watch your condition. °· Will get help right away if you are not doing well or get worse. °  °This information is not intended to replace advice given to you by your health care provider. Make sure you discuss any questions you have with your health care provider. °  °Document Released: 12/12/2006 Document Revised: 10/26/2013 Document Reviewed: 04/07/2013 °Elsevier Interactive Patient Education ©2016 [employer] Inc. ° °

## 2016-01-17 NOTE — MAU Provider Note (Signed)
Chief Complaint: Abdominal Pain; Emesis; and Possible Pregnancy   First Provider Initiated Contact with Patient 01/17/16 1544        SUBJECTIVE  HPI: Vanessa Wise is a 30 y.o. G3P0 at [redacted]w[redacted]d by LMP who presents to maternity admissions reporting nausea and vomiting which resulted in syncope yesterday at work.  Has kept down some things.  Also has some generalized mid and lower abdominal pain.  No bleeding. She denies vaginal bleeding, vaginal itching/burning, urinary symptoms, h/a, or fever/chills.    Emesis  This is a new problem. The current episode started yesterday. The problem occurs 2 to 4 times per day. The problem has been unchanged. There has been no fever. Associated symptoms include abdominal pain. Pertinent negatives include no arthralgias, chills, coughing, diarrhea, dizziness, fever, headaches, myalgias or URI. She has tried nothing for the symptoms.   RN Note: Yesterday felt weak, dizzy and stomach was bothering when first got up. Passed out at work. Went home, tried to drink fluids. Today, stomach is still bothering her and she continues to throw up. +HPT yesterday. +HPT again today. Test results were faint.          Past Medical History  Diagnosis Date  . Chlamydia infection 02/03/2012  . Migraines   . Anxiety   . Seasonal allergies   . IBS (irritable bowel syndrome)    Past Surgical History  Procedure Laterality Date  . No past surgeries    . Mouth surgery    . Hysteroscopy N/A 01/17/2015    Procedure: HYSTEROSCOPY WITH REMOVAL INTRAUTERINE DIVICE;  Surgeon: Willodean Rosenthal, MD;  Location: [facility];  Service: Gynecology;  Laterality: N/A;   Social History   Social History  . Marital Status: Single    Spouse Name: N/A  . Number of Children: N/A  . Years of Education: N/A   Occupational History  . Not on file.   Social History Main Topics  . Smoking status: Current Every Day Smoker -- 1.00 packs/day for 9 years    Types: Cigarettes  .  Smokeless tobacco: Never Used  . Alcohol Use: No     Comment: rare  . Drug Use: Yes    Special: Marijuana     Comment: everyday.  last used 01/16/15. None after + UPT  . Sexual Activity: Yes    Birth Control/ Protection: None   Other Topics Concern  . Not on file   Social History Narrative   No current facility-administered medications on file prior to encounter.   Current Outpatient Prescriptions on File Prior to Encounter  Medication Sig Dispense Refill  . acetaminophen (TYLENOL) 325 MG tablet Take 650 mg by mouth every 6 (six) hours as needed for mild pain.    Marland Kitchen ibuprofen (ADVIL,MOTRIN) 200 MG tablet Take 400-600 mg by mouth every 6 (six) hours as needed for moderate pain (depends on pain level if patient takes 400 mg or 600 mg).     . ondansetron (ZOFRAN-ODT) 8 MG disintegrating tablet Take 1 tablet (8 mg total) by mouth every 8 (eight) hours as needed for nausea or vomiting. 10 tablet 0  . ranitidine (ZANTAC) 150 MG tablet Take 150 mg by mouth 2 (two) times daily as needed for heartburn.     No Known Allergies  I have reviewed patient's Past Medical Hx, Surgical Hx, Family Hx, Social Hx, medications and allergies.   ROS:  Review of Systems  Constitutional: Negative for fever and chills.  Respiratory: Negative for cough.   Gastrointestinal: Positive for  vomiting and abdominal pain. Negative for diarrhea.  Musculoskeletal: Negative for myalgias and arthralgias.  Neurological: Negative for dizziness and headaches.   Other systems negative  Physical Exam  Patient Vitals for the past 24 hrs:  BP Temp Temp src Pulse Resp Height Weight  01/17/16 1405 121/76 mmHg 98.6 F (37 C) Oral 107 18  (1.626 m) 150 lb 9.6 oz (68.312 kg)    Physical Exam  Constitutional: Well-developed, well-nourished female in no acute distress.  Cardiovascular: normal rate Respiratory: normal effort GI: Abd soft, non-tender. Pos BS x 4 MS: Extremities nontender, no edema, normal  ROM Neurologic: Alert and oriented x 4.  GU: Neg CVAT.  PELVIC EXAM: Cervix pink, visually closed, without lesion, scant white creamy discharge, vaginal walls and external genitalia normal Bimanual exam: Cervix 0/long/high, firm, anterior, neg CMT, uterus nontender, nonenlarged, adnexa without tenderness, enlargement, or mass   LAB RESULTS Results for orders placed or performed during the hospital encounter of 01/17/16 (from the past 24 hour(s))  Urinalysis, Routine w reflex microscopic (not at [facility])     Status: None   Collection Time: 01/17/16  2:05 PM  Result Value Ref Range   Color, Urine YELLOW YELLOW   APPearance CLEAR CLEAR   Specific Gravity, Urine 1.025 1.005 - 1.030   pH 6.0 5.0 - 8.0   Glucose, UA NEGATIVE NEGATIVE mg/dL   Hgb urine dipstick NEGATIVE NEGATIVE   Bilirubin Urine NEGATIVE NEGATIVE   Ketones, ur NEGATIVE NEGATIVE mg/dL   Protein, ur NEGATIVE NEGATIVE mg/dL   Nitrite NEGATIVE NEGATIVE   Leukocytes, UA NEGATIVE NEGATIVE  Pregnancy, urine POC     Status: Abnormal   Collection Time: 01/17/16  2:27 PM  Result Value Ref Range   Preg Test, Ur POSITIVE (A) NEGATIVE  Wet prep, genital     Status: Abnormal   Collection Time: 01/17/16  3:50 PM  Result Value Ref Range   Yeast Wet Prep HPF POC NONE SEEN NONE SEEN   Trich, Wet Prep NONE SEEN NONE SEEN   Clue Cells Wet Prep HPF POC NONE SEEN NONE SEEN   WBC, Wet Prep HPF POC FEW (A) NONE SEEN   Sperm NONE SEEN   CBC     Status: Abnormal   Collection Time: 01/17/16  4:06 PM  Result Value Ref Range   WBC 7.1 4.0 - 10.5 K/uL   RBC 4.21 3.87 - 5.11 MIL/uL   Hemoglobin 12.3 12.0 - 15.0 g/dL   HCT 96.0 (L) 45.4 - 09.8 %   MCV 84.8 78.0 - 100.0 fL   MCH 29.2 26.0 - 34.0 pg   MCHC 34.5 30.0 - 36.0 g/dL   RDW 11.9 14.7 - 82.9 %   Platelets 240 150 - 400 K/uL  hCG, quantitative, pregnancy     Status: Abnormal   Collection Time: 01/17/16  4:06 PM  Result Value Ref Range   hCG, Beta Chain, Quant, S 739 (H) <5 mIU/mL        IMAGING US Ob Comp Less 14 Wks  01/17/2016  CLINICAL DATA:  Abdominal pain with positive pregnancy test. EXAM: OBSTETRIC <14 WK [place] AND TRANSVAGINAL OB US TECHNIQUE: Both transabdominal and transvaginal ultrasound examinations were performed for complete evaluation of the gestation as well as the maternal uterus, adnexal regions, and pelvic cul-de-sac. Transvaginal technique was performed to assess early pregnancy. COMPARISON:  No comparison studies available. FINDINGS: Intrauterine gestational sac: Not visualized. Yolk sac:  Not visualized. Embryo:  Not visualized. Cardiac Activity: Not visualized. Subchorionic hemorrhage:  None visualized. Maternal uterus/adnexae: Endometrium is thin and homogeneous. Maternal ovaries are normal in appearance with probable corpus luteum cyst in the parenchyma of the right ovary. IMPRESSION: No intrauterine gestation. Given the history of a positive pregnancy test, differential considerations include intrauterine gestation too early to visualize, completed abortion, or nonvisualized ectopic pregnancy. Close clinical correlation is recommended with serial beta-hCG and followup ultrasound as warranted. Electronically Signed   By: Kennith CenterEric  Mansell M.D.   On: 01/17/2016 17:02   [place] Ob Transvaginal  01/17/2016  CLINICAL DATA:  Abdominal pain with positive pregnancy test. EXAM: OBSTETRIC <14 WK US AND TRANSVAGINAL OB US TECHNIQUE: Both transabdominal and transvaginal ultrasound examinations were performed for complete evaluation of the gestation as well as the maternal uterus, adnexal regions, and pelvic cul-de-sac. Transvaginal technique was performed to assess early pregnancy. COMPARISON:  No comparison studies available. FINDINGS: Intrauterine gestational sac: Not visualized. Yolk sac:  Not visualized. Embryo:  Not visualized. Cardiac Activity: Not visualized. Subchorionic hemorrhage:  None visualized. Maternal uterus/adnexae: Endometrium is thin and homogeneous. Maternal  ovaries are normal in appearance with probable corpus luteum cyst in the parenchyma of the right ovary. IMPRESSION: No intrauterine gestation. Given the history of a positive pregnancy test, differential considerations include intrauterine gestation too early to visualize, completed abortion, or nonvisualized ectopic pregnancy. Close clinical correlation is recommended with serial beta-hCG and followup ultrasound as warranted. Electronically Signed   By: Kennith CenterEric  Mansell M.D.   On: 01/17/2016 17:02    MAU Management/MDM: Ordered labs and reviewed results.     This pain could represent a normal pregnancy with bleeding, spontaneous abortion or even an ectopic which can be life-threatening.   Cultures were done to rule out pelvic infection Blood drawn for Quant HCG, CBC, ABO/Rh  Pt stable at time of discharge.  ASSESSMENT Pregnancy at 3480w0d by LMP Pregnancy of unknown location  PLAN Discharge home To return Friday afternoon for second Quant HCG Ectopic precautions    Medication List    ASK your doctor about these medications        acetaminophen 325 MG tablet  Commonly known as:  TYLENOL  Take 650 mg by mouth every 6 (six) hours as needed for mild pain.     ibuprofen 200 MG tablet  Commonly known as:  ADVIL,MOTRIN  Take 400-600 mg by mouth every 6 (six) hours as needed for moderate pain (depends on pain level if patient takes 400 mg or 600 mg).     ondansetron 8 MG disintegrating tablet  Commonly known as:  ZOFRAN-ODT  Take 1 tablet (8 mg total) by mouth every 8 (eight) hours as needed for nausea or vomiting.     ranitidine 150 MG tablet  Commonly known as:  ZANTAC  Take 150 mg by mouth 2 (two) times daily as needed for heartburn.         Encouraged to return here or to other Urgent Care/ED if she develops worsening of symptoms, increase in pain, fever, or other concerning symptoms.    Wynelle BourgeoisMarie Ismahan Lippman CNM, MSN Certified Nurse-Midwife 01/17/2016  3:44 PM

## 2016-01-18 LAB — HIV ANTIBODY (ROUTINE TESTING W REFLEX): HIV Screen 4th Generation wRfx: NONREACTIVE

## 2016-01-18 LAB — GC/CHLAMYDIA PROBE AMP (~~LOC~~) NOT AT ARMC
CHLAMYDIA, DNA PROBE: NEGATIVE
Neisseria Gonorrhea: NEGATIVE

## 2016-01-19 ENCOUNTER — Inpatient Hospital Stay (HOSPITAL_COMMUNITY)
Admission: AD | Admit: 2016-01-19 | Discharge: 2016-01-20 | Discharge status: Against Medical Advice | Payer: Medicaid Other | Source: Ambulatory Visit | Attending: Obstetrics | Admitting: Obstetrics

## 2016-01-20 NOTE — MAU Note (Signed)
Pt returned to admissions desk and spoke with foul language to admission person and left the hospital.

## 2016-01-20 NOTE — MAU Note (Signed)
Pt called and not in lobby. Admission person stated pt told her someone coming to pick up her child and pt went outside

## 2016-03-01 ENCOUNTER — Encounter (HOSPITAL_COMMUNITY): Payer: Self-pay | Admitting: *Deleted

## 2016-03-01 ENCOUNTER — Inpatient Hospital Stay (HOSPITAL_COMMUNITY)
Admission: AD | Admit: 2016-03-01 | Discharge: 2016-03-01 | Disposition: A | Discharge status: Home / Self Care | Payer: Medicaid Other | Source: Ambulatory Visit | Attending: Obstetrics | Admitting: Obstetrics

## 2016-03-01 DIAGNOSIS — F17210 Nicotine dependence, cigarettes, uncomplicated: Secondary | ICD-10-CM | POA: Insufficient documentation

## 2016-03-01 DIAGNOSIS — R0600 Dyspnea, unspecified: Secondary | ICD-10-CM

## 2016-03-01 DIAGNOSIS — O210 Mild hyperemesis gravidarum: Secondary | ICD-10-CM | POA: Diagnosis not present

## 2016-03-01 DIAGNOSIS — O26891 Other specified pregnancy related conditions, first trimester: Secondary | ICD-10-CM | POA: Diagnosis not present

## 2016-03-01 DIAGNOSIS — O99511 Diseases of the respiratory system complicating pregnancy, first trimester: Secondary | ICD-10-CM | POA: Insufficient documentation

## 2016-03-01 DIAGNOSIS — O26811 Pregnancy related exhaustion and fatigue, first trimester: Secondary | ICD-10-CM | POA: Diagnosis present

## 2016-03-01 DIAGNOSIS — R519 Headache, unspecified: Secondary | ICD-10-CM

## 2016-03-01 DIAGNOSIS — O99331 Smoking (tobacco) complicating pregnancy, first trimester: Secondary | ICD-10-CM | POA: Insufficient documentation

## 2016-03-01 DIAGNOSIS — O219 Vomiting of pregnancy, unspecified: Secondary | ICD-10-CM | POA: Diagnosis not present

## 2016-03-01 DIAGNOSIS — Z3A10 10 weeks gestation of pregnancy: Secondary | ICD-10-CM | POA: Diagnosis not present

## 2016-03-01 DIAGNOSIS — K589 Irritable bowel syndrome without diarrhea: Secondary | ICD-10-CM | POA: Diagnosis not present

## 2016-03-01 DIAGNOSIS — R51 Headache: Secondary | ICD-10-CM | POA: Diagnosis not present

## 2016-03-01 DIAGNOSIS — R5383 Other fatigue: Secondary | ICD-10-CM

## 2016-03-01 DIAGNOSIS — R0602 Shortness of breath: Secondary | ICD-10-CM | POA: Diagnosis not present

## 2016-03-01 LAB — COMPREHENSIVE METABOLIC PANEL WITH GFR
ALT: 22 U/L (ref 14–54)
AST: 22 U/L (ref 15–41)
Albumin: 3.6 g/dL (ref 3.5–5.0)
Alkaline Phosphatase: 43 U/L (ref 38–126)
Anion gap: 4 — ABNORMAL LOW (ref 5–15)
BUN: 8 mg/dL (ref 6–20)
CO2: 25 mmol/L (ref 22–32)
Calcium: 8.9 mg/dL (ref 8.9–10.3)
Chloride: 105 mmol/L (ref 101–111)
Creatinine, Ser: 0.61 mg/dL (ref 0.44–1.00)
GFR calc Af Amer: >60 mL/min
GFR calc non Af Amer: >60 mL/min
Glucose, Bld: 83 mg/dL (ref 65–99)
Potassium: 3.9 mmol/L (ref 3.5–5.1)
Sodium: 134 mmol/L — ABNORMAL LOW (ref 135–145)
Total Bilirubin: 0.7 mg/dL (ref 0.3–1.2)
Total Protein: 7.6 g/dL (ref 6.5–8.1)

## 2016-03-01 LAB — URINALYSIS, ROUTINE W REFLEX MICROSCOPIC
BILIRUBIN URINE: NEGATIVE
Glucose, UA: NEGATIVE mg/dL
Hgb urine dipstick: NEGATIVE
KETONES UR: NEGATIVE mg/dL
Leukocytes, UA: NEGATIVE
NITRITE: NEGATIVE
PROTEIN: NEGATIVE mg/dL
Specific Gravity, Urine: 1.015 (ref 1.005–1.030)
pH: 8.5 — ABNORMAL HIGH (ref 5.0–8.0)

## 2016-03-01 LAB — CBC
HCT: 34.7 % — ABNORMAL LOW (ref 36.0–46.0)
Hemoglobin: 12 g/dL (ref 12.0–15.0)
MCH: 28.7 pg (ref 26.0–34.0)
MCHC: 34.6 g/dL (ref 30.0–36.0)
MCV: 83 fL (ref 78.0–100.0)
PLATELETS: 208 10*3/uL (ref 150–400)
RBC: 4.18 MIL/uL (ref 3.87–5.11)
RDW: 13.4 % (ref 11.5–15.5)
WBC: 7.9 10*3/uL (ref 4.0–10.5)

## 2016-03-01 MED ORDER — ACETAMINOPHEN 500 MG PO TABS
1000.0000 mg | ORAL_TABLET | Freq: Once | ORAL | Status: AC
  Administered 2016-03-01: 1000 mg via ORAL
  Filled 2016-03-01: qty 2

## 2016-03-01 MED ORDER — METOCLOPRAMIDE HCL 10 MG PO TABS
10.0000 mg | ORAL_TABLET | Freq: Once | ORAL | Status: AC
  Administered 2016-03-01: 10 mg via ORAL
  Filled 2016-03-01: qty 1

## 2016-03-01 MED ORDER — METOCLOPRAMIDE HCL 10 MG PO TABS: 10.0000 mg | ORAL_TABLET | Freq: Four times a day (QID) | ORAL | Status: DC

## 2016-03-01 NOTE — MAU Note (Signed)
Patient presents at [redacted] weeks gestation with c/o feeling weak X 3 days. Denies bleeding and states discharge is normal.

## 2016-03-01 NOTE — Discharge Instructions (Signed)
Morning Sickness °Morning sickness is when you feel sick to your stomach (nauseous) during pregnancy. This nauseous feeling may or may not come with vomiting. It often occurs in the morning but can be a problem any time of day. Morning sickness is most common during the first trimester, but it may continue throughout pregnancy. While morning sickness is unpleasant, it is usually harmless unless you develop severe and continual vomiting (hyperemesis gravidarum). This condition requires more intense treatment.  °CAUSES  °The cause of morning sickness is not completely known but seems to be related to normal hormonal changes that occur in pregnancy. °RISK FACTORS °You are at greater risk if you: °· Experienced nausea or vomiting before your pregnancy. °· Had morning sickness during a previous pregnancy. °· Are pregnant with more than one baby, such as twins. °TREATMENT  °Do not use any medicines (prescription, over-the-counter, or herbal) for morning sickness without first talking to your health care provider. Your health care provider may prescribe or recommend: °· Vitamin B6 supplements. °· Anti-nausea medicines. °· The herbal medicine ginger. °HOME CARE INSTRUCTIONS  °· Only take over-the-counter or prescription medicines as directed by your health care provider. °· Taking multivitamins before getting pregnant can prevent or decrease the severity of morning sickness in most women. °· Eat a piece of dry toast or unsalted crackers before getting out of bed in the morning. °· Eat five or six small meals a day. °· Eat dry and bland foods (rice, baked potato). Foods high in carbohydrates are often helpful. °· Do not drink liquids with your meals. Drink liquids between meals. °· Avoid greasy, fatty, and spicy foods. °· Get someone to cook for you if the smell of any food causes nausea and vomiting. °· If you feel nauseous after taking prenatal vitamins, take the vitamins at night or with a snack.  °· Snack on protein  foods (nuts, yogurt, cheese) between meals if you are hungry. °· Eat unsweetened gelatins for desserts. °· Wearing an acupressure wristband (worn for sea sickness) may be helpful. °· Acupuncture may be helpful. °· Do not smoke. °· Get a humidifier to keep the air in your house free of odors. °· Get plenty of fresh air. °SEEK MEDICAL CARE IF:  °· Your home remedies are not working, and you need medicine. °· You feel dizzy or lightheaded. °· You are losing weight. °SEEK IMMEDIATE MEDICAL CARE IF:  °· You have persistent and uncontrolled nausea and vomiting. °· You pass out (faint). °MAKE SURE YOU: °· Understand these instructions. °· Will watch your condition. °· Will get help right away if you are not doing well or get worse. °  °This information is not intended to replace advice given to you by your health care provider. Make sure you discuss any questions you have with your health care provider. °  °Document Released: 12/12/2006 Document Revised: 10/26/2013 Document Reviewed: 04/07/2013 °Elsevier Interactive Patient Education ©2016 [employer] °First Trimester of Pregnancy °The first trimester of pregnancy is from week 1 until the end of week 12 (months 1 through 3). A week after a sperm fertilizes an egg, the egg will implant on the wall of the uterus. This embryo will begin to develop into a baby. Genes from you and your partner are forming the baby. The female genes determine whether the baby is a boy or a girl. At 6-8 weeks, the eyes and face are formed, and the heartbeat can be seen on ultrasound. At the end of 12 weeks, all the baby's organs   are formed.  °Now that you are pregnant, you will want to do everything you can to have a healthy baby. Two of the most important things are to get good prenatal care and to follow your health care provider's instructions. Prenatal care is all the medical care you receive before the baby's birth. This care will help prevent, find, and treat any problems during the  pregnancy and childbirth. °BODY CHANGES °Your body goes through many changes during pregnancy. The changes vary from woman to woman.  °· You may gain or lose a couple of pounds at first. °· You may feel sick to your stomach (nauseous) and throw up (vomit). If the vomiting is uncontrollable, call your health care provider. °· You may tire easily. °· You may develop headaches that can be relieved by medicines approved by your health care provider. °· You may urinate more often. Painful urination may mean you have a bladder infection. °· You may develop heartburn as a result of your pregnancy. °· You may develop constipation because certain hormones are causing the muscles that push waste through your intestines to slow down. °· You may develop hemorrhoids or swollen, bulging veins (varicose veins). °· Your breasts may begin to grow larger and become tender. Your nipples may stick out more, and the tissue that surrounds them (areola) may become darker. °· Your gums may bleed and may be sensitive to brushing and flossing. °· Dark spots or blotches (chloasma, mask of pregnancy) may develop on your face. This will likely fade after the baby is born. °· Your menstrual periods will stop. °· You may have a loss of appetite. °· You may develop cravings for certain kinds of food. °· You may have changes in your emotions from day to day, such as being excited to be pregnant or being concerned that something may go wrong with the pregnancy and baby. °· You may have more vivid and strange dreams. °· You may have changes in your hair. These can include thickening of your hair, rapid growth, and changes in texture. Some women also have hair loss during or after pregnancy, or hair that feels dry or thin. Your hair will most likely return to normal after your baby is born. °WHAT TO EXPECT AT YOUR PRENATAL VISITS °During a routine prenatal visit: °· You will be weighed to make sure you and the baby are growing normally. °· Your blood  pressure will be taken. °· Your abdomen will be measured to track your baby's growth. °· The fetal heartbeat will be listened to starting around week 10 or 12 of your pregnancy. °· Test results from any previous visits will be discussed. °Your health care provider may ask you: °· How you are feeling. °· If you are feeling the baby move. °· If you have had any abnormal symptoms, such as leaking fluid, bleeding, severe headaches, or abdominal cramping. °· If you are using any tobacco products, including cigarettes, chewing tobacco, and electronic cigarettes. °· If you have any questions. °Other tests that may be performed during your first trimester include: °· Blood tests to find your blood type and to check for the presence of any previous infections. They will also be used to check for low iron levels (anemia) and Rh antibodies. Later in the pregnancy, blood tests for diabetes will be done along with other tests if problems develop. °· Urine tests to check for infections, diabetes, or protein in the urine. °· An ultrasound to confirm the proper growth and development of   the baby. °· An amniocentesis to check for possible genetic problems. °· Fetal screens for spina bifida and Down syndrome. °· You may need other tests to make sure you and the baby are doing well. °· HIV (human immunodeficiency virus) testing. Routine prenatal testing includes screening for HIV, unless you choose not to have this test. °HOME CARE INSTRUCTIONS  °Medicines °· Follow your health care provider's instructions regarding medicine use. Specific medicines may be either safe or unsafe to take during pregnancy. °· Take your prenatal vitamins as directed. °· If you develop constipation, try taking a stool softener if your health care provider approves. °Diet °· Eat regular, well-balanced meals. Choose a variety of foods, such as meat or vegetable-based protein, fish, milk and low-fat dairy products, vegetables, fruits, and whole grain breads  and cereals. Your health care provider will help you determine the amount of weight gain that is right for you. °· Avoid raw meat and uncooked cheese. These carry germs that can cause birth defects in the baby. °· Eating four or five small meals rather than three large meals a day may help relieve nausea and vomiting. If you start to feel nauseous, eating a few soda crackers can be helpful. Drinking liquids between meals instead of during meals also seems to help nausea and vomiting. °· If you develop constipation, eat more high-fiber foods, such as fresh vegetables or fruit and whole grains. Drink enough fluids to keep your urine clear or pale yellow. °Activity and Exercise °· Exercise only as directed by your health care provider. Exercising will help you: °¨ Control your weight. °¨ Stay in shape. °¨ Be prepared for labor and delivery. °· Experiencing pain or cramping in the lower abdomen or low back is a good sign that you should stop exercising. Check with your health care provider before continuing normal exercises. °· Try to avoid standing for long periods of time. Move your legs often if you must stand in one place for a long time. °· Avoid heavy lifting. °· Wear low-heeled shoes, and practice good posture. °· You may continue to have sex unless your health care provider directs you otherwise. °Relief of Pain or Discomfort °· Wear a good support bra for breast tenderness.   °· Take warm sitz baths to soothe any pain or discomfort caused by hemorrhoids. Use hemorrhoid cream if your health care provider approves.   °· Rest with your legs elevated if you have leg cramps or low back pain. °· If you develop varicose veins in your legs, wear support hose. Elevate your feet for 15 minutes, 3-4 times a day. Limit salt in your diet. °Prenatal Care °· Schedule your prenatal visits by the twelfth week of pregnancy. They are usually scheduled monthly at first, then more often in the last 2 months before  delivery. °· Write down your questions. Take them to your prenatal visits. °· Keep all your prenatal visits as directed by your health care provider. °Safety °· Wear your seat belt at all times when driving. °· Make a list of emergency phone numbers, including numbers for family, friends, the hospital, and police and fire departments. °General Tips °· Ask your health care provider for a referral to a local prenatal education class. Begin classes no later than at the beginning of month 6 of your pregnancy. °· Ask for help if you have counseling or nutritional needs during pregnancy. Your health care provider can offer advice or refer you to specialists for help with various needs. °· Do not use   hot tubs, steam rooms, or saunas. °· Do not douche or use tampons or scented sanitary pads. °· Do not cross your legs for long periods of time. °· Avoid cat litter boxes and soil used by cats. These carry germs that can cause birth defects in the baby and possibly loss of the fetus by miscarriage or stillbirth. °· Avoid all smoking, herbs, alcohol, and medicines not prescribed by your health care provider. Chemicals in these affect the formation and growth of the baby. °· Do not use any tobacco products, including cigarettes, chewing tobacco, and electronic cigarettes. If you need help quitting, ask your health care provider. You may receive counseling support and other resources to help you quit. °· Schedule a dentist appointment. At home, brush your teeth with a soft toothbrush and be gentle when you floss. °SEEK MEDICAL CARE IF:  °· You have dizziness. °· You have mild pelvic cramps, pelvic pressure, or nagging pain in the abdominal area. °· You have persistent nausea, vomiting, or diarrhea. °· You have a bad smelling vaginal discharge. °· You have pain with urination. °· You notice increased swelling in your face, hands, legs, or ankles. °SEEK IMMEDIATE MEDICAL CARE IF:  °· You have a fever. °· You are leaking fluid from  your vagina. °· You have spotting or bleeding from your vagina. °· You have severe abdominal cramping or pain. °· You have rapid weight gain or loss. °· You vomit blood or material that looks like coffee grounds. °· You are exposed to German measles and have never had them. °· You are exposed to fifth disease or chickenpox. °· You develop a severe headache. °· You have shortness of breath. °· You have any kind of trauma, such as from a fall or a car accident. °  °This information is not intended to replace advice given to you by your health care provider. Make sure you discuss any questions you have with your health care provider. °  °Document Released: 10/15/2001 Document Revised: 11/11/2014 Document Reviewed: 08/31/2013 °Elsevier Interactive Patient Education ©2016 [employer] Inc. ° °

## 2016-03-01 NOTE — MAU Provider Note (Signed)
Unable to obtain FHTs with doppler. Patient has had previous US showing IUP. Limitations of bedside exam were explained to the patient. Limited OB US performed at bedside with Karyl KinnierKim Newton, MD to confirm FHR.  FHR appears normal, fetal movement noted on US.   Patient is reassured.   Marny LowensteinJulie N Krosby Ritchie, PA-C 03/01/2016 4:48 PM

## 2016-03-01 NOTE — MAU Provider Note (Signed)
Chief Complaint: Fatigue and Shortness of Breath   First Provider Initiated Contact with Patient 03/01/16 1606      SUBJECTIVE HPI: Vanessa Wise is a 30 y.o. G3P1102 at 7972w2d by LMP who presents to maternity admissions reporting fatigue, dizziness, and shortness of breath intermittently x 3 days.  She reports nausea and lack of appetite but no recent vomiting.  She has a severe constant frontal headache with light sensitivity.  She has not eaten x 5-6 hours and reports this is usual with her lack of appetite. She has not tried anything for her symptoms, including medications. She reports some abdominal soreness also that is occasional, reporting no pain at present.   She denies vaginal bleeding, vaginal itching/burning, urinary symptoms, or fever/chills.     HPI  Past Medical History  Diagnosis Date  . Chlamydia infection 02/03/2012  . Migraines   . Anxiety   . Seasonal allergies   . IBS (irritable bowel syndrome)    Past Surgical History  Procedure Laterality Date  . No past surgeries    . Mouth surgery    . Hysteroscopy N/A 01/17/2015    Procedure: HYSTEROSCOPY WITH REMOVAL INTRAUTERINE DIVICE;  Surgeon: Willodean Rosenthalarolyn Harraway-Smith, MD;  Location: [facility];  Service: Gynecology;  Laterality: N/A;  . Iud removal     Social History   Social History  . Marital Status: Single    Spouse Name: N/A  . Number of Children: N/A  . Years of Education: N/A   Occupational History  . Not on file.   Social History Main Topics  . Smoking status: Current Every Day Smoker -- 1.00 packs/day for 9 years    Types: Cigarettes  . Smokeless tobacco: Never Used  . Alcohol Use: No     Comment: rare  . Drug Use: Yes    Special: Marijuana     Comment: everyday.  last used 01/16/15. None after + UPT  . Sexual Activity: Yes    Birth Control/ Protection: None   Other Topics Concern  . Not on file   Social History Narrative   No current facility-administered medications on file prior to  encounter.   Current Outpatient Prescriptions on File Prior to Encounter  Medication Sig Dispense Refill  . promethazine (PHENERGAN) 25 MG tablet Take 1 tablet (25 mg total) by mouth every 6 (six) hours as needed. (Patient not taking: Reported on 03/01/2016) 30 tablet 0   No Known Allergies  ROS:  Review of Systems  Constitutional: Positive for appetite change and fatigue. Negative for fever and chills.  HENT: Negative for tinnitus.   Eyes: Positive for photophobia.  Respiratory: Negative for shortness of breath.   Cardiovascular: Negative for chest pain.  Gastrointestinal: Positive for nausea. Negative for vomiting and constipation.  Genitourinary: Positive for pelvic pain. Negative for dysuria, flank pain, vaginal bleeding, vaginal discharge, difficulty urinating and vaginal pain.  Neurological: Positive for dizziness and headaches.  Psychiatric/Behavioral: Negative.      I have reviewed patient's Past Medical Hx, Surgical Hx, Family Hx, Social Hx, medications and allergies.   Physical Exam   Patient Vitals for the past 24 hrs:  BP Temp Temp src Pulse Resp SpO2 Height Weight  03/01/16 1752 114/59 mmHg - - 83 - - - -  03/01/16 1424 125/71 mmHg 98.4 F (36.9 C) Oral 79 16 98 % 5\' 5"  (1.651 m) 149 lb 8 oz (67.813 kg)   Constitutional: Well-developed, well-nourished female in no acute distress.  Cardiovascular: normal rate Respiratory: normal effort GI:  Abd soft, non-tender. Pos BS x 4 MS: Extremities nontender, no edema, normal ROM Neurologic: Alert and oriented x 4.  GU: Neg CVAT.  FHT no audible by doppler  Bedside US by Dr Alvester Morin indicates normal FHR and amniotic fluid and size c/w LMP dates  LAB RESULTS Results for orders placed or performed during the hospital encounter of 03/01/16 (from the past 24 hour(s))  Urinalysis, Routine w reflex microscopic (not at [facility])     Status: Abnormal   Collection Time: 03/01/16  2:30 PM  Result Value Ref Range   Color, Urine YELLOW  YELLOW   APPearance HAZY (A) CLEAR   Specific Gravity, Urine 1.015 1.005 - 1.030   pH 8.5 (H) 5.0 - 8.0   Glucose, UA NEGATIVE NEGATIVE mg/dL   Hgb urine dipstick NEGATIVE NEGATIVE   Bilirubin Urine NEGATIVE NEGATIVE   Ketones, ur NEGATIVE NEGATIVE mg/dL   Protein, ur NEGATIVE NEGATIVE mg/dL   Nitrite NEGATIVE NEGATIVE   Leukocytes, UA NEGATIVE NEGATIVE  CBC     Status: Abnormal   Collection Time: 03/01/16  3:09 PM  Result Value Ref Range   WBC 7.9 4.0 - 10.5 K/uL   RBC 4.18 3.87 - 5.11 MIL/uL   Hemoglobin 12.0 12.0 - 15.0 g/dL   HCT 33.2 (L) 95.1 - 88.4 %   MCV 83.0 78.0 - 100.0 fL   MCH 28.7 26.0 - 34.0 pg   MCHC 34.6 30.0 - 36.0 g/dL   RDW 16.6 06.3 - 01.6 %   Platelets 208 150 - 400 K/uL  Comprehensive metabolic panel     Status: Abnormal   Collection Time: 03/01/16  3:09 PM  Result Value Ref Range   Sodium 134 (L) 135 - 145 mmol/L   Potassium 3.9 3.5 - 5.1 mmol/L   Chloride 105 101 - 111 mmol/L   CO2 25 22 - 32 mmol/L   Glucose, Bld 83 65 - 99 mg/dL   BUN 8 6 - 20 mg/dL   Creatinine, Ser 0.10 0.44 - 1.00 mg/dL   Calcium 8.9 8.9 - 93.2 mg/dL   Total Protein 7.6 6.5 - 8.1 g/dL   Albumin 3.6 3.5 - 5.0 g/dL   AST 22 15 - 41 U/L   ALT 22 14 - 54 U/L   Alkaline Phosphatase 43 38 - 126 U/L   Total Bilirubin 0.7 0.3 - 1.2 mg/dL   GFR calc non Af Amer >60 >60 mL/min   GFR calc Af Amer >60 >60 mL/min   Anion gap 4 (L) 5 - 15       IMAGING No results found.  MAU Management/MDM: Ordered labs and reviewed results.  Reviewed normal lab results with pt.  Discussed need to eat/drink regularly in pregnancy. Pt states understanding.  Tolerated PO food/fluids in MAU.  Reglan 10 mg PO and Tylenol 1000 mg PO x 1 dose given with improved h/a and nausea.  Pt stable at time of discharge.  ASSESSMENT 1. Nausea and vomiting during pregnancy prior to [redacted] weeks gestation   2. Other fatigue   3. Shortness of breath due to pregnancy, first trimester   4. Headache in pregnancy,  antepartum, first trimester     PLAN Discharge home Rx for Reglan 10 mg PO Q 6 hours Increase PO food/fluids, eat and drink regularly    Medication List    TAKE these medications        acetaminophen 500 MG tablet  Commonly known as:  TYLENOL  Take 1,000 mg by mouth every 6 (six)  hours as needed.     metoCLOPramide 10 MG tablet  Commonly known as:  REGLAN  Take 1 tablet (10 mg total) by mouth every 6 (six) hours.     prenatal multivitamin Tabs tablet  Take 1 tablet by mouth daily at 12 noon.     promethazine 25 MG tablet  Commonly known as:  PHENERGAN  Take 1 tablet (25 mg total) by mouth every 6 (six) hours as needed.       Follow-up Information    Follow up with Kathreen Cosier, MD.   Specialty:  Obstetrics and Gynecology   Why:  As scheduled, Return to MAU as needed for emergencies   Contact information:   [address] [place] [address] [phone]       Sharen Counter Certified Nurse-Midwife 03/01/2016  6:49 PM

## 2016-05-01 LAB — OB RESULTS CONSOLE HGB/HCT, BLOOD
HEMATOCRIT: 33 %
HEMOGLOBIN: 11 g/dL

## 2016-05-01 LAB — OB RESULTS CONSOLE RPR: RPR: NONREACTIVE

## 2016-05-01 LAB — OB RESULTS CONSOLE HIV ANTIBODY (ROUTINE TESTING): HIV: NONREACTIVE

## 2016-05-01 LAB — OB RESULTS CONSOLE GC/CHLAMYDIA
Chlamydia: NEGATIVE
Gonorrhea: NEGATIVE

## 2016-05-01 LAB — OB RESULTS CONSOLE PLATELET COUNT: PLATELETS: 217 10*3/uL

## 2016-05-01 LAB — OB RESULTS CONSOLE RUBELLA ANTIBODY, IGM: RUBELLA: IMMUNE

## 2016-05-02 LAB — OB RESULTS CONSOLE HEPATITIS B SURFACE ANTIGEN: Hepatitis B Surface Ag: NEGATIVE

## 2016-05-09 ENCOUNTER — Telehealth: Payer: Self-pay | Admitting: Radiology

## 2016-05-09 NOTE — Telephone Encounter (Signed)
More info on phone call which is documented above:  I explained to her that we needed an order to schedule the u/s, I gave her the example of needing a drs rx in order to receive meds at the pharmacy.  She began telling me about [facility] being [facility] and that a desk person chased her out the door.  She originally s/w [employer] and had trouble telling her dob. Pt hung up on me after explaining again, that we needed an u/s order prior to scheduling.  She stated she needed to know the sex of her baby.

## 2016-05-29 ENCOUNTER — Ambulatory Visit (INDEPENDENT_AMBULATORY_CARE_PROVIDER_SITE_OTHER): Payer: Medicaid Other | Admitting: Certified Nurse Midwife

## 2016-05-29 VITALS — BP 122/76 | HR 66 | Temp 97.4°F | Wt 164.0 lb

## 2016-05-29 DIAGNOSIS — Z3482 Encounter for supervision of other normal pregnancy, second trimester: Secondary | ICD-10-CM

## 2016-05-29 DIAGNOSIS — Z331 Pregnant state, incidental: Secondary | ICD-10-CM

## 2016-05-29 DIAGNOSIS — Z1389 Encounter for screening for other disorder: Secondary | ICD-10-CM | POA: Diagnosis not present

## 2016-05-29 DIAGNOSIS — Z3492 Encounter for supervision of normal pregnancy, unspecified, second trimester: Secondary | ICD-10-CM | POA: Diagnosis not present

## 2016-05-29 DIAGNOSIS — Z716 Tobacco abuse counseling: Secondary | ICD-10-CM

## 2016-05-29 LAB — POCT URINALYSIS DIPSTICK
BILIRUBIN UA: NEGATIVE
GLUCOSE UA: NEGATIVE
KETONES UA: NEGATIVE
Leukocytes, UA: NEGATIVE
Nitrite, UA: NEGATIVE
Protein, UA: POSITIVE
SPEC GRAV UA: 1.01
UROBILINOGEN UA: NEGATIVE
pH, UA: 5

## 2016-05-29 MED ORDER — PRENATE PIXIE 10-0.6-0.4-200 MG PO CAPS: 1.0000 | ORAL_CAPSULE | Freq: Every day | ORAL | 12 refills | Status: DC

## 2016-05-29 MED ORDER — BUPROPION HCL ER (SR) 150 MG PO TB12: 150.0000 mg | ORAL_TABLET | Freq: Two times a day (BID) | ORAL | 2 refills | Status: DC

## 2016-05-29 NOTE — Addendum Note (Signed)
Addended by: Samantha Crimes on: 05/29/2016 03:22 PM   Modules accepted: Orders

## 2016-05-29 NOTE — Progress Notes (Addendum)
Subjective:    Vanessa Wise is being seen today for her first obstetrical visit.  This is a planned pregnancy. She is at [redacted]w[redacted]d gestation. Her obstetrical history is significant for smoker and 8 cigs/day. Relationship with FOB: spouse, living together. Patient does intend to breast feed. Pregnancy history fully reviewed.  The information documented in the HPI was reviewed and verified.  Menstrual History: OB History    Gravida Para Term Preterm AB Living   3 2 1 1   2    SAB TAB Ectopic Multiple Live Births                 Vaginal deliveries: delivered at 37 weeks.   Menarche age: 30 years of age  Patient's last menstrual period was 12/20/2015.    Past Medical History:  Diagnosis Date  . Anxiety   . Chlamydia infection 02/03/2012  . IBS (irritable bowel syndrome)   . Migraines   . Seasonal allergies     Past Surgical History:  Procedure Laterality Date  . HYSTEROSCOPY N/A 01/17/2015   Procedure: HYSTEROSCOPY WITH REMOVAL INTRAUTERINE DIVICE;  Surgeon: Willodean Rosenthal, MD;  Location: [facility];  Service: Gynecology;  Laterality: N/A;  . IUD REMOVAL    . MOUTH SURGERY    . NO PAST SURGERIES       (Not in a hospital admission) No Known Allergies  Social History  Substance Use Topics  . Smoking status: Current Every Day Smoker    Packs/day: 1.00    Years: 9.00    Types: Cigarettes  . Smokeless tobacco: Never Used  . Alcohol use No     Comment: rare    No family history on file.   Review of Systems Constitutional: negative for weight loss Gastrointestinal: negative for vomiting Genitourinary:negative for genital lesions and vaginal discharge and dysuria Musculoskeletal:negative for back pain Behavioral/Psych: negative for abusive relationship, depression, illegal drug usage and tobacco use    Objective:    BP 122/76   Pulse 66   Temp 97.4 F (36.3 C)   Wt 164 lb (74.4 kg)   LMP 12/20/2015   BMI 27.29 kg/m  General Appearance:    Alert, cooperative, no  distress, appears stated age  Head:    Normocephalic, without obvious abnormality, atraumatic  Eyes:    PERRL, conjunctiva/corneas clear, EOM's intact, fundi    benign, both eyes  Ears:    Normal TM's and external ear canals, both ears  Nose:   Nares normal, septum midline, mucosa normal, no drainage    or sinus tenderness  Throat:   Lips, mucosa, and tongue normal; teeth and gums normal  Neck:   Supple, symmetrical, trachea midline, no adenopathy;    thyroid:  no enlargement/tenderness/nodules; no carotid   bruit or JVD  Back:     Symmetric, no curvature, ROM normal, no CVA tenderness  Lungs:     Clear to auscultation bilaterally, respirations unlabored  Chest Wall:    No tenderness or deformity   Heart:    Regular rate and rhythm, S1 and S2 normal, no murmur, rub   or gallop  Breast Exam:    No tenderness, masses, or nipple abnormality  Abdomen:     Soft, non-tender, bowel sounds active all four quadrants,    no masses, no organomegaly  Genitalia:    Normal female without lesion, discharge or tenderness  Extremities:   Extremities normal, atraumatic, no cyanosis or edema  Pulses:   2+ and symmetric all extremities  Skin:  Skin color, texture, turgor normal, no rashes or lesions  Lymph nodes:   Cervical, supraclavicular, and axillary nodes normal  Neurologic:   CNII-XII intact, normal strength, sensation and reflexes    throughout     FH:  23 cm,  FHR by doppler 140.  Quick [place] scan:    [place] prenancy     Lab Review Urine pregnancy test Labs reviewed yes Radiologic studies reviewed yes Assessment:    Pregnancy at [redacted]w[redacted]d weeks   Transfer from Dr. Gaynell Face  wks: records reviewed   Tobacco abuse counseling: started wellbutrin  Plan:      Prenatal vitamins.  Counseling provided regarding continued use of seat belts, cessation of alcohol consumption, smoking or use of illicit drugs; infection precautions i.e., influenza/TDAP immunizations, toxoplasmosis,CMV, parvovirus,  listeria and varicella; workplace safety, exercise during pregnancy; routine dental care, safe medications, sexual activity, hot tubs, saunas, pools, travel, caffeine use, fish and methlymercury, potential toxins, hair treatments, varicose veins Weight gain recommendations per IOM guidelines reviewed: underweight/BMI< 18.5--> gain 28 - 40 lbs; normal weight/BMI 18.5 - 24.9--> gain 25 - 35 lbs; overweight/BMI 25 - 29.9--> gain 15 - 25 lbs; obese/BMI >30->gain  11 - 20 lbs Problem list reviewed and updated. FIRST/CF mutation testing/NIPT/QUAD SCREEN/fragile X/Ashkenazi Jewish population testing/Spinal muscular atrophy discussed: ordered. Role of ultrasound in pregnancy discussed; fetal survey: requested. Amniocentesis discussed: not indicated. VBAC calculator score: VBAC consent form provided No orders of the defined types were placed in this encounter.  Orders Placed This Encounter  Procedures  . POCT Urinalysis Dipstick    Follow up in 4 weeks. 50% of 30 min visit spent on counseling and coordination of care.

## 2016-05-29 NOTE — Progress Notes (Signed)
Pt does not have any concerns other than wanting to have an [place] to find out the gender of her baby.

## 2016-06-04 ENCOUNTER — Ambulatory Visit (INDEPENDENT_AMBULATORY_CARE_PROVIDER_SITE_OTHER): Payer: Medicaid Other

## 2016-06-04 DIAGNOSIS — Z36 Encounter for antenatal screening of mother: Secondary | ICD-10-CM | POA: Diagnosis not present

## 2016-06-04 DIAGNOSIS — Z3482 Encounter for supervision of other normal pregnancy, second trimester: Secondary | ICD-10-CM

## 2016-06-25 ENCOUNTER — Encounter: Payer: Medicaid Other | Admitting: Certified Nurse Midwife

## 2016-06-26 ENCOUNTER — Encounter (HOSPITAL_COMMUNITY): Payer: Self-pay | Admitting: *Deleted

## 2016-06-26 ENCOUNTER — Inpatient Hospital Stay (HOSPITAL_COMMUNITY)
Admission: AD | Admit: 2016-06-26 | Discharge: 2016-07-09 | DRG: 766 | Disposition: A | Discharge status: Home / Self Care | Payer: Medicaid Other | Source: Ambulatory Visit | Attending: Obstetrics and Gynecology | Admitting: Obstetrics and Gynecology

## 2016-06-26 ENCOUNTER — Other Ambulatory Visit: Payer: Medicaid Other

## 2016-06-26 ENCOUNTER — Encounter: Payer: Medicaid Other | Admitting: Certified Nurse Midwife

## 2016-06-26 DIAGNOSIS — R0602 Shortness of breath: Secondary | ICD-10-CM

## 2016-06-26 DIAGNOSIS — O1414 Severe pre-eclampsia complicating childbirth: Secondary | ICD-10-CM | POA: Diagnosis not present

## 2016-06-26 DIAGNOSIS — R609 Edema, unspecified: Secondary | ICD-10-CM | POA: Diagnosis not present

## 2016-06-26 DIAGNOSIS — F17210 Nicotine dependence, cigarettes, uncomplicated: Secondary | ICD-10-CM | POA: Diagnosis present

## 2016-06-26 DIAGNOSIS — K219 Gastro-esophageal reflux disease without esophagitis: Secondary | ICD-10-CM | POA: Diagnosis present

## 2016-06-26 DIAGNOSIS — O1413 Severe pre-eclampsia, third trimester: Secondary | ICD-10-CM

## 2016-06-26 DIAGNOSIS — O1490 Unspecified pre-eclampsia, unspecified trimester: Secondary | ICD-10-CM | POA: Diagnosis present

## 2016-06-26 DIAGNOSIS — Z3A27 27 weeks gestation of pregnancy: Secondary | ICD-10-CM | POA: Diagnosis not present

## 2016-06-26 DIAGNOSIS — Z3A28 28 weeks gestation of pregnancy: Secondary | ICD-10-CM | POA: Diagnosis not present

## 2016-06-26 DIAGNOSIS — O1204 Gestational edema, complicating childbirth: Secondary | ICD-10-CM | POA: Diagnosis present

## 2016-06-26 DIAGNOSIS — O99324 Drug use complicating childbirth: Secondary | ICD-10-CM | POA: Diagnosis present

## 2016-06-26 DIAGNOSIS — O9962 Diseases of the digestive system complicating childbirth: Secondary | ICD-10-CM | POA: Diagnosis present

## 2016-06-26 DIAGNOSIS — O1492 Unspecified pre-eclampsia, second trimester: Secondary | ICD-10-CM

## 2016-06-26 DIAGNOSIS — F1220 Cannabis dependence, uncomplicated: Secondary | ICD-10-CM | POA: Diagnosis present

## 2016-06-26 DIAGNOSIS — O99334 Smoking (tobacco) complicating childbirth: Secondary | ICD-10-CM | POA: Diagnosis present

## 2016-06-26 DIAGNOSIS — O09299 Supervision of pregnancy with other poor reproductive or obstetric history, unspecified trimester: Secondary | ICD-10-CM | POA: Diagnosis present

## 2016-06-26 DIAGNOSIS — M79609 Pain in unspecified limb: Secondary | ICD-10-CM | POA: Diagnosis not present

## 2016-06-26 DIAGNOSIS — O1493 Unspecified pre-eclampsia, third trimester: Secondary | ICD-10-CM | POA: Diagnosis present

## 2016-06-26 HISTORY — DX: Pain due to genitourinary prosthetic devices, implants and grafts, initial encounter: T83.84XA

## 2016-06-26 LAB — URINALYSIS, ROUTINE W REFLEX MICROSCOPIC
GLUCOSE, UA: NEGATIVE mg/dL
KETONES UR: NEGATIVE mg/dL
LEUKOCYTES UA: NEGATIVE
NITRITE: NEGATIVE
PH: 6 (ref 5.0–8.0)
Specific Gravity, Urine: 1.025 (ref 1.005–1.030)

## 2016-06-26 LAB — CBC
HEMATOCRIT: 30.6 % — AB (ref 36.0–46.0)
HEMOGLOBIN: 10.4 g/dL — AB (ref 12.0–15.0)
MCH: 28.7 pg (ref 26.0–34.0)
MCHC: 34 g/dL (ref 30.0–36.0)
MCV: 84.3 fL (ref 78.0–100.0)
Platelets: 128 10*3/uL — ABNORMAL LOW (ref 150–400)
RBC: 3.63 MIL/uL — AB (ref 3.87–5.11)
RDW: 13.3 % (ref 11.5–15.5)
WBC: 6.7 10*3/uL (ref 4.0–10.5)

## 2016-06-26 LAB — COMPREHENSIVE METABOLIC PANEL
ALBUMIN: 2.5 g/dL — AB (ref 3.5–5.0)
ALK PHOS: 76 U/L (ref 38–126)
ALT: 20 U/L (ref 14–54)
AST: 22 U/L (ref 15–41)
Anion gap: 5 (ref 5–15)
BILIRUBIN TOTAL: 0.5 mg/dL (ref 0.3–1.2)
BUN: 16 mg/dL (ref 6–20)
CHLORIDE: 110 mmol/L (ref 101–111)
CO2: 19 mmol/L — AB (ref 22–32)
Calcium: 8.3 mg/dL — ABNORMAL LOW (ref 8.9–10.3)
Creatinine, Ser: 0.76 mg/dL (ref 0.44–1.00)
GFR calc Af Amer: >60 mL/min (ref >60)
GFR calc non Af Amer: >60 mL/min (ref >60)
Glucose, Bld: 73 mg/dL (ref 65–99)
POTASSIUM: 3.7 mmol/L (ref 3.5–5.1)
SODIUM: 134 mmol/L — AB (ref 135–145)
Total Protein: 5.7 g/dL — ABNORMAL LOW (ref 6.5–8.1)

## 2016-06-26 LAB — TYPE AND SCREEN
ABO/RH(D): O POS
ANTIBODY SCREEN: NEGATIVE

## 2016-06-26 LAB — URINE MICROSCOPIC-ADD ON

## 2016-06-26 LAB — PROTEIN / CREATININE RATIO, URINE: Creatinine, Urine: 417 mg/dL

## 2016-06-26 MED ORDER — LABETALOL HCL 5 MG/ML IV SOLN
20.0000 mg | INTRAVENOUS | Status: AC | PRN
  Administered 2016-07-06 (×2): 20 mg via INTRAVENOUS
  Filled 2016-06-26: qty 4
  Filled 2016-06-26: qty 8
  Filled 2016-06-26 (×4): qty 4

## 2016-06-26 MED ORDER — HYDRALAZINE HCL 20 MG/ML IJ SOLN
5.0000 mg | INTRAMUSCULAR | Status: AC | PRN
  Administered 2016-06-26 – 2016-06-27 (×2): 10 mg via INTRAVENOUS
  Filled 2016-06-26 (×2): qty 1

## 2016-06-26 MED ORDER — MAGNESIUM SULFATE 50 % IJ SOLN
2.0000 g/h | INTRAVENOUS | Status: DC
  Administered 2016-06-26 – 2016-06-27 (×2): 2 g/h via INTRAVENOUS
  Filled 2016-06-26 (×2): qty 80

## 2016-06-26 MED ORDER — LACTATED RINGERS IV SOLN
INTRAVENOUS | Status: DC
  Administered 2016-06-26 – 2016-06-28 (×6): via INTRAVENOUS

## 2016-06-26 MED ORDER — HYDRALAZINE HCL 20 MG/ML IJ SOLN
5.0000 mg | INTRAMUSCULAR | Status: AC | PRN
  Administered 2016-06-26: 10 mg via INTRAVENOUS
  Administered 2016-06-26: 5 mg via INTRAVENOUS
  Filled 2016-06-26: qty 1

## 2016-06-26 MED ORDER — ZOLPIDEM TARTRATE 5 MG PO TABS: 5.0000 mg | ORAL_TABLET | Freq: Every evening | ORAL | Status: DC | PRN

## 2016-06-26 MED ORDER — BUPROPION HCL ER (SR) 150 MG PO TB12
150.0000 mg | ORAL_TABLET | Freq: Two times a day (BID) | ORAL | Status: DC
  Administered 2016-06-26 – 2016-07-02 (×12): 150 mg via ORAL
  Filled 2016-06-26 (×26): qty 1

## 2016-06-26 MED ORDER — LABETALOL HCL 5 MG/ML IV SOLN
20.0000 mg | INTRAVENOUS | Status: DC | PRN
  Administered 2016-06-26: 20 mg via INTRAVENOUS
  Filled 2016-06-26: qty 4

## 2016-06-26 MED ORDER — ACETAMINOPHEN 325 MG PO TABS
650.0000 mg | ORAL_TABLET | ORAL | Status: DC | PRN
  Administered 2016-06-26 – 2016-07-01 (×4): 650 mg via ORAL
  Filled 2016-06-26 (×4): qty 2

## 2016-06-26 MED ORDER — BETAMETHASONE SOD PHOS & ACET 6 (3-3) MG/ML IJ SUSP
12.0000 mg | INTRAMUSCULAR | Status: AC
  Administered 2016-06-26 – 2016-06-27 (×2): 12 mg via INTRAMUSCULAR
  Filled 2016-06-26 (×2): qty 2

## 2016-06-26 MED ORDER — PRENATAL MULTIVITAMIN CH
1.0000 | ORAL_TABLET | Freq: Every day | ORAL | Status: DC
  Administered 2016-06-28 – 2016-07-01 (×4): 1 via ORAL
  Filled 2016-06-26 (×8): qty 1

## 2016-06-26 MED ORDER — DOCUSATE SODIUM 100 MG PO CAPS
100.0000 mg | ORAL_CAPSULE | Freq: Every day | ORAL | Status: DC
  Administered 2016-07-08: 100 mg via ORAL
  Filled 2016-06-26 (×6): qty 1

## 2016-06-26 MED ORDER — PANTOPRAZOLE SODIUM 40 MG PO TBEC
40.0000 mg | DELAYED_RELEASE_TABLET | Freq: Every day | ORAL | Status: DC
  Administered 2016-06-26 – 2016-06-29 (×3): 40 mg via ORAL
  Filled 2016-06-26 (×8): qty 1

## 2016-06-26 MED ORDER — ONDANSETRON HCL 4 MG PO TABS: 4.0000 mg | ORAL_TABLET | Freq: Three times a day (TID) | ORAL | Status: DC | PRN

## 2016-06-26 MED ORDER — MAGNESIUM SULFATE BOLUS VIA INFUSION
4.0000 g | Freq: Once | INTRAVENOUS | Status: AC
Start: 2016-06-26 — End: 2016-06-26
  Administered 2016-06-26: 4 g via INTRAVENOUS
  Filled 2016-06-26: qty 500

## 2016-06-26 MED ORDER — CALCIUM CARBONATE ANTACID 500 MG PO CHEW
2.0000 | CHEWABLE_TABLET | ORAL | Status: DC | PRN
  Administered 2016-06-28: 400 mg via ORAL
  Filled 2016-06-26: qty 2

## 2016-06-26 NOTE — Progress Notes (Signed)
[facility]  Patient to be admitted to room 154.

## 2016-06-26 NOTE — MAU Note (Signed)
Up to BR. States she had a loose stool.

## 2016-06-26 NOTE — Progress Notes (Signed)
Called antenatal to give report for admission.  Debbie took message.  Will have charge RN call back for report.

## 2016-06-26 NOTE — H&P (Signed)
Marga MelnickLashonda M Sallie is a 30 y.o. female [phone] pt of [facility] presenting for facial and leg swelling  x2 days and n/v and diarrhea x 1 month.  She has no hx of HTN prepregnancy or during her previous pregnancies.    She is admitted for preeclampsia with severe range BPs on arrival, well managed by IV hydralazine.  She denies h/a, epigastric pain, or visual disturbances. She reports good fetal movement, denies LOF, vaginal bleeding, vaginal itching/burning, urinary symptoms, dizziness, or fever/chills.     OB History    Gravida Para Term Preterm AB Living   3 2 1 1   2    SAB TAB Ectopic Multiple Live Births                 Past Medical History:  Diagnosis Date  . Anxiety   . Chlamydia infection 02/03/2012  . IBS (irritable bowel syndrome)   . Migraines   . Seasonal allergies    Past Surgical History:  Procedure Laterality Date  . HYSTEROSCOPY N/A 01/17/2015   Procedure: HYSTEROSCOPY WITH REMOVAL INTRAUTERINE DIVICE;  Surgeon: Willodean Rosenthalarolyn Harraway-Smith, MD;  Location: [facility];  Service: Gynecology;  Laterality: N/A;  . IUD REMOVAL    . MOUTH SURGERY    . NO PAST SURGERIES     Family History: family history is not on file. Social History:  reports that she has been smoking Cigarettes.  She has a 9.00 pack-year smoking history. She has never used smokeless tobacco. She reports that she uses drugs, including Marijuana. She reports that she does not drink alcohol.     Maternal Diabetes: No Genetic Screening:  Maternal Ultrasounds/Referrals: Normal Fetal Ultrasounds or other Referrals:  None Maternal Substance Abuse:  No Significant Maternal Medications:  None Significant Maternal Lab Results:  None Other Comments:  Genetic screening NIPS testing pending  ROS Maternal Medical History:  Reason for admission: HTN  Contractions: Frequency: rare.   Perceived severity is mild.    Fetal activity: Perceived fetal activity is normal.   Last perceived fetal movement was within the past hour.     Prenatal Complications - Diabetes: none.      Blood pressure 139/97, pulse 86, temperature 98.8 F (37.1 C), temperature source Oral, resp. rate 16, height 5\' 4"  (1.626 m), weight 188 lb (85.3 kg), last menstrual period 12/20/2015, SpO2 100 %. Maternal Exam:  Uterine Assessment: Contraction strength is mild.  Contraction frequency is rare.      Fetal Exam Fetal Monitor Review: Mode: ultrasound.   Baseline rate: 135.  Variability: moderate (6-25 bpm).   Pattern: accelerations present and no decelerations.    Fetal State Assessment: Category I - tracings are normal.     Physical Exam  Nursing note and vitals reviewed. Constitutional: She is oriented to person, place, and time. She appears well-developed and well-nourished.  Neck: Normal range of motion.  Cardiovascular: Normal rate, regular rhythm and normal heart sounds.   Respiratory: Effort normal and breath sounds normal.  GI: Soft.  Musculoskeletal: Normal range of motion.  Neurological: She is alert and oriented to person, place, and time. She has normal reflexes.  Skin: Skin is warm and dry.  Psychiatric: She has a normal mood and affect. Her behavior is normal. Judgment and thought content normal.    Prenatal labs: ABO, Rh:   Antibody:   Rubella: Immune (06/28 0000) RPR: Nonreactive (06/28 0000)  HBsAg:    HIV: Non-reactive (06/28 0000)  GBS:     Assessment/Plan: [id number] @[redacted]w[redacted]d  by LMP Preeclampsia ?  With or without severe features N/V of pregnancy GERD   Admit to Antepartum Magnesium sulfate 4g then 2g/hour BMZ daily x 2 Repeat preeclampsia labs in am US for growth and dopplers in am 24 hour urine collection Zofran 4 mg PO Q 8 hours PRN Pepcid 40 mg PO Q 12 hours Continuous EFM   LEFTWICH-KIRBY, LISA 06/26/2016, 6:24 PM

## 2016-06-26 NOTE — MAU Note (Signed)
Patient states symptoms started at the end of July when she thought she had gotten food poisoning.  She has had diarrhea and vomiting on and off for past few weeks.  Also started having swelling in her feet "that will not go down."  Reports +fetal movement.  Denies vaginal bleeding, LOF or abnormal discharge.

## 2016-06-27 ENCOUNTER — Inpatient Hospital Stay (HOSPITAL_COMMUNITY): Payer: Medicaid Other

## 2016-06-27 ENCOUNTER — Other Ambulatory Visit (HOSPITAL_COMMUNITY): Payer: Self-pay | Admitting: Radiology

## 2016-06-27 LAB — COMPREHENSIVE METABOLIC PANEL
ALBUMIN: 2.7 g/dL — AB (ref 3.5–5.0)
ALK PHOS: 84 U/L (ref 38–126)
ALT: 22 U/L (ref 14–54)
AST: 27 U/L (ref 15–41)
Anion gap: 8 (ref 5–15)
BILIRUBIN TOTAL: 0.4 mg/dL (ref 0.3–1.2)
BUN: 17 mg/dL (ref 6–20)
CALCIUM: 8.1 mg/dL — AB (ref 8.9–10.3)
CO2: 18 mmol/L — ABNORMAL LOW (ref 22–32)
CREATININE: 0.86 mg/dL (ref 0.44–1.00)
Chloride: 108 mmol/L (ref 101–111)
GFR calc Af Amer: >60 mL/min (ref >60)
GFR calc non Af Amer: >60 mL/min (ref >60)
GLUCOSE: 139 mg/dL — AB (ref 65–99)
Potassium: 3.6 mmol/L (ref 3.5–5.1)
Sodium: 134 mmol/L — ABNORMAL LOW (ref 135–145)
TOTAL PROTEIN: 6.2 g/dL — AB (ref 6.5–8.1)

## 2016-06-27 LAB — CBC
HEMATOCRIT: 33.6 % — AB (ref 36.0–46.0)
HEMOGLOBIN: 12.2 g/dL (ref 12.0–15.0)
MCH: 30.3 pg (ref 26.0–34.0)
MCHC: 36.3 g/dL — ABNORMAL HIGH (ref 30.0–36.0)
MCV: 83.6 fL (ref 78.0–100.0)
Platelets: 144 10*3/uL — ABNORMAL LOW (ref 150–400)
RBC: 4.02 MIL/uL (ref 3.87–5.11)
RDW: 13.6 % (ref 11.5–15.5)
WBC: 10.3 10*3/uL (ref 4.0–10.5)

## 2016-06-27 LAB — OB RESULTS CONSOLE GBS: GBS: NEGATIVE

## 2016-06-27 MED ORDER — LABETALOL HCL 100 MG PO TABS
200.0000 mg | ORAL_TABLET | Freq: Three times a day (TID) | ORAL | Status: DC
  Administered 2016-06-27 (×3): 200 mg via ORAL
  Filled 2016-06-27 (×3): qty 2

## 2016-06-27 MED ORDER — PROMETHAZINE HCL 25 MG/ML IJ SOLN
25.0000 mg | Freq: Four times a day (QID) | INTRAMUSCULAR | Status: DC | PRN
  Administered 2016-06-27: 25 mg via INTRAVENOUS
  Filled 2016-06-27: qty 1

## 2016-06-27 MED ORDER — BUTORPHANOL TARTRATE 1 MG/ML IJ SOLN
2.0000 mg | Freq: Once | INTRAMUSCULAR | Status: AC
  Administered 2016-06-27: 2 mg via INTRAVENOUS
  Filled 2016-06-27: qty 2

## 2016-06-27 MED ORDER — HYDRALAZINE HCL 20 MG/ML IJ SOLN
10.0000 mg | INTRAMUSCULAR | Status: DC | PRN
  Administered 2016-06-29 – 2016-07-06 (×5): 10 mg via INTRAVENOUS
  Filled 2016-06-27 (×6): qty 1

## 2016-06-27 NOTE — Progress Notes (Signed)
Dr. Genevie AnnSchenk has come and spoken with patient. Patient has agreed to continue Magnesium infusion and to PRN IV medications for blood pressure. NICU consult ordered by Genevie AnnSchenk and patient was advised that it is likely she will be induced Friday at approx. 2359.

## 2016-06-27 NOTE — Progress Notes (Signed)
FACULTY PRACTICE ANTEPARTUM(COMPREHENSIVE) NOTE  Vanessa MelnickLashonda M Wise is a 30 y.o. [phone] with Estimated Date of Delivery: 09/25/16   By  LMP, early ultrasound 5689w1d  who is admitted for pre eclampsia evaluation.    Fetal presentation is unsure. Length of Stay:  1  Days  Date of admission:06/26/2016  Subjective: Pt continues to have a bilateral frontal headache Patient reports the fetal movement as active. Patient reports uterine contraction  activity as none. Patient reports  vaginal bleeding as none. Patient describes fluid per vagina as None.  Vitals:  Blood pressure (!) 155/98, pulse 92, temperature 98.4 F (36.9 C), temperature source Oral, resp. rate 18, height 5\' 4"  (1.626 m), weight 188 lb (85.3 kg), last menstrual period 12/20/2015, SpO2 100 %. Vitals:   06/27/16 1506 06/27/16 1602 06/27/16 1702 06/27/16 2002  BP: (!) 145/102 (!) 157/95 (!) 153/95 (!) 155/98  Pulse: 93 91 99 92  Resp: 18 18 18 18   Temp:    98.4 F (36.9 C)  TempSrc:    Oral  SpO2:      Weight:      Height:       Physical Examination:  General appearance - alert, well appearing, and in no distress Heart - normal rate, regular rhythm, normal S1, S2, no murmurs, rubs, clicks or gallops Abdomen - benign Fundal Height:  size equals dates Pelvic Exam:   Cervical Exam:  and found to be / / and fetal presentation is . Extremities: extremities normal, atraumatic, no cyanosis or edema with DTRs 2+ bilaterally Membranes:intact  Fetal Monitoring:  Baseline: 140 bpm, Variability: Good {> 6 bpm) and Accelerations: Non-reactive but appropriate for gestational age   equivocal  Labs:  Results for orders placed or performed during the hospital encounter of 06/26/16 (from the past 24 hour(s))  CBC   Collection Time: 06/27/16  6:15 AM  Result Value Ref Range   WBC 10.3 4.0 - 10.5 K/uL   RBC 4.02 3.87 - 5.11 MIL/uL   Hemoglobin 12.2 12.0 - 15.0 g/dL   HCT 96.233.6 (L) 95.236.0 - 84.146.0 %   MCV 83.6 78.0 - 100.0 fL   MCH 30.3  26.0 - 34.0 pg   MCHC 36.3 (H) 30.0 - 36.0 g/dL   RDW 32.413.6 40.111.5 - 02.715.5 %   Platelets 144 (L) 150 - 400 K/uL  Comprehensive metabolic panel   Collection Time: 06/27/16  6:15 AM  Result Value Ref Range   Sodium 134 (L) 135 - 145 mmol/L   Potassium 3.6 3.5 - 5.1 mmol/L   Chloride 108 101 - 111 mmol/L   CO2 18 (L) 22 - 32 mmol/L   Glucose, Bld 139 (H) 65 - 99 mg/dL   BUN 17 6 - 20 mg/dL   Creatinine, Ser 2.530.86 0.44 - 1.00 mg/dL   Calcium 8.1 (L) 8.9 - 10.3 mg/dL   Total Protein 6.2 (L) 6.5 - 8.1 g/dL   Albumin 2.7 (L) 3.5 - 5.0 g/dL   AST 27 15 - 41 U/L   ALT 22 14 - 54 U/L   Alkaline Phosphatase 84 38 - 126 U/L   Total Bilirubin 0.4 0.3 - 1.2 mg/dL   GFR calc non Af Amer >60 >60 mL/min   GFR calc Af Amer >60 >60 mL/min   Anion gap 8 5 - 15    Imaging Studies:     Medications:  Scheduled . buPROPion  150 mg Oral BID  . docusate sodium  100 mg Oral Daily  . labetalol  200 mg  Oral TID  . pantoprazole  40 mg Oral Daily  . prenatal multivitamin  1 tablet Oral Q1200   I have reviewed the patient's current medications.  ASSESSMENT: [id number] 2424w1d Estimated Date of Delivery: 09/25/16  Pre eclampsia with severe features Patient Active Problem List   Diagnosis Date Noted  . Preeclampsia 06/26/2016  . Pain due to intrauterine contraceptive device (IUD) (HCC) 01/17/2015  . Cannabis dependence 01/28/2012  . Chronic migraine 01/28/2012  . TOBACCO USER 11/06/2009    PLAN: Continue MgSO4 prophylaxis, CP and seizure Full course of betamethasone Begin labetalol 200 TID with BP at severe level Sonogram later today  EURE,LUTHER H 06/27/2016,10:01 PM

## 2016-06-27 NOTE — Progress Notes (Signed)
Magnesium off. Empty bag and patient refusing magnesium infusion. Called to Dr. Genevie AnnSchenk at 1330 per pt request.

## 2016-06-27 NOTE — Consult Note (Signed)
Neonatology Consult  Note:  At the request of the patients obstetrician Dr.Schenk     I met with Vanessa Wise    who is at 70   wks currently with preg complicated by pregnancy induced hypertension/preeclampsia with severe features, currently being treated with MgSO4 and planned induction to begin tomorrow after two doses of betamethasone.    We reviewed initial delivery room management, and low but certainly possible need for intubation for surfactant administration.  We discussed feeding immaturity and need for full po intake with multiple days of good weight gain and no apnea or bradycardia before discharge.    I emphasized the desirability of [name] to provide milk for her newborn and the need for her to abstain from any behaviors that could interfere, such as cigarette and marijuana use.  Thank you for allowing [place] to participate in her care.  Please call with questions.  Jonetta Osgood M.D.  Neonatologist  The total length of face-to-face or floor / unit time for this encounter was 20 minutes.  Counseling and / or coordination of care was greater than fifty percent of the time.

## 2016-06-27 NOTE — Progress Notes (Signed)
Patient has been on and off the monitor since 0700 this morning. Dr. Despina HiddenEure aware. Pt up to bathroom and vomiting and unable to get comfortable in the bed when she is actually in the bed. Continuous monitoring ordered, educated patient.

## 2016-06-28 DIAGNOSIS — O1493 Unspecified pre-eclampsia, third trimester: Secondary | ICD-10-CM

## 2016-06-28 LAB — COMPREHENSIVE METABOLIC PANEL
ALK PHOS: 78 U/L (ref 38–126)
ALT: 20 U/L (ref 14–54)
ANION GAP: 7 (ref 5–15)
AST: 26 U/L (ref 15–41)
Albumin: 2.5 g/dL — ABNORMAL LOW (ref 3.5–5.0)
BILIRUBIN TOTAL: 0.4 mg/dL (ref 0.3–1.2)
BUN: 16 mg/dL (ref 6–20)
CALCIUM: 7.4 mg/dL — AB (ref 8.9–10.3)
CO2: 20 mmol/L — AB (ref 22–32)
Chloride: 106 mmol/L (ref 101–111)
Creatinine, Ser: 0.97 mg/dL (ref 0.44–1.00)
GFR calc non Af Amer: >60 mL/min (ref >60)
Glucose, Bld: 132 mg/dL — ABNORMAL HIGH (ref 65–99)
Potassium: 3.8 mmol/L (ref 3.5–5.1)
SODIUM: 133 mmol/L — AB (ref 135–145)
TOTAL PROTEIN: 5.7 g/dL — AB (ref 6.5–8.1)

## 2016-06-28 LAB — CBC
HCT: 31.8 % — ABNORMAL LOW (ref 36.0–46.0)
HEMOGLOBIN: 11.2 g/dL — AB (ref 12.0–15.0)
MCH: 29.6 pg (ref 26.0–34.0)
MCHC: 35.2 g/dL (ref 30.0–36.0)
MCV: 84.1 fL (ref 78.0–100.0)
Platelets: 141 10*3/uL — ABNORMAL LOW (ref 150–400)
RBC: 3.78 MIL/uL — ABNORMAL LOW (ref 3.87–5.11)
RDW: 13.8 % (ref 11.5–15.5)
WBC: 12.4 10*3/uL — ABNORMAL HIGH (ref 4.0–10.5)

## 2016-06-28 LAB — PROTEIN, URINE, 24 HOUR
Collection Interval-UPROT: 24 hours
PROTEIN, URINE: 1860 mg/dL
Protein, 24H Urine: 12090 mg/d — ABNORMAL HIGH (ref 50–100)
URINE TOTAL VOLUME-UPROT: 650 mL

## 2016-06-28 MED ORDER — LABETALOL HCL 300 MG PO TABS
400.0000 mg | ORAL_TABLET | Freq: Three times a day (TID) | ORAL | Status: DC
  Administered 2016-06-28 – 2016-07-01 (×8): 400 mg via ORAL
  Filled 2016-06-28 (×8): qty 1

## 2016-06-28 MED ORDER — LABETALOL HCL 300 MG PO TABS
400.0000 mg | ORAL_TABLET | Freq: Two times a day (BID) | ORAL | Status: DC
  Administered 2016-06-28: 400 mg via ORAL
  Filled 2016-06-28: qty 1

## 2016-06-28 NOTE — Progress Notes (Signed)
FACULTY PRACTICE ANTEPARTUM(COMPREHENSIVE) NOTE  Vanessa Wise is a 30 y.o. [id number] at [redacted]w[redacted]d by LMP who is admitted for severe preeclampsia.   Fetal presentation is cephalic. Length of Stay:  2  Days  Subjective: Headache currently resolved Patient reports the fetal movement as active. Patient reports uterine contraction  activity as none. Patient reports  vaginal bleeding as none. Patient describes fluid per vagina as None.  Vitals:  Blood pressure (!) 153/94, pulse 81, temperature 99.2 F (37.3 C), temperature source Oral, resp. rate 18, height 5\' 4"  (1.626 m), weight 85.3 kg (188 lb), last menstrual period 12/20/2015, SpO2 99 %. Physical Examination:  General appearance - alert, well appearing, and in no distress Heart - normal rate and regular rhythm Abdomen - soft, nontender, nondistended Fundal Height:  size equals dates Cervical Exam: Not evaluated. Extremities: extremities normal, atraumatic, no cyanosis , 1+ edema and Homans sign is negative, no sign of DVT with DTRs 1+ bilaterally Membranes:intact  Fetal Monitoring:     Fetal Heart Rate A  Mode External filed at 06/28/2016 0913  Baseline Rate (A) -- [fhts 135, assessing] filed at 06/28/2016 0913  Variability <5 BPM filed at 06/28/2016 0800  Accelerations 10 x 10 filed at 06/28/2016 0800  Decelerations Variable filed at 06/28/2016 0800  Multiple birth? N filed at 06/26/2016 1715     Labs:  Results for orders placed or performed during the hospital encounter of 06/26/16 (from the past 24 hour(s))  CBC   Collection Time: 06/28/16  6:12 AM  Result Value Ref Range   WBC 12.4 (H) 4.0 - 10.5 K/uL   RBC 3.78 (L) 3.87 - 5.11 MIL/uL   Hemoglobin 11.2 (L) 12.0 - 15.0 g/dL   HCT 45.4 (L) 09.8 - 11.9 %   MCV 84.1 78.0 - 100.0 fL   MCH 29.6 26.0 - 34.0 pg   MCHC 35.2 30.0 - 36.0 g/dL   RDW 14.7 82.9 - 56.2 %   Platelets 141 (L) 150 - 400 K/uL  Comprehensive metabolic panel   Collection Time: 06/28/16  6:12 AM   Result Value Ref Range   Sodium 133 (L) 135 - 145 mmol/L   Potassium 3.8 3.5 - 5.1 mmol/L   Chloride 106 101 - 111 mmol/L   CO2 20 (L) 22 - 32 mmol/L   Glucose, Bld 132 (H) 65 - 99 mg/dL   BUN 16 6 - 20 mg/dL   Creatinine, Ser 1.30 0.44 - 1.00 mg/dL   Calcium 7.4 (L) 8.9 - 10.3 mg/dL   Total Protein 5.7 (L) 6.5 - 8.1 g/dL   Albumin 2.5 (L) 3.5 - 5.0 g/dL   AST 26 15 - 41 U/L   ALT 20 14 - 54 U/L   Alkaline Phosphatase 78 38 - 126 U/L   Total Bilirubin 0.4 0.3 - 1.2 mg/dL   GFR calc non Af Amer >60 >60 mL/min   GFR calc Af Amer >60 >60 mL/min   Anion gap 7 5 - 15   > 12 g urne 24 hr protein CMP Latest Ref Rng & Units 06/28/2016 06/27/2016 06/26/2016  Glucose 65 - 99 mg/dL 865(H) 846(N) 73  BUN 6 - 20 mg/dL 16 17 16   Creatinine 0.44 - 1.00 mg/dL 6.29 5.28 4.13  Sodium 135 - 145 mmol/L 133(L) 134(L) 134(L)  Potassium 3.5 - 5.1 mmol/L 3.8 3.6 3.7  Chloride 101 - 111 mmol/L 106 108 110  CO2 22 - 32 mmol/L 20(L) 18(L) 19(L)  Calcium 8.9 - 10.3 mg/dL 7.4(L) 8.1(L) 8.3(L)  Total Protein 6.5 - 8.1 g/dL 1.6(X5.7(L) 6.2(L) 5.7(L)  Total Bilirubin 0.3 - 1.2 mg/dL 0.4 0.4 0.5  Alkaline Phos 38 - 126 U/L 78 84 76  AST 15 - 41 U/L 26 27 22   ALT 14 - 54 U/L 20 22 20    CBC Latest Ref Rng & Units 06/28/2016 06/27/2016 06/26/2016  WBC 4.0 - 10.5 K/uL 12.4(H) 10.3 6.7  Hemoglobin 12.0 - 15.0 g/dL 11.2(L) 12.2 10.4(L)  Hematocrit 36.0 - 46.0 % 31.8(L) 33.6(L) 30.6(L)  Platelets 150 - 400 K/uL 141(L) 144(L) 128(L)     Imaging Studies:    Currently EPIC will not allow sonographic studies to automatically populate into notes.  In the meantime, copy and paste results into note or free text. US result reviewed, nl afi and cephalic Medications:  Scheduled . buPROPion  150 mg Oral BID  . docusate sodium  100 mg Oral Daily  . labetalol  400 mg Oral BID  . pantoprazole  40 mg Oral Daily  . prenatal multivitamin  1 tablet Oral Q1200   I have reviewed the patient's current  medications.  ASSESSMENT: Patient Active Problem List   Diagnosis Date Noted  . Preeclampsia 06/26/2016  . Pain due to intrauterine contraceptive device (IUD) (HCC) 01/17/2015  . Cannabis dependence 01/28/2012  . Chronic migraine 01/28/2012  . TOBACCO USER 11/06/2009    PLAN: Discussed with Dr. Claudean SeveranceWhitecar. Plan to continue hospitalization with fetal monitoring TID, oral antihypertensive therapy with goal to keep BP below severe range, watch for significant change in LFT, platelets, Cr Or worsening symptoms. Increased labetalol to 400 BID, d/c magnesium  ARNOLD,JAMES 06/28/2016,10:26 AM

## 2016-06-29 DIAGNOSIS — O1492 Unspecified pre-eclampsia, second trimester: Secondary | ICD-10-CM

## 2016-06-29 LAB — URINALYSIS, ROUTINE W REFLEX MICROSCOPIC
Bilirubin Urine: NEGATIVE
Glucose, UA: NEGATIVE mg/dL
Ketones, ur: NEGATIVE mg/dL
Leukocytes, UA: NEGATIVE
NITRITE: NEGATIVE
PH: 6.5 (ref 5.0–8.0)
Protein, ur: >300 mg/dL — AB
SPECIFIC GRAVITY, URINE: 1.025 (ref 1.005–1.030)

## 2016-06-29 LAB — COMPREHENSIVE METABOLIC PANEL
ALBUMIN: 2.4 g/dL — AB (ref 3.5–5.0)
ALT: 28 U/L (ref 14–54)
AST: 32 U/L (ref 15–41)
Alkaline Phosphatase: 78 U/L (ref 38–126)
Anion gap: 5 (ref 5–15)
BILIRUBIN TOTAL: 0.3 mg/dL (ref 0.3–1.2)
BUN: 20 mg/dL (ref 6–20)
CHLORIDE: 109 mmol/L (ref 101–111)
CO2: 22 mmol/L (ref 22–32)
CREATININE: 0.96 mg/dL (ref 0.44–1.00)
Calcium: 7.6 mg/dL — ABNORMAL LOW (ref 8.9–10.3)
GFR calc Af Amer: >60 mL/min (ref >60)
GLUCOSE: 89 mg/dL (ref 65–99)
POTASSIUM: 3.5 mmol/L (ref 3.5–5.1)
Sodium: 136 mmol/L (ref 135–145)
TOTAL PROTEIN: 5.6 g/dL — AB (ref 6.5–8.1)

## 2016-06-29 LAB — TYPE AND SCREEN
ABO/RH(D): O POS
Antibody Screen: NEGATIVE

## 2016-06-29 LAB — URINE MICROSCOPIC-ADD ON

## 2016-06-29 LAB — CULTURE, BETA STREP (GROUP B ONLY)

## 2016-06-29 LAB — CBC
HEMATOCRIT: 32.3 % — AB (ref 36.0–46.0)
Hemoglobin: 11 g/dL — ABNORMAL LOW (ref 12.0–15.0)
MCH: 29 pg (ref 26.0–34.0)
MCHC: 34.1 g/dL (ref 30.0–36.0)
MCV: 85.2 fL (ref 78.0–100.0)
Platelets: 141 10*3/uL — ABNORMAL LOW (ref 150–400)
RBC: 3.79 MIL/uL — AB (ref 3.87–5.11)
RDW: 13.9 % (ref 11.5–15.5)
WBC: 9.9 10*3/uL (ref 4.0–10.5)

## 2016-06-29 MED ORDER — CLONIDINE HCL 0.1 MG PO TABS
0.1000 mg | ORAL_TABLET | Freq: Three times a day (TID) | ORAL | Status: DC
  Administered 2016-06-29 – 2016-07-07 (×21): 0.1 mg via ORAL
  Filled 2016-06-29 (×29): qty 1

## 2016-06-29 MED ORDER — LABETALOL HCL 5 MG/ML IV SOLN
40.0000 mg | INTRAVENOUS | Status: AC
  Administered 2016-06-29: 40 mg via INTRAVENOUS

## 2016-06-29 MED ORDER — HYDRALAZINE HCL 50 MG PO TABS
50.0000 mg | ORAL_TABLET | Freq: Three times a day (TID) | ORAL | Status: DC
  Administered 2016-06-29 – 2016-07-06 (×21): 50 mg via ORAL
  Filled 2016-06-29 (×26): qty 1

## 2016-06-29 MED ORDER — HYDRALAZINE HCL 20 MG/ML IJ SOLN
10.0000 mg | INTRAMUSCULAR | Status: AC
  Administered 2016-06-29: 10 mg via INTRAVENOUS

## 2016-06-29 MED ORDER — LABETALOL HCL 5 MG/ML IV SOLN
80.0000 mg | INTRAVENOUS | Status: AC
  Administered 2016-06-29: 80 mg via INTRAVENOUS
  Filled 2016-06-29: qty 16

## 2016-06-29 MED ORDER — HYDRALAZINE HCL 50 MG PO TABS
50.0000 mg | ORAL_TABLET | Freq: Three times a day (TID) | ORAL | Status: DC
  Filled 2016-06-29 (×3): qty 1

## 2016-06-29 NOTE — Progress Notes (Signed)
Patient ID: Vanessa MelnickLashonda M Radilla, female   DOB: 01/04/1986, 30 y.o.   MRN: [id number] ACULTY PRACTICE ANTEPARTUM COMPREHENSIVE PROGRESS NOTE  Vanessa MelnickLashonda M Vila is a 30 y.o. [id number] at 5825w3d  who is admitted for preeclampsia with severe features  Fetal presentation is cephalic. Length of Stay:  3  Days  Subjective: Pt denies HA at present. No severe range BP's overnight. Patient reports good fetal movement.  She reports no uterine contractions, no bleeding and no loss of fluid per vagina.  Vitals:  Blood pressure 140/84, pulse 77, temperature 98.7 F (37.1 C), temperature source Oral, resp. rate 18, height 5\' 4"  (1.626 m), weight 188 lb (85.3 kg), last menstrual period 12/20/2015, SpO2 99 %. Physical Examination: General appearance - alert, well appearing, and in no distress Chest - clear to auscultation, no wheezes, rales or rhonchi, symmetric air entry Heart - normal rate, regular rhythm, normal S1, S2, no murmurs, rubs, clicks or gallops Abdomen - soft, nontender, nondistended, no masses or organomegaly gravid Cervical Exam: Not evaluated. Extremities: extremities normal, atraumatic, no cyanosis or edema Membranes:intact  Fetal Monitoring:  Baseline: 140's bpm, Variability: Good {> 6 bpm), Accelerations: Non-reactive but appropriate for gestational age, Decelerations: Absent and Toco: no ctx  Labs:  No results found for this or any previous visit (from the past 24 hour(s)).  Imaging Studies:    None pending   Medications:  Scheduled . buPROPion  150 mg Oral BID  . docusate sodium  100 mg Oral Daily  . labetalol  400 mg Oral Q8H  . pantoprazole  40 mg Oral Daily  . prenatal multivitamin  1 tablet Oral Q1200   I have reviewed the patient's current medications.  ASSESSMENT: Patient Active Problem List   Diagnosis Date Noted  . Preeclampsia 06/26/2016  . Pain due to intrauterine contraceptive device (IUD) (HCC) 01/17/2015  . Cannabis dependence 01/28/2012  . Chronic migraine  01/28/2012  . TOBACCO USER 11/06/2009    PLAN: Pt s/p BMZ Watch for s/sx of worsening disease Cont Labetalol at 400mg  tid for now repeat sono in 3 weeks deliver for worsening maternal or fetal staus Continue routine antenatal care.   HARRAWAY-SMITH, Gwynneth Fabio 06/29/2016,6:42 AM

## 2016-06-29 NOTE — Progress Notes (Signed)
Called to evaluate patient for continued increased blood pressures. I had been discussing blood pressure over the phone with patient's nurse and patient had received IV hydralazine 10 mg 2, IV labetalol 40 mg and 80 mg with minimal improvement in her blood pressure. The my evaluation blood pressure is 160/101. Patient complained of a minimal fogginess in her head but no severe headache. She denied blurred vision chest pain or right upper quadrant pain. Decided to check a stat CMP and CBC both which were mostly within normal limits. There had been no elevation in LFTs. Creatinine was 0.96 yesterday was 0.97 and platelets were 141 and stable. At this point we will continue to monitor patient but we will not consider delivery unless blood pressure is unable to be controlled. Next a would consider adding by mouth hydralazine to her regimen.

## 2016-06-30 NOTE — Progress Notes (Signed)
Dr. Jolayne Pantheronstant to room to discuss patient wanting to leave against medical advise. Patient states she wants to go and get some things from home and to take care of getting her children registered for school. Offered social work Physiological scientistconsult and encouragement/support.

## 2016-06-30 NOTE — Progress Notes (Signed)
FACULTY PRACTICE ANTEPARTUM(COMPREHENSIVE) NOTE  Vanessa MelnickLashonda M Wise is a 30 y.o. [id number] at 2618w4d by early ultrasound who is admitted for severe preeclampsia Fetal presentation is cephalic. Length of Stay:  4  Days  Subjective: No current headache Patient reports the fetal movement as active. Patient reports uterine contraction  activity as none. Patient reports  vaginal bleeding as none. Patient describes fluid per vagina as None.  Vitals:  Blood pressure (!) 150/93, pulse 75, temperature 98.5 F (36.9 C), temperature source Axillary, resp. rate 18, height 5\' 4"  (1.626 m), weight 85.3 kg (188 lb), last menstrual period 12/20/2015, SpO2 99 %. Physical Examination:  General appearance - alert, well appearing, and in no distress Heart - normal rate and regular rhythm Abdomen - soft, nontender, nondistended Fundal Height:  size equals dates Cervical Exam: Not evaluated. Extremities: extremities normal, atraumatic, no cyanosis or edema and Homans sign is negative, no sign of DVT Membranes:intact  Fetal Monitoring:     Fetal Heart Rate A  Mode External filed at 06/29/2016 2338  Baseline Rate (A) 135 bpm filed at 06/29/2016 2338  Variability <5 BPM, 6-25 BPM filed at 06/29/2016 2338  Accelerations None filed at 06/29/2016 2338  Decelerations None filed at 06/29/2016 2338  Multiple birth? N filed at 06/26/2016      Labs:  Results for orders placed or performed during the hospital encounter of 06/26/16 (from the past 24 hour(s))  CBC   Collection Time: 06/29/16  3:03 PM  Result Value Ref Range   WBC 9.9 4.0 - 10.5 K/uL   RBC 3.79 (L) 3.87 - 5.11 MIL/uL   Hemoglobin 11.0 (L) 12.0 - 15.0 g/dL   HCT 84.632.3 (L) 96.236.0 - 95.246.0 %   MCV 85.2 78.0 - 100.0 fL   MCH 29.0 26.0 - 34.0 pg   MCHC 34.1 30.0 - 36.0 g/dL   RDW 84.113.9 32.411.5 - 40.115.5 %   Platelets 141 (L) 150 - 400 K/uL  Comprehensive metabolic panel   Collection Time: 06/29/16  3:03 PM  Result Value Ref Range   Sodium 136 135 - 145  mmol/L   Potassium 3.5 3.5 - 5.1 mmol/L   Chloride 109 101 - 111 mmol/L   CO2 22 22 - 32 mmol/L   Glucose, Bld 89 65 - 99 mg/dL   BUN 20 6 - 20 mg/dL   Creatinine, Ser 0.270.96 0.44 - 1.00 mg/dL   Calcium 7.6 (L) 8.9 - 10.3 mg/dL   Total Protein 5.6 (L) 6.5 - 8.1 g/dL   Albumin 2.4 (L) 3.5 - 5.0 g/dL   AST 32 15 - 41 U/L   ALT 28 14 - 54 U/L   Alkaline Phosphatase 78 38 - 126 U/L   Total Bilirubin 0.3 0.3 - 1.2 mg/dL   GFR calc non Af Amer >60 >60 mL/min   GFR calc Af Amer >60 >60 mL/min   Anion gap 5 5 - 15  Type and screen [facility]   Collection Time: 06/29/16  7:37 PM  Result Value Ref Range   ABO/RH(D) O POS    Antibody Screen NEG    Sample Expiration 07/02/2016   Urinalysis, Routine w reflex microscopic (not at [facility])   Collection Time: 06/29/16 11:30 PM  Result Value Ref Range   Color, Urine YELLOW YELLOW   APPearance CLEAR CLEAR   Specific Gravity, Urine 1.025 1.005 - 1.030   pH 6.5 5.0 - 8.0   Glucose, UA NEGATIVE NEGATIVE mg/dL   Hgb urine dipstick TRACE (A) NEGATIVE  Bilirubin Urine NEGATIVE NEGATIVE   Ketones, ur NEGATIVE NEGATIVE mg/dL   Protein, ur >161 (A) NEGATIVE mg/dL   Nitrite NEGATIVE NEGATIVE   Leukocytes, UA NEGATIVE NEGATIVE  Urine microscopic-add on   Collection Time: 06/29/16 11:30 PM  Result Value Ref Range   Squamous Epithelial / LPF 6-30 (A) NONE SEEN   WBC, UA 0-5 0 - 5 WBC/hpf   RBC / HPF 0-5 0 - 5 RBC/hpf   Bacteria, UA MANY (A) NONE SEEN   Casts HYALINE CASTS (A) NEGATIVE   Urine-Other MUCOUS PRESENT     Imaging Studies:      Medications:  Scheduled . buPROPion  150 mg Oral BID  . cloNIDine  0.1 mg Oral TID  . docusate sodium  100 mg Oral Daily  . hydrALAZINE  50 mg Oral Q8H  . labetalol  400 mg Oral Q8H  . pantoprazole  40 mg Oral Daily  . prenatal multivitamin  1 tablet Oral Q1200   I have reviewed the patient's current medications.  ASSESSMENT: Patient Active Problem List   Diagnosis Date Noted  .  Preeclampsia 06/26/2016  . Pain due to intrauterine contraceptive device (IUD) (HCC) 01/17/2015  . Cannabis dependence 01/28/2012  . Chronic migraine 01/28/2012  . TOBACCO USER 11/06/2009    PLAN: Follow BP and symptoms on current medication regimen  Artemisia Auvil 06/30/2016,6:47 AM

## 2016-07-01 LAB — COMPREHENSIVE METABOLIC PANEL
ALBUMIN: 2.3 g/dL — AB (ref 3.5–5.0)
ALT: 51 U/L (ref 14–54)
AST: 41 U/L (ref 15–41)
Alkaline Phosphatase: 75 U/L (ref 38–126)
Anion gap: 4 — ABNORMAL LOW (ref 5–15)
BILIRUBIN TOTAL: 0.2 mg/dL — AB (ref 0.3–1.2)
BUN: 23 mg/dL — AB (ref 6–20)
CHLORIDE: 109 mmol/L (ref 101–111)
CO2: 20 mmol/L — ABNORMAL LOW (ref 22–32)
CREATININE: 1.04 mg/dL — AB (ref 0.44–1.00)
Calcium: 7.5 mg/dL — ABNORMAL LOW (ref 8.9–10.3)
GFR calc Af Amer: >60 mL/min (ref >60)
GLUCOSE: 86 mg/dL (ref 65–99)
POTASSIUM: 3.6 mmol/L (ref 3.5–5.1)
Sodium: 133 mmol/L — ABNORMAL LOW (ref 135–145)
TOTAL PROTEIN: 5.2 g/dL — AB (ref 6.5–8.1)

## 2016-07-01 LAB — CBC
HEMATOCRIT: 31.5 % — AB (ref 36.0–46.0)
Hemoglobin: 10.8 g/dL — ABNORMAL LOW (ref 12.0–15.0)
MCH: 29 pg (ref 26.0–34.0)
MCHC: 34.3 g/dL (ref 30.0–36.0)
MCV: 84.5 fL (ref 78.0–100.0)
PLATELETS: 128 10*3/uL — AB (ref 150–400)
RBC: 3.73 MIL/uL — ABNORMAL LOW (ref 3.87–5.11)
RDW: 13.7 % (ref 11.5–15.5)
WBC: 9.7 10*3/uL (ref 4.0–10.5)

## 2016-07-01 MED ORDER — LABETALOL HCL 300 MG PO TABS
600.0000 mg | ORAL_TABLET | Freq: Three times a day (TID) | ORAL | Status: DC
  Administered 2016-07-01 – 2016-07-02 (×3): 600 mg via ORAL
  Filled 2016-07-01 (×3): qty 2

## 2016-07-01 MED ORDER — LABETALOL HCL 100 MG PO TABS
200.0000 mg | ORAL_TABLET | Freq: Once | ORAL | Status: AC
  Administered 2016-07-01: 200 mg via ORAL
  Filled 2016-07-01: qty 2

## 2016-07-01 NOTE — Clinical SW OB High Risk (Signed)
Clinical Social Work Antenatal   Clinical Social Worker:  Barbara CowerANGEL D BOYD-GILYARD, LCSW Date/Time:  07/01/2016, 10:44 AM Gestational Age on Admission:  30 y.o. Admitting Diagnosis:  preeclampsia with severe range BPs on arrival   Expected Delivery Date:  09/26/16  Family/Home Environment  [place]  [address] [place] [address]  Household Member/Support Name:  Molly MaduroRobert DeVaughn Relationship:   Boyfriend Other Support:  None   Psychosocial Data  Information Source:  Family Interview Resources:  Foodstamps, [employer], [employer]   Employment: Unemployed   Medicaid [facility]):  [employer]:  N/A   Current Grade:  N/A  Homebound Arranged: No  Other Resources:  Medicaid, [employer] account [employer] Stamps  Cultural/Environment Issues Impacting Care:  Patient's report poor living conditions of patient's apartment complex.   Strengths/Weaknesses/Factors to Consider  Concerns Related to Hospitalization:  Patient is concerned about housing and schooling for her 2 children (ages 768 and 4211).  Patient expressed a need to leave hospital to have school aged children enrolled in a different school and to meet with housing manager to request a transfer to a different housing complex.  Patient communicated to CSW patient experienced stress and feelings of being overwhelmed with her children's school.  Patient has been in contact with [employer] [employer] [facility] re-assignment coordinator, Emelda Fearoyle Craven.  CSW validated patient's feelings and offered to provide patient with a letter verifying hospitalization to [employer] [facility] [employer] and to [employer] [employer].  Patient thanked CSW and informed CSW that patient will reach out if CSW service is needed.    Previous Pregnancies/Feelings Towards Pregnancy?  Concerns related to being/becoming a mother?:  Patient expressed patient wants to do everything to ensure patient has a healthy pregnancy and delivery.   Social Support (FOB? Who is/will be helping  with baby/other kids?): Yes, FOB was engaged and agreed to assist patient with re-enrolling children and school and assisting with moving patient and family to another housing complex.  Couples Relationship (describe): significant others   Recent Stressful Life Events (life changes in past year?):  None reported  Prenatal Care/Education/Home Preparations: Patient reports having high blood pressure and has been consistent with prenatal care.    Domestic Violence (of any type):  No If Yes to Domestic Violence, Describe/Action Plan:  N/A   Substance Use During Pregnancy: No (If Yes, Complete SBIRT)  Complete PHQ-9 (Depresssion Screening) on all Antenatal Patients PHQ-9 Score (If Score => 15 complete TREAT):     Follow-up Recommendations:  Patient will follow-up with CSW is services are needed.   Patient Advised/Response:   Patient was understanding of patient's medical need and was accepting of CSW help.  Patient agreed to continue hospitalization until patient in medically cleared for dc.    Other:      Clinical Assessment/Plan:   CSW will follow-up with patient in the near future to re-assess patient's needs.    Blaine HamperAngel Boyd-Gilyard, MSW, LCSW Clinical Social Work [phone]

## 2016-07-01 NOTE — Progress Notes (Signed)
Patient ID: Vanessa Wise, female   DOB: 06/06/1986, 30 y.o.   MRN: [id number] FACULTY PRACTICE ANTEPARTUM(COMPREHENSIVE) NOTE  Vanessa Wise is a 30 y.o. [id number] at 2463w5d by early ultrasound who is admitted for severe preeclampsia Fetal presentation is cephalic. Length of Stay:  5  Days  Subjective: Patient reports a headache this morning and also asked if delivery was eminent. Patient states that she has a lot to care of outside of the hospital. Her 2 children are still in need of school placement ([place] In addition, her apartment is condemned and she needs to find a new place to stay. She strongly desires to leave the hospital Patient reports the fetal movement as active. Patient reports uterine contraction  activity as none. Patient reports  vaginal bleeding as none. Patient describes fluid per vagina as None.  Vitals:  Blood pressure (!) 154/88, pulse 79, temperature 98.3 F (36.8 C), temperature source Oral, resp. rate 18, height 5\' 4"  (1.626 m), weight 188 lb (85.3 kg), last menstrual period 12/20/2015, SpO2 99 %. Physical Examination:  General appearance - alert, well appearing, and in no distress Heart - normal rate and regular rhythm Abdomen - soft, nontender, nondistended Fundal Height:  size equals dates Cervical Exam: Not evaluated. Extremities: extremities normal, atraumatic, no cyanosis or edema and Homans sign is negative, no sign of DVT Membranes:intact  Fetal Monitoring:     Baseline 120, mod variability, + 10 x 10 accels, no decels Toco: no contractions   Labs:  No results found for this or any previous visit (from the past 24 hour(s)).  Imaging Studies:      Medications:  Scheduled . buPROPion  150 mg Oral BID  . cloNIDine  0.1 mg Oral TID  . docusate sodium  100 mg Oral Daily  . hydrALAZINE  50 mg Oral Q8H  . labetalol  600 mg Oral Q8H  . pantoprazole  40 mg Oral Daily  . prenatal multivitamin   1 tablet Oral Q1200   I have reviewed the patient's current medications.  ASSESSMENT: Patient Active Problem List   Diagnosis Date Noted  . Preeclampsia 06/26/2016  . Pain due to intrauterine contraceptive device (IUD) (HCC) 01/17/2015  . Cannabis dependence 01/28/2012  . Chronic migraine 01/28/2012  . TOBACCO USER 11/06/2009    PLAN: 1) Severe preeclampsia - Labetalol increased to 600 mg TID. - Will treat headache - Continue monitoring BP - Patient is willing to stay until social work consult but understands that if she will be leaving against medical advise - Continue current antepartum care - Consider glucola at a later date in view of recent BMZ   Vanessa Wise 07/01/2016,7:24 AM

## 2016-07-02 ENCOUNTER — Inpatient Hospital Stay (HOSPITAL_COMMUNITY): Payer: Medicaid Other

## 2016-07-02 ENCOUNTER — Encounter (HOSPITAL_COMMUNITY): Payer: Self-pay | Admitting: Obstetrics and Gynecology

## 2016-07-02 DIAGNOSIS — R0602 Shortness of breath: Secondary | ICD-10-CM

## 2016-07-02 DIAGNOSIS — Z3A27 27 weeks gestation of pregnancy: Secondary | ICD-10-CM

## 2016-07-02 DIAGNOSIS — O1493 Unspecified pre-eclampsia, third trimester: Secondary | ICD-10-CM | POA: Diagnosis not present

## 2016-07-02 LAB — COMPREHENSIVE METABOLIC PANEL
ALBUMIN: 2.2 g/dL — AB (ref 3.5–5.0)
ALT: 36 U/L (ref 14–54)
ANION GAP: 4 — AB (ref 5–15)
AST: 29 U/L (ref 15–41)
Alkaline Phosphatase: 78 U/L (ref 38–126)
BUN: 21 mg/dL — AB (ref 6–20)
CHLORIDE: 109 mmol/L (ref 101–111)
CO2: 21 mmol/L — ABNORMAL LOW (ref 22–32)
Calcium: 7.6 mg/dL — ABNORMAL LOW (ref 8.9–10.3)
Creatinine, Ser: 1.07 mg/dL — ABNORMAL HIGH (ref 0.44–1.00)
GFR calc Af Amer: >60 mL/min (ref >60)
GFR calc non Af Amer: >60 mL/min (ref >60)
GLUCOSE: 87 mg/dL (ref 65–99)
POTASSIUM: 4 mmol/L (ref 3.5–5.1)
SODIUM: 134 mmol/L — AB (ref 135–145)
Total Bilirubin: 0.4 mg/dL (ref 0.3–1.2)
Total Protein: 5 g/dL — ABNORMAL LOW (ref 6.5–8.1)

## 2016-07-02 LAB — CBC
HEMATOCRIT: 31.9 % — AB (ref 36.0–46.0)
HEMOGLOBIN: 11 g/dL — AB (ref 12.0–15.0)
MCH: 29.2 pg (ref 26.0–34.0)
MCHC: 34.5 g/dL (ref 30.0–36.0)
MCV: 84.6 fL (ref 78.0–100.0)
Platelets: 143 10*3/uL — ABNORMAL LOW (ref 150–400)
RBC: 3.77 MIL/uL — ABNORMAL LOW (ref 3.87–5.11)
RDW: 13.6 % (ref 11.5–15.5)
WBC: 9.3 10*3/uL (ref 4.0–10.5)

## 2016-07-02 MED ORDER — LABETALOL HCL 300 MG PO TABS: 800.0000 mg | ORAL_TABLET | Freq: Three times a day (TID) | ORAL | Status: DC

## 2016-07-02 MED ORDER — LABETALOL HCL 300 MG PO TABS
600.0000 mg | ORAL_TABLET | Freq: Three times a day (TID) | ORAL | Status: DC
  Administered 2016-07-02 – 2016-07-03 (×4): 600 mg via ORAL
  Filled 2016-07-02 (×4): qty 2

## 2016-07-02 MED ORDER — FUROSEMIDE 10 MG/ML IJ SOLN
20.0000 mg | Freq: Once | INTRAMUSCULAR | Status: AC
  Administered 2016-07-02: 20 mg via INTRAVENOUS
  Filled 2016-07-02: qty 2

## 2016-07-02 NOTE — Progress Notes (Signed)
Pt with N&V, Zofran offered to pt and pt refused.  Will continue to closely monitor.   Estanislado EmmsAshley Schwarz, RN BSN

## 2016-07-02 NOTE — Progress Notes (Signed)
OB Note  CTSP for concern for SROM. Pt had some LOF, mucus discharge when she woke up but none since and she has been to the bathroom a few times today and nothing on pad and no discharge. No decreased FM or PTL s/s and no s/s of pre-x including no SOB  Patient Vitals for the past 24 hrs:  BP Temp Temp src Pulse Resp  07/02/16 1405 (!) 142/88 - - - -  07/02/16 1300 (!) 148/87 98.8 F (37.1 C) Oral 75 -  07/02/16 1038 (!) 142/88 - - 80 18  07/02/16 0747 (!) 141/87 98.7 F (37.1 C) - 86 18  07/02/16 0607 (!) 160/91 - - 80 20  07/01/16 2316 - - - - 20  07/01/16 2130 (!) 162/105 - - 69 20  07/01/16 1920 (!) 160/98 - - 75 20  07/01/16 1912 (!) 161/99 - - 75 -  07/01/16 1902 (!) 162/107 - - 72 18  07/01/16 1852 (!) 166/100 97.7 F (36.5 C) - 67 -  07/01/16 1841 (!) 174/108 - - 71 -  07/01/16 1825 (!) 176/107 - - 71 20  07/01/16 1627 (!) 165/104 - - 76 18   NAD CTAB Normal s1 and s2, no MRGs Gravid, nttp 1+brachial b/l EGBUS normal SSE: minimal white d/c in vault, negative cough test, cx visually closed  A/P: negative SROM evaluation Pre-x precautions again emphasized Continue with regular AP testing.  Cornelia Copaharlie Chayla Shands, Jr MD Attending Center for [facility](Faculty Practice)

## 2016-07-02 NOTE — Progress Notes (Signed)
Patient ID: Vanessa Wise, female   DOB: 09/19/1986, 30 y.o.   MRN: [id number] FACULTY PRACTICE ANTEPARTUM(COMPREHENSIVE) NOTE  Vanessa Wise is a 30 y.o. [id number] at [redacted]w[redacted]d by early ultrasound who is admitted for severe preeclampsia Fetal presentation is cephalic. Length of Stay:  6  Days  Subjective: Patient reports difficulty breathing.  States that she doesn't want to fall asleep because she is afraid that she won't get up.   Patient reports the fetal movement as active. Patient reports uterine contraction  activity as none. Patient reports  vaginal bleeding as none. Patient describes fluid per vagina as None.  Vitals:  Blood pressure (!) 141/87, pulse 86, temperature 98.7 F (37.1 C), resp. rate 18, height 5\' 4"  (1.626 m), weight 188 lb (85.3 kg), last menstrual period 12/20/2015, SpO2 99 %. Physical Examination:  General appearance - alert, well appearing, and in no distress Heart - normal rate and regular rhythm Lungs - CTA Abdomen - soft, nontender, nondistended Fundal Height:  size equals dates Cervical Exam: Not evaluated. Extremities: extremities normal, atraumatic, no cyanosis or edema and Homans sign is negative, no sign of DVT Membranes:intact  Fetal Monitoring:     Baseline 120, mod variability, + 10 x 10 accels, no decels  x2 Toco: no contractions   Labs:  Results for orders placed or performed during the hospital encounter of 06/26/16 (from the past 24 hour(s))  Comprehensive metabolic panel   Collection Time: 07/01/16  9:47 AM  Result Value Ref Range   Sodium 133 (L) 135 - 145 mmol/L   Potassium 3.6 3.5 - 5.1 mmol/L   Chloride 109 101 - 111 mmol/L   CO2 20 (L) 22 - 32 mmol/L   Glucose, Bld 86 65 - 99 mg/dL   BUN 23 (H) 6 - 20 mg/dL   Creatinine, Ser 1.47 (H) 0.44 - 1.00 mg/dL   Calcium 7.5 (L) 8.9 - 10.3 mg/dL   Total Protein 5.2 (L) 6.5 - 8.1 g/dL   Albumin 2.3 (L) 3.5 - 5.0 g/dL   AST 41 15 - 41 U/L   ALT 51 14 - 54 U/L   Alkaline Phosphatase 75  38 - 126 U/L   Total Bilirubin 0.2 (L) 0.3 - 1.2 mg/dL   GFR calc non Af Amer >60 >60 mL/min   GFR calc Af Amer >60 >60 mL/min   Anion gap 4 (L) 5 - 15  CBC   Collection Time: 07/01/16  9:47 AM  Result Value Ref Range   WBC 9.7 4.0 - 10.5 K/uL   RBC 3.73 (L) 3.87 - 5.11 MIL/uL   Hemoglobin 10.8 (L) 12.0 - 15.0 g/dL   HCT 82.9 (L) 56.2 - 13.0 %   MCV 84.5 78.0 - 100.0 fL   MCH 29.0 26.0 - 34.0 pg   MCHC 34.3 30.0 - 36.0 g/dL   RDW 86.5 78.4 - 69.6 %   Platelets 128 (L) 150 - 400 K/uL    Imaging Studies:      Medications:  Scheduled . buPROPion  150 mg Oral BID  . cloNIDine  0.1 mg Oral TID  . docusate sodium  100 mg Oral Daily  . furosemide  20 mg Intravenous Once  . hydrALAZINE  50 mg Oral Q8H  . labetalol  800 mg Oral Q8H  . pantoprazole  40 mg Oral Daily  . prenatal multivitamin  1 tablet Oral Q1200   I have reviewed the patient's current medications.  ASSESSMENT: Patient Active Problem List   Diagnosis Date Noted  .  Preeclampsia 06/26/2016  . Pain due to intrauterine contraceptive device (IUD) (HCC) 01/17/2015  . Cannabis dependence 01/28/2012  . Chronic migraine 01/28/2012  . TOBACCO USER 11/06/2009    PLAN: 1) Severe preeclampsia - Labetalol at 600 mg TID. - Clonidine 0.1 mg TID - Continue monitoring BP - Patient is willing to stay until social work consult but understands that if she will be leaving against medical advise - Continue current antepartum care - Consider glucola at a later date in view of recent BMZ  2) SOB - CXR - Lasix 20mg  IV - Pt very anxious - may be anxiety related.   Rhona RaiderJacob J Giovannina Mun, DO 07/02/2016,7:52 AM

## 2016-07-03 LAB — COMPREHENSIVE METABOLIC PANEL
ALBUMIN: 2.1 g/dL — AB (ref 3.5–5.0)
ALK PHOS: 73 U/L (ref 38–126)
ALT: 29 U/L (ref 14–54)
AST: 27 U/L (ref 15–41)
Anion gap: 6 (ref 5–15)
BILIRUBIN TOTAL: 0.4 mg/dL (ref 0.3–1.2)
BUN: 22 mg/dL — AB (ref 6–20)
CALCIUM: 7.6 mg/dL — AB (ref 8.9–10.3)
CO2: 20 mmol/L — AB (ref 22–32)
Chloride: 106 mmol/L (ref 101–111)
Creatinine, Ser: 1.03 mg/dL — ABNORMAL HIGH (ref 0.44–1.00)
GFR calc Af Amer: >60 mL/min (ref >60)
GFR calc non Af Amer: >60 mL/min (ref >60)
GLUCOSE: 78 mg/dL (ref 65–99)
Potassium: 3.8 mmol/L (ref 3.5–5.1)
Sodium: 132 mmol/L — ABNORMAL LOW (ref 135–145)
TOTAL PROTEIN: 4.9 g/dL — AB (ref 6.5–8.1)

## 2016-07-03 LAB — TYPE AND SCREEN
ABO/RH(D): O POS
ABO/RH(D): O POS
Antibody Screen: NEGATIVE
Antibody Screen: NEGATIVE

## 2016-07-03 LAB — CBC
HEMATOCRIT: 30.5 % — AB (ref 36.0–46.0)
HEMOGLOBIN: 10.7 g/dL — AB (ref 12.0–15.0)
MCH: 29.6 pg (ref 26.0–34.0)
MCHC: 35.1 g/dL (ref 30.0–36.0)
MCV: 84.5 fL (ref 78.0–100.0)
Platelets: 143 10*3/uL — ABNORMAL LOW (ref 150–400)
RBC: 3.61 MIL/uL — AB (ref 3.87–5.11)
RDW: 13.6 % (ref 11.5–15.5)
WBC: 8.8 10*3/uL (ref 4.0–10.5)

## 2016-07-03 MED ORDER — SODIUM CHLORIDE 0.9% FLUSH
3.0000 mL | Freq: Two times a day (BID) | INTRAVENOUS | Status: DC
  Administered 2016-07-03 – 2016-07-08 (×4): 3 mL via INTRAVENOUS

## 2016-07-03 MED ORDER — LABETALOL HCL 200 MG PO TABS
800.0000 mg | ORAL_TABLET | Freq: Three times a day (TID) | ORAL | Status: DC
  Administered 2016-07-03 – 2016-07-07 (×11): 800 mg via ORAL
  Filled 2016-07-03: qty 4
  Filled 2016-07-03: qty 2
  Filled 2016-07-03: qty 4
  Filled 2016-07-03 (×2): qty 1
  Filled 2016-07-03 (×2): qty 4
  Filled 2016-07-03: qty 2
  Filled 2016-07-03: qty 1
  Filled 2016-07-03 (×2): qty 4
  Filled 2016-07-03: qty 2
  Filled 2016-07-03 (×2): qty 4

## 2016-07-03 NOTE — Progress Notes (Signed)
Initial Nutrition Assessment  DOCUMENTATION CODES:  n/a   INTERVENTION:  Regular Diet May order double protein portions, snacks TID and from retail  NUTRITION DIAGNOSIS:   Increased nutrient needs related to  (pregnancy and fetal growth requirments) as evidenced by  (28 weeks IUP). GOAL:  Pt will meet 90 % of estimated needs   MONITOR:   weight trend  REASON FOR ASSESSMENT:  Antenatal   ASSESSMENT:   28 weeks, preeclampsia. weight at 4 weeks 150 lbs, BMI 25.8. 38 lb weight gain. Appetite fair. Offered snacks TID to improve intake  Diet Order:  Diet regular Room service appropriate? Yes; Fluid consistency: Thin  Skin:  Reviewed, no issues  Height:   Ht Readings from Last 1 Encounters:  06/26/16 5\' 4"  (1.626 m)   Weight:   Wt Readings from Last 1 Encounters:  07/03/16 200 lb 3.2 oz (90.8 kg)   Ideal Body Weight:   120 lbs BMI:  Body mass index is 34.36 kg/m.  Estimated Nutritional Needs:   Kcal:  1900-2100  Protein:  85-95 g  Fluid:  2.2L  EDUCATION NEEDS: no education needs identified   News CorporationKatherine Kourtnie Sachs M.Odis LusterEd. R.D. LDN Neonatal Nutrition Support Specialist/RD III Pager [phone]      Phone [phone]

## 2016-07-03 NOTE — Progress Notes (Addendum)
Daily Antepartum Note  Admission Date: 06/26/2016 Current Date: 07/03/2016 8:02 AM  Marga MelnickLashonda M Blatchford is a 30 y.o. [id number] @ 1092w0d by, HD#8, admitted for pre-eclampsia with severe features.  Pregnancy complicated by: Difficult social situation, h/o THC use, h/o chornic migraines Overnight/24hr events:  None  Subjective:  No s/s of PTL or decreased FM. States she has some HA or visual changes when she gets anxious and has some SOB but nothing consistent or s/s of pre-eclampsia.   Objective:    Current Vital Signs 24h Vital Sign Ranges  T 98.2 F (36.8 C) Temp  Avg: 98.2 F (36.8 C)  Min: 97.8 F (36.6 C)  Max: 98.8 F (37.1 C)  BP (!) 163/92 BP  Min: 142/88  Max: 163/92  HR 78 Pulse  Avg: 78  Min: 75  Max: 84  RR 18 Resp  Avg: 17.6  Min: 16  Max: 18  SaO2 99 % Not Delivered No Data Recorded       24 Hour I/O Current Shift I/O  Time Ins Outs No intake/output data recorded. No intake/output data recorded.   Patient Vitals for the past 24 hrs:  BP Temp Temp src Pulse Resp  07/03/16 0614 (!) 163/92 - - 78 -  07/03/16 0611 (!) 147/124 98.2 F (36.8 C) Oral 77 18  07/03/16 0008 (!) 156/86 98.1 F (36.7 C) Oral 75 16  07/02/16 2011 (!) 155/85 97.8 F (36.6 C) Oral 77 18  07/02/16 1553 (!) 155/95 - - 84 18  07/02/16 1405 (!) 142/88 - - - -  07/02/16 1300 (!) 148/87 98.8 F (37.1 C) Oral 75 -  07/02/16 1038 (!) 142/88 - - 80 18    Physical exam: General: Well nourished, well developed female in no acute distress. Abdomen: gravid, nttp Cardiovascular: S1, S2 normal, no murmur, rub or gallop, regular rate and rhythm Respiratory: CTAB Extremities: no clubbing, cyanosis or edema Skin: Warm and dry.   Medications: Current Facility-Administered Medications  Medication Dose Route Frequency Provider Last Rate Last Dose  . acetaminophen (TYLENOL) tablet 650 mg  650 mg Oral Q4H PRN Hurshel PartyLisa A Leftwich-Kirby, CNM   650 mg at 07/01/16 0816  . buPROPion [facility](WELLBUTRIN SR) 12 hr tablet  150 mg  150 mg Oral BID Hurshel PartyLisa A Leftwich-Kirby, CNM   150 mg at 07/02/16 2148  . calcium carbonate (TUMS - dosed in mg elemental calcium) chewable tablet 400 mg of elemental calcium  2 tablet Oral Q4H PRN Wilmer FloorLisa A Leftwich-Kirby, CNM   400 mg of elemental calcium at 06/28/16 1718  . cloNIDine (CATAPRES) tablet 0.1 mg  0.1 mg Oral TID Lorne SkeensNicholas Michael Schenk, MD   0.1 mg at 07/02/16 2148  . docusate sodium (COLACE) capsule 100 mg  100 mg Oral Daily Hurshel PartyLisa A Leftwich-Kirby, CNM      . hydrALAZINE (APRESOLINE) injection 10 mg  10 mg Intravenous Q3H PRN Lorne SkeensNicholas Michael Schenk, MD   10 mg at 07/01/16 1903  . hydrALAZINE (APRESOLINE) tablet 50 mg  50 mg Oral Q8H Dorathy KinsmanVirginia Smith, CNM   50 mg at 07/03/16 [id number]  . labetalol (NORMODYNE) tablet 600 mg  600 mg Oral Q8H Rhona RaiderJacob J Stinson, DO   600 mg at 07/03/16 [id number]  . labetalol (NORMODYNE,TRANDATE) injection 20-40 mg  20-40 mg Intravenous Q10 min PRN Hurshel PartyLisa A Leftwich-Kirby, CNM      . ondansetron (ZOFRAN) tablet 4 mg  4 mg Oral Q8H PRN Hurshel PartyLisa A Leftwich-Kirby, CNM      . pantoprazole (PROTONIX) EC tablet 40  mg  40 mg Oral Daily Lisa A Leftwich-Kirby, CNM   40 mg at 06/29/16 1135  . prenatal multivitamin tablet 1 tablet  1 tablet Oral Q1200 Hurshel Party, CNM   1 tablet at 07/01/16 1424  . promethazine (PHENERGAN) injection 25 mg  25 mg Intravenous Q6H PRN Lazaro Arms, MD   25 mg at 06/27/16 0752  . sodium chloride flush (NS) 0.9 % injection 3 mL  3 mL Intravenous Q12H Peggy Constant, MD      . zolpidem (AMBIEN) tablet 5 mg  5 mg Oral QHS PRN Hurshel Party, CNM        Labs:   Recent Labs Lab 07/01/16 [phone] 07/02/16 0849 07/03/16 0611  WBC 9.7 9.3 8.8  HGB 10.8* 11.0* 10.7*  HCT 31.5* 31.9* 30.5*  PLT 128* 143* 143*     Recent Labs Lab 07/01/16 0947 07/02/16 0849 07/03/16 0611  NA 133* 134* 132*  K 3.6 4.0 3.8  CL 109 109 106  CO2 20* 21* 20*  BUN 23* 21* 22*  CREATININE 1.04* 1.07* 1.03*  CALCIUM 7.5* 7.6* 7.6*  PROT 5.2* 5.0*  4.9*  BILITOT 0.2* 0.4 0.4  ALKPHOS 75 78 73  ALT 51 36 29  AST 41 29 27  GLUCOSE 86 87 78   Radiology: no new imaging 8/24: cephalic, normal AFI, EFW 42% 960AV, normal AC and anatomy  Assessment & Plan:  Pt stable *IUP: NST appropriate for GA yesterday. Continue with bid NSTs. *Severe pre-x: stable labs today and BPs usually creep up in the AM before her AM doses of meds; will continue to monitor. Can space out labs to every other day since stable. Precautions given to patient. Will rpt AFI/BPP tomorrow.  *Preterm: s/p BMZ on 8/23 and 8/24. NICU aware *Social: seen by SW. Stressed to patient importance of staying inpatient *PPx: SCDs, OOB ad lib *Dispo: 34wks or any s/s of worsening pre-eclampsia  Cornelia Copa. MD Attending Center for [facility])

## 2016-07-03 NOTE — Progress Notes (Signed)
Faculty Practice OB/GYN Attending Note  Subjective:  Called to evaluate patient who is demanding to be delivered now.  "I am tired of this, you need to get the baby out now".  Denies any current symptoms.  Just wants to be delivered.  FHR reassuring, no contractions, no LOF or vaginal bleeding. Good FM.   Admitted on 06/26/2016 for Preeclampsia.   Objective:  Blood pressure (!) 151/100, pulse 72, temperature 98 F (36.7 C), temperature source Oral, resp. rate 17, height 5\' 4"  (1.626 m), weight 200 lb 3.2 oz (90.8 kg), last menstrual period 12/20/2015, SpO2 99 %. FHT  Baseline 126 bpm, moderate variability, +accelerations, no decelerations Toco: none Gen: NAD HENT: Normocephalic, atraumatic Lungs: Normal respiratory effort Heart: Regular rate noted Abdomen: NT gravid fundus, soft Cervix: Deferred Ext: 2+ DTRs, no edema, no cyanosis, negative Homan's sign  Assessment & Plan:  30 y.o. [id number] at 3631w0d admitted for severe preeclampsia, being expectantly managed on three BP medications. No emergent indication for delivery. This was explained to the patient. She started crying and saying we "don't care about her feelings".  She was told that there is increased morbidity with delivering a 28 week infant; delivery will only be done if absolutely indicated.  She threatened to leave AMA, but was advised of risks of worsening preeclampsia which could have severe consequences for her or the infant including stroke, seizures, death for her or the infant.    Encounter was done with  Kallie LocksMeghan S Garrison, RN  (patient's current nurse) present for the entire conversation  Will continue close observation and move towards delivery if indicated. Continue current antihypertensive regimen.   Jaynie CollinsUGONNA  Elysabeth Aust, MD, FACOG Attending Obstetrician & Gynecologist Faculty Practice, [facility]

## 2016-07-03 NOTE — Progress Notes (Signed)
Per previous RN, pt refused NST in the afternoon.  Pt agreed to do NST tonight.

## 2016-07-04 ENCOUNTER — Inpatient Hospital Stay (HOSPITAL_COMMUNITY): Payer: Medicaid Other

## 2016-07-04 MED ORDER — ENOXAPARIN SODIUM 30 MG/0.3ML ~~LOC~~ SOLN
30.0000 mg | Freq: Once | SUBCUTANEOUS | Status: DC
  Filled 2016-07-04: qty 0.3

## 2016-07-04 NOTE — Progress Notes (Signed)
House Coverage, Rockwell AutomationLeanne Wise, in room at 1530. Dr. Alysia PennaErvin in room at 1542.  Patient cordial with both, and continues to request new nurse. House Coverage working on a transfer to a lower level of care, since patient refuses fetal monitoring and high-risk care.  [address]Vanessa Mukherjee [name], [place] 07/04/2016 [phone]

## 2016-07-04 NOTE — Progress Notes (Signed)
Patient refuses all AM Medications, as well as AM NST. RN will reassess the possibility for an NST at a later time. Patient extremely agitated with staff and refuses Nurse Tech presence in room. RN continues to assess and monitor patient as needed.   [address]Talissa Apple [place] Krystena Reitter, [place]RN 07/04/2016 [phone]

## 2016-07-04 NOTE — Progress Notes (Addendum)
Called to evaluate c/o LE pain/edema.  Pt says she "always since I've been pregnant" have LLE pain along calf; "tonight it's worse."  States that she also has RLE pain. Both LE w/3=4+ edema; LLE + Homan's; however, it's unclear whether just moving the leg in general vs flexion causes pain because she states "it hurts all the time.".  SCD's have not been on.  Pt refused to wear them on antenatal unit.  States that she "has not been offered them.".  Discussed possibility of DVT w/bed rest and pregnancy.  Venous doppler ordered.  Will not place SCD's until DVT has been ruled out.    10:57 PM US not available to doppler 7p-7a.  Will give Lovenox 30mg  now and doppler in am.

## 2016-07-04 NOTE — Progress Notes (Signed)
House coverage notified of situation, will report to room asap. Jovanni Rash [place] Madoc Holquin, [place]RN 07/04/2016 3:18 PM

## 2016-07-04 NOTE — Progress Notes (Signed)
OB Attending. Pt refusing medication and does not like current nurse. Discussed with the pt importance of taking medication. Pt states she will take medication. Will also change care nurses for the pt as well.

## 2016-07-04 NOTE — Progress Notes (Signed)
Patient ID: Vanessa Wise, female   DOB: 12/17/1985, 30 y.o.   MRN: [id number] FACULTY PRACTICE ANTEPARTUM(COMPREHENSIVE) NOTE  Vanessa Wise is a 30 y.o. [id number] at 8257w1d by early ultrasound who is admitted for severe preeclampsia Fetal presentation is cephalic. Length of Stay:  8  Days  Subjective: Patient reports feeling well this morning. She states she has an intermittent headache. She denies visual disturbances, RUQ/epigastric pain, nausea or emesie Patient reports the fetal movement as active. Patient reports uterine contraction  activity as none. Patient reports  vaginal bleeding as none. Patient describes fluid per vagina as None.  Vitals:  Blood pressure (!) 159/98, pulse 74, temperature 98.1 F (36.7 C), temperature source Oral, resp. rate 16, height 5\' 4"  (1.626 m), weight 200 lb 3.2 oz (90.8 kg), last menstrual period 12/20/2015, SpO2 98 %. Physical Examination:  General appearance - alert, well appearing, and in no distress Heart - normal rate and regular rhythm Lungs - CTA Abdomen - soft, nontender, nondistended Fundal Height:  size equals dates Cervical Exam: Not evaluated. Extremities: extremities normal, atraumatic, no cyanosis or edema and Homans sign is negative, no sign of DVT Membranes:intact  Fetal Monitoring:     Baseline 135, mod variability, + 10 x 10 accels, no decels   Toco: no contractions   Labs:  Results for orders placed or performed during the hospital encounter of 06/26/16 (from the past 24 hour(s))  Type and screen [facility]   Collection Time: 07/03/16  9:52 AM  Result Value Ref Range   ABO/RH(D) O POS    Antibody Screen NEG    Sample Expiration 07/06/2016     Imaging Studies:      Medications:  Scheduled . buPROPion  150 mg Oral BID  . cloNIDine  0.1 mg Oral TID  . docusate sodium  100 mg Oral Daily  . hydrALAZINE  50 mg Oral Q8H  . labetalol  800 mg Oral Q8H  . pantoprazole  40 mg Oral Daily  . prenatal  multivitamin  1 tablet Oral Q1200  . sodium chloride flush  3 mL Intravenous Q12H   I have reviewed the patient's current medications.  ASSESSMENT: Patient Active Problem List   Diagnosis Date Noted  . Preeclampsia 06/26/2016  . Cannabis dependence 01/28/2012  . Chronic migraine 01/28/2012  . TOBACCO USER 11/06/2009    PLAN: 1) Severe preeclampsia - Labetalol now increased to 800 mg TID. - Clonidine 0.1 mg TID - Hydralazine 50 TID - Continue monitoring BP - Continue current antepartum care - I had a long conversation with the patient in the presence of her mother, her nurse Steward DroneBrenda and the evening charge nurse around 8 pm yesterday evening. Patient is very frustrated with her hospital stay. She has a lot of social issues that require her attention outside of the hospital. She desired to know her delivery date. I explained to the patient that her case is on a day to day evaluation. It was also stressed that we are trying to delay delivery of premature infant. Patient and her mother initially requested immediate delivery but understood the risks associated with her current situation. Patient is willing to stay for continued inpatient observation   Catalina AntiguaPeggy Robbye Dede, MD 07/04/2016,6:12 AM

## 2016-07-04 NOTE — Progress Notes (Signed)
Pt BP 179/115. Pt administered oral BP medication. Took reluctantly. RN attempted to administer IV Labetolol but patient adamantly refused. RN attempted to educate patient regarding the risks for not taking IV Labetolol but patient became irate, telling RN to "leave me alone" and "don't argue with me!!" Patient finally attempted to fire RN, saying "I don't trust you! I don't want you touching me!"  RN calmly attempted explaining to patient the risks for seizure, but patient would not stop yelling to hear RN.  Patient began yelling about patient rights and RN agreed that as a patient, she does have the right to refuse medications. However, in this case, she is putting herself at great risk by refusing this medication. Dr Alysia PennaErvin called and will round in patient's room shortly.  Brynnleigh Mcelwee [place] Angelette Ganus, [place]RN 07/04/2016

## 2016-07-05 ENCOUNTER — Inpatient Hospital Stay (HOSPITAL_COMMUNITY): Payer: Medicaid Other

## 2016-07-05 ENCOUNTER — Encounter (HOSPITAL_COMMUNITY): Payer: Self-pay | Admitting: Obstetrics & Gynecology

## 2016-07-05 DIAGNOSIS — M79609 Pain in unspecified limb: Secondary | ICD-10-CM

## 2016-07-05 DIAGNOSIS — R609 Edema, unspecified: Secondary | ICD-10-CM

## 2016-07-05 DIAGNOSIS — Z3A28 28 weeks gestation of pregnancy: Secondary | ICD-10-CM

## 2016-07-05 LAB — CBC
HCT: 31.2 % — ABNORMAL LOW (ref 36.0–46.0)
Hemoglobin: 10.7 g/dL — ABNORMAL LOW (ref 12.0–15.0)
MCH: 28.9 pg (ref 26.0–34.0)
MCHC: 34.3 g/dL (ref 30.0–36.0)
MCV: 84.3 fL (ref 78.0–100.0)
PLATELETS: 111 10*3/uL — AB (ref 150–400)
RBC: 3.7 MIL/uL — AB (ref 3.87–5.11)
RDW: 13.6 % (ref 11.5–15.5)
WBC: 9 10*3/uL (ref 4.0–10.5)

## 2016-07-05 LAB — COMPREHENSIVE METABOLIC PANEL
ALT: 50 U/L (ref 14–54)
AST: 47 U/L — AB (ref 15–41)
Albumin: 1.9 g/dL — ABNORMAL LOW (ref 3.5–5.0)
Alkaline Phosphatase: 93 U/L (ref 38–126)
Anion gap: 5 (ref 5–15)
BUN: 19 mg/dL (ref 6–20)
CHLORIDE: 108 mmol/L (ref 101–111)
CO2: 20 mmol/L — AB (ref 22–32)
CREATININE: 1.1 mg/dL — AB (ref 0.44–1.00)
Calcium: 7.5 mg/dL — ABNORMAL LOW (ref 8.9–10.3)
GFR calc Af Amer: >60 mL/min (ref >60)
Glucose, Bld: 78 mg/dL (ref 65–99)
POTASSIUM: 4 mmol/L (ref 3.5–5.1)
SODIUM: 133 mmol/L — AB (ref 135–145)
Total Bilirubin: 0.5 mg/dL (ref 0.3–1.2)
Total Protein: 4.2 g/dL — ABNORMAL LOW (ref 6.5–8.1)

## 2016-07-05 LAB — TYPE AND SCREEN
ABO/RH(D): O POS
ANTIBODY SCREEN: NEGATIVE

## 2016-07-05 NOTE — Progress Notes (Signed)
Patient has refused wellbutrin, protonix and colace. Patient has been educated and updated on medications and the benefits thereto.

## 2016-07-05 NOTE — Progress Notes (Signed)
Patient has been updated and educated on the benefits of using her SCD's, patient  states she will "put them on when she gets up to go to the bathroom".  Patient is disagreeable, and states "she is just tired and wants to be left alone".  Patient has been informed EFM is required once per each eight hour shift and that she needs one prior to 1500 this date for the 1st eight hour shift.

## 2016-07-05 NOTE — Progress Notes (Signed)
Pt reports no vaginal bleeding or leaking of fluid, says she cramps at times; positive fetal movement.

## 2016-07-05 NOTE — Progress Notes (Signed)
Patient has refused service  From house keeping now.

## 2016-07-05 NOTE — Progress Notes (Signed)
VASCULAR LAB PRELIMINARY  PRELIMINARY  PRELIMINARY  PRELIMINARY  Bilateral lower extremity venous duplex completed.    Preliminary report:  Bilateral:  No evidence of DVT, superficial thrombosis, or Baker's Cyst.   Kenlie Seki, RVS 07/05/2016, 8:46 AM

## 2016-07-05 NOTE — Progress Notes (Signed)
Patient uncooperative with position change for improvement EFM tracing.

## 2016-07-05 NOTE — Progress Notes (Signed)
Patient refused monitoring at this time. States she will allow at 1300.

## 2016-07-05 NOTE — Progress Notes (Signed)
Patient ID: Vanessa Wise, female   DOB: 02/20/1986, 30 y.o.   MRN: [id number] ACULTY PRACTICE ANTEPARTUM COMPREHENSIVE PROGRESS NOTE  Vanessa Wise is a 30 y.o. [id number] at 3511w2d  who is admitted for severe preeclampsia.   Fetal presentation is cephalic. Length of Stay:  9  Days  Subjective: Pt with no complaints.  No RUQ pain. She c/o HA at times which she denies at present. Patient reports good fetal movement.  She reports no uterine contractions, no bleeding and no loss of fluid per vagina.  Vitals:  Blood pressure (!) 141/96, pulse 74, temperature 98.3 F (36.8 C), temperature source Oral, resp. rate 18, height 5\' 4"  (1.626 m), weight 198 lb 12 oz (90.2 kg), last menstrual period 12/20/2015, SpO2 100 %. Physical Examination: General appearance - alert, well appearing, and in no distress Chest - clear to auscultation, no wheezes, rales or rhonchi, symmetric air entry Heart - normal rate, regular rhythm, normal S1, S2, no murmurs, rubs, clicks or gallops Abdomen - soft, nontender, nondistended, no masses or organomegaly gravid Extremities - peripheral pulses normal, no pedal edema, no clubbing or cyanosis Cervical Exam: Not evaluated.  Membranes:intact  Fetal Monitoring:  Baseline: 130's bpm, Variability: Fair (1-6 bpm), Accelerations: Non-reactive but appropriate for gestational age and Decelerations: Absent  Labs:  Results for orders placed or performed during the hospital encounter of 06/26/16 (from the past 24 hour(s))  CBC   Collection Time: 07/05/16  6:02 AM  Result Value Ref Range   WBC 9.0 4.0 - 10.5 K/uL   RBC 3.70 (L) 3.87 - 5.11 MIL/uL   Hemoglobin 10.7 (L) 12.0 - 15.0 g/dL   HCT 14.731.2 (L) 82.936.0 - 56.246.0 %   MCV 84.3 78.0 - 100.0 fL   MCH 28.9 26.0 - 34.0 pg   MCHC 34.3 30.0 - 36.0 g/dL   RDW 13.013.6 86.511.5 - 78.415.5 %   Platelets 111 (L) 150 - 400 K/uL  Comprehensive metabolic panel   Collection Time: 07/05/16  6:02 AM  Result Value Ref Range   Sodium 133 (L) 135 -  145 mmol/L   Potassium 4.0 3.5 - 5.1 mmol/L   Chloride 108 101 - 111 mmol/L   CO2 20 (L) 22 - 32 mmol/L   Glucose, Bld 78 65 - 99 mg/dL   BUN 19 6 - 20 mg/dL   Creatinine, Ser 6.961.10 (H) 0.44 - 1.00 mg/dL   Calcium 7.5 (L) 8.9 - 10.3 mg/dL   Total Protein 4.2 (L) 6.5 - 8.1 g/dL   Albumin 1.9 (L) 3.5 - 5.0 g/dL   AST 47 (H) 15 - 41 U/L   ALT 50 14 - 54 U/L   Alkaline Phosphatase 93 38 - 126 U/L   Total Bilirubin 0.5 0.3 - 1.2 mg/dL   GFR calc non Af Amer >60 >60 mL/min   GFR calc Af Amer >60 >60 mL/min   Anion gap 5 5 - 15    Imaging Studies:    n/a   Medications:  Scheduled . buPROPion  150 mg Oral BID  . cloNIDine  0.1 mg Oral TID  . docusate sodium  100 mg Oral Daily  . enoxaparin (LOVENOX) injection  30 mg Subcutaneous Once  . hydrALAZINE  50 mg Oral Q8H  . labetalol  800 mg Oral Q8H  . pantoprazole  40 mg Oral Daily  . prenatal multivitamin  1 tablet Oral Q1200  . sodium chloride flush  3 mL Intravenous Q12H   I have reviewed the patient's current  medications.  ASSESSMENT: Patient Active Problem List   Diagnosis Date Noted  . Preeclampsia 06/26/2016  . Cannabis dependence 01/28/2012  . Chronic migraine 01/28/2012  . TOBACCO USER 11/06/2009    PLAN: 1) Severe preeclampsia - Labetalol now increased to 800 mg TID. - Clonidine 0.1 mg TID - Hydralazine 50 TID - Continue monitoring BP - Continue current antepartum care Deliver for worsening maternal or fetal indications. Continue routine antenatal care.   HARRAWAY-SMITH, Cayden Granholm 07/05/2016,5:59 PM

## 2016-07-06 ENCOUNTER — Encounter (HOSPITAL_COMMUNITY): Payer: Self-pay | Admitting: *Deleted

## 2016-07-06 DIAGNOSIS — O9962 Diseases of the digestive system complicating childbirth: Secondary | ICD-10-CM

## 2016-07-06 DIAGNOSIS — O99324 Drug use complicating childbirth: Secondary | ICD-10-CM

## 2016-07-06 DIAGNOSIS — O99334 Smoking (tobacco) complicating childbirth: Secondary | ICD-10-CM

## 2016-07-06 DIAGNOSIS — Z3A27 27 weeks gestation of pregnancy: Secondary | ICD-10-CM

## 2016-07-06 DIAGNOSIS — O1414 Severe pre-eclampsia complicating childbirth: Secondary | ICD-10-CM

## 2016-07-06 LAB — CBC
HEMATOCRIT: 32.9 % — AB (ref 36.0–46.0)
Hemoglobin: 11.1 g/dL — ABNORMAL LOW (ref 12.0–15.0)
MCH: 28.7 pg (ref 26.0–34.0)
MCHC: 33.7 g/dL (ref 30.0–36.0)
MCV: 85 fL (ref 78.0–100.0)
PLATELETS: 111 10*3/uL — AB (ref 150–400)
RBC: 3.87 MIL/uL (ref 3.87–5.11)
RDW: 13.5 % (ref 11.5–15.5)
WBC: 9.4 10*3/uL (ref 4.0–10.5)

## 2016-07-06 LAB — COMPREHENSIVE METABOLIC PANEL
ALBUMIN: 2.1 g/dL — AB (ref 3.5–5.0)
ALT: 75 U/L — AB (ref 14–54)
AST: 58 U/L — AB (ref 15–41)
Alkaline Phosphatase: 97 U/L (ref 38–126)
Anion gap: 4 — ABNORMAL LOW (ref 5–15)
BILIRUBIN TOTAL: 0.4 mg/dL (ref 0.3–1.2)
BUN: 17 mg/dL (ref 6–20)
CHLORIDE: 108 mmol/L (ref 101–111)
CO2: 21 mmol/L — ABNORMAL LOW (ref 22–32)
CREATININE: 1.09 mg/dL — AB (ref 0.44–1.00)
Calcium: 7.7 mg/dL — ABNORMAL LOW (ref 8.9–10.3)
GFR calc Af Amer: >60 mL/min (ref >60)
GLUCOSE: 87 mg/dL (ref 65–99)
Potassium: 4.4 mmol/L (ref 3.5–5.1)
Sodium: 133 mmol/L — ABNORMAL LOW (ref 135–145)
Total Protein: 4.8 g/dL — ABNORMAL LOW (ref 6.5–8.1)

## 2016-07-06 MED ORDER — OXYCODONE-ACETAMINOPHEN 5-325 MG PO TABS: 2.0000 | ORAL_TABLET | ORAL | Status: DC | PRN

## 2016-07-06 MED ORDER — ONDANSETRON HCL 4 MG/2ML IJ SOLN
4.0000 mg | Freq: Four times a day (QID) | INTRAMUSCULAR | Status: DC | PRN
  Administered 2016-07-07: 4 mg via INTRAVENOUS
  Filled 2016-07-06: qty 2

## 2016-07-06 MED ORDER — LACTATED RINGERS IV SOLN
INTRAVENOUS | Status: DC
  Administered 2016-07-06: 23:00:00 via INTRAVENOUS

## 2016-07-06 MED ORDER — LACTATED RINGERS IV SOLN
2.0000 g/h | INTRAVENOUS | Status: DC
  Filled 2016-07-06: qty 80

## 2016-07-06 MED ORDER — PHENYLEPHRINE 40 MCG/ML (10ML) SYRINGE FOR IV PUSH (FOR BLOOD PRESSURE SUPPORT)
80.0000 ug | PREFILLED_SYRINGE | INTRAVENOUS | Status: DC | PRN
  Filled 2016-07-06: qty 10

## 2016-07-06 MED ORDER — LIDOCAINE HCL (PF) 1 % IJ SOLN: 30.0000 mL | INTRAMUSCULAR | Status: DC | PRN

## 2016-07-06 MED ORDER — EPHEDRINE 5 MG/ML INJ: 10.0000 mg | INTRAVENOUS | Status: DC | PRN

## 2016-07-06 MED ORDER — LACTATED RINGERS IV SOLN: 500.0000 mL | Freq: Once | INTRAVENOUS | Status: DC

## 2016-07-06 MED ORDER — LIDOCAINE HCL (PF) 1 % IJ SOLN
INTRAMUSCULAR | Status: DC | PRN
  Administered 2016-07-06: 3 mL

## 2016-07-06 MED ORDER — DIPHENHYDRAMINE HCL 50 MG/ML IJ SOLN: 12.5000 mg | INTRAMUSCULAR | Status: DC | PRN

## 2016-07-06 MED ORDER — MISOPROSTOL 25 MCG QUARTER TABLET
25.0000 ug | ORAL_TABLET | ORAL | Status: DC | PRN
  Administered 2016-07-06: 25 ug via VAGINAL
  Filled 2016-07-06: qty 0.25

## 2016-07-06 MED ORDER — HYDRALAZINE HCL 25 MG PO TABS
100.0000 mg | ORAL_TABLET | Freq: Three times a day (TID) | ORAL | Status: DC
  Administered 2016-07-06: 100 mg via ORAL
  Filled 2016-07-06 (×6): qty 4

## 2016-07-06 MED ORDER — SODIUM CHLORIDE 0.9 % IJ SOLN
INTRAMUSCULAR | Status: AC
  Filled 2016-07-06: qty 10

## 2016-07-06 MED ORDER — ACETAMINOPHEN 325 MG PO TABS: 650.0000 mg | ORAL_TABLET | ORAL | Status: DC | PRN

## 2016-07-06 MED ORDER — OXYTOCIN 40 UNITS IN LACTATED RINGERS INFUSION - SIMPLE MED: 2.5000 [IU]/h | INTRAVENOUS | Status: DC

## 2016-07-06 MED ORDER — LABETALOL HCL 5 MG/ML IV SOLN
INTRAVENOUS | Status: AC
  Filled 2016-07-06: qty 4

## 2016-07-06 MED ORDER — OXYCODONE-ACETAMINOPHEN 5-325 MG PO TABS
1.0000 | ORAL_TABLET | ORAL | Status: DC | PRN
Start: 2016-07-06 — End: 2016-07-07

## 2016-07-06 MED ORDER — MAGNESIUM SULFATE BOLUS VIA INFUSION
4.0000 g | Freq: Once | INTRAVENOUS | Status: AC
  Administered 2016-07-06: 4 g via INTRAVENOUS
  Filled 2016-07-06: qty 500

## 2016-07-06 MED ORDER — LACTATED RINGERS IV SOLN: 500.0000 mL | INTRAVENOUS | Status: DC | PRN

## 2016-07-06 MED ORDER — TERBUTALINE SULFATE 1 MG/ML IJ SOLN: 0.2500 mg | Freq: Once | INTRAMUSCULAR | Status: DC | PRN

## 2016-07-06 MED ORDER — SOD CITRATE-CITRIC ACID 500-334 MG/5ML PO SOLN
30.0000 mL | ORAL | Status: DC | PRN
  Administered 2016-07-07: 30 mL via ORAL
  Filled 2016-07-06: qty 15

## 2016-07-06 MED ORDER — OXYTOCIN BOLUS FROM INFUSION: 500.0000 mL | Freq: Once | INTRAVENOUS | Status: DC

## 2016-07-06 MED ORDER — FENTANYL 2.5 MCG/ML BUPIVACAINE 1/10 % EPIDURAL INFUSION (WH - ANES): 14.0000 mL/h | INTRAMUSCULAR | Status: DC | PRN

## 2016-07-06 NOTE — Progress Notes (Signed)
   07/06/16 1501  Vital Signs  BP (!) 167/115  BP Location Left Arm  Patient Position (if appropriate) Sitting  BP Method Automatic  Pulse Rate 70  Pulse Rate Source Dinamap  Resp 18  Temp 98.2 F (36.8 C)  Temp Source Oral  Oxygen Therapy  SpO2 100 %  O2 Device Room Air  PO Hydralazine, Labetalol, & Catapres given. Will recheck BP.

## 2016-07-06 NOTE — Progress Notes (Signed)
Pt transferred to antenatal via w/c for continuous fetal monitoring. Report called to Mikle Bosworthanya Willis, RN

## 2016-07-06 NOTE — Progress Notes (Signed)
Pt agrees to continuous EFM and transfer to Antenatal. Dr. Debroah LoopArnold at bedside to discuss POC.

## 2016-07-06 NOTE — Progress Notes (Signed)
Decreased fetal HR variability and occasional variable deceleration noted, will transfer back to antepartum unit for continuous monitoring  Vanessa PhenixJames G Mally [name], MD

## 2016-07-06 NOTE — Anesthesia Procedure Notes (Signed)
Epidural Patient location during procedure: OB Start time: 07/06/2016 9:00 PM  Staffing Anesthesiologist: Sherrian DiversENENNY, Marrianne Sica  Preanesthetic Checklist Completed: patient identified, site marked, surgical consent, pre-op evaluation, timeout performed, IV checked, risks and benefits discussed and monitors and equipment checked  Epidural Patient position: sitting Prep: DuraPrep Patient monitoring: blood pressure and heart rate Approach: midline Location: L3-L4 Injection technique: LOR saline  Needle:  Needle type: Tuohy  Needle gauge: 17 G Needle length: 9 cm Needle insertion depth: 6 cm Catheter type: closed end flexible Catheter size: 19 Gauge Catheter at skin depth: 13 cm Test dose: negative and Other  Assessment Events: blood not aspirated, injection not painful, no injection resistance, negative IV test and no paresthesia  Additional Notes Reason for block:at surgeon's request and procedure for pain

## 2016-07-06 NOTE — Progress Notes (Addendum)
Pt stated "I'm only taking BP medicines - no others (refused Welbutrin, Protonix, & Colace). She also refused EFM - "I'll do it later when I get up".  No IV access observed - bandaid observed on left forearm - Pt stated "no I don't have one".

## 2016-07-06 NOTE — Progress Notes (Signed)
Patient ID: Vanessa MelnickLashonda M Mcnamee, female   DOB: 06/10/1986, 30 y.o.   MRN: [id number]  S: Patient seen & examined. Patient has not been started with her induction due to blood pressure has been uncontrolled. Patient is comfortable in bed.  O:  Vitals:   07/06/16 1911 07/06/16 1915 07/06/16 1936 07/06/16 2000  BP: (!) 144/87  (!) 145/87 (!) 161/109  Pulse: 74 75 70 73  Resp: 20  18   Temp:   97.8 F (36.6 C)   TempSrc:   Oral   SpO2:  98%    Weight:      Height:        FHT: 130, mod var, no accels, no decels (gestational age is 10928 weeks) TOCO: quiet  Dilation: Closed Effacement (%): Thick Station: -3 Exam by:: Dr. Omer JackMumaw   A/P: 30 y.o. [id number] 6758w3d being induced for preeclampsia with severe features (severe range BPs, uncontrolled with multiple medications).  Started Magnesium gtt Cytotec placed Dry cath epidural performed, dose meds upon maternal request when contractions are painful Monitor BPs closely, increased hydralazine 50 -> 100 TID.

## 2016-07-06 NOTE — Progress Notes (Signed)
Requesting to start IV, draw current set of labs and clarifying BP orders for severe BP's. MD prefers to start with one dose of Apresoline and then to progress to the Labetalol 20,40,80 protocol prn.

## 2016-07-06 NOTE — Progress Notes (Signed)
FACULTY PRACTICE ANTEPARTUM(COMPREHENSIVE) NOTE  Vanessa MelnickLashonda M Leopard is a 30 y.o. [id number] at 4362w3d by early ultrasound who is admitted for preeclampsia.   Fetal presentation is cephalic. Length of Stay:  10  Days  Subjective: No c/o headache Patient reports the fetal movement as active. Patient reports uterine contraction  activity as none. Patient reports  vaginal bleeding as none. Patient describes fluid per vagina as None.  Vitals:  Blood pressure (!) 154/87, pulse 77, temperature 97.6 F (36.4 C), temperature source Axillary, resp. rate 16, height 5\' 4"  (1.626 m), weight 89.4 kg (197 lb), last menstrual period 12/20/2015, SpO2 97 %. Physical Examination:  General appearance - alert, well appearing, and in no distress Heart - normal rate and regular rhythm Abdomen - soft, nontender, nondistended Fundal Height:  size equals dates Cervical Exam: Not evaluated. Extremities: extremities normal, Membranes:intact  Fetal Monitoring:     Fetal Heart Rate A  Mode -- [pt refused monitoring at this time] filed at 07/06/2016 1000  Baseline Rate (A) 135 bpm filed at 07/05/2016 2315  Variability <5 BPM, 6-25 BPM filed at 07/05/2016 2315  Accelerations None filed at 07/05/2016 2315  Decelerations Variable filed at 07/05/2016 2315  Multiple birth? N filed at 06/26/2016 1715       Labs:  Results for orders placed or performed during the hospital encounter of 06/26/16 (from the past 24 hour(s))  Type and screen [facility]   Collection Time: 07/05/16  7:29 PM  Result Value Ref Range   ABO/RH(D) O POS    Antibody Screen NEG    Sample Expiration 07/08/2016       Medications:  Scheduled . buPROPion  150 mg Oral BID  . cloNIDine  0.1 mg Oral TID  . docusate sodium  100 mg Oral Daily  . hydrALAZINE  50 mg Oral Q8H  . labetalol  800 mg Oral Q8H  . pantoprazole  40 mg Oral Daily  . prenatal multivitamin  1 tablet Oral Q1200  . sodium chloride flush  3 mL Intravenous  Q12H   I have reviewed the patient's current medications.  ASSESSMENT: Patient Active Problem List   Diagnosis Date Noted  . [redacted] weeks gestation of pregnancy   . Preeclampsia 06/26/2016  . Cannabis dependence 01/28/2012  . Chronic migraine 01/28/2012  . TOBACCO USER 11/06/2009    PLAN: Continue present antihypertensives as control is acceptable for now  Orrie Schubert 07/06/2016,12:01 PM

## 2016-07-06 NOTE — Progress Notes (Signed)
No order received for IV at this time.

## 2016-07-06 NOTE — Progress Notes (Signed)
Vanessa MelnickLashonda M Wise is a 30 y.o. G3P1102 at 6036w3d by ultrasound admitted for preeclampsia Sent ot L&D for IOL severe pre-eclampsia Subjective: right sided headache   Objective: BP (!) 150/98   Pulse 80   Temp 98.3 F (36.8 C)   Resp 16   Ht 5\' 4"  (1.626 m)   Wt 89.4 kg (197 lb)   LMP 12/20/2015   SpO2 100%   BMI 33.81 kg/m  I/O last 3 completed shifts: In: -  Out: 300 [Urine:300] No intake/output data recorded.  FHT:   Fetal Heart Rate A  Mode External filed at 07/06/2016 1808  Baseline Rate (A) 130 bpm filed at 07/06/2016 1808  Variability <5 BPM filed at 07/06/2016 1808  Accelerations None filed at 07/06/2016 1808  Decelerations Variable [gradual onset decel without presence of ctx] filed at 07/06/2016 16101808  Multiple birth? N filed at 06/26/2016 1715    UC:   none SVE:     0/0/-3 Dr Omer JackMumaw  Labs: Lab Results  Component Value Date   WBC 9.4 07/06/2016   HGB 11.1 (L) 07/06/2016   HCT 32.9 (L) 07/06/2016   MCV 85.0 07/06/2016   PLT 111 (L) 07/06/2016    Assessment / Plan: Induction of labor due to preeclampsia,  progressing well on pitocin  Labor: cervical ripening ordered Preeclampsia:  on magnesium sulfate and labs stable Fetal Wellbeing:  Category II Pain Control:  Epidural I/D:  n/a GBS negative Anticipated MOD:  NSVD  ARNOLD,JAMES 07/06/2016, 6:43 PM

## 2016-07-06 NOTE — Plan of Care (Signed)
Problem: Activity: Goal: Risk for activity intolerance will decrease Outcome: Completed/Met Date Met: 07/06/16 Up ad lib, tolerated well.  Problem: Nutrition: Goal: Adequate nutrition will be maintained Outcome: Completed/Met Date Met: 07/06/16 Finishes all serving from her dinner plate.

## 2016-07-06 NOTE — Progress Notes (Signed)
BP = 168/100 denies any accompanying symptoms.  Patient requested to take her "blood pressure meds" as opposed to IV labetalol.  " I want to rest so that blood pressure will be fine if you all quit bothering me."  0552  Rechecked BP =147/87

## 2016-07-06 NOTE — Anesthesia Preprocedure Evaluation (Addendum)
Anesthesia Evaluation  Patient identified by MRN, date of birth, ID band Patient awake    Reviewed: Allergy & Precautions, NPO status , Patient's Chart, lab work & pertinent test results  Airway Mallampati: II  TM Distance: >3 FB Neck ROM: Full    Dental no notable dental hx.    Pulmonary neg pulmonary ROS, Current Smoker,    Pulmonary exam normal breath sounds clear to auscultation       Cardiovascular hypertension, Normal cardiovascular exam Rhythm:Regular Rate:Normal     Neuro/Psych  Headaches, PSYCHIATRIC DISORDERS Anxiety    GI/Hepatic negative GI ROS, Neg liver ROS,   Endo/Other  negative endocrine ROS  Renal/GU negative Renal ROS  negative genitourinary   Musculoskeletal negative musculoskeletal ROS (+)   Abdominal   Peds negative pediatric ROS (+)  Hematology negative hematology ROS (+)   Anesthesia Other Findings   Reproductive/Obstetrics negative OB ROS                             Anesthesia Physical Anesthesia Plan  ASA: III  Anesthesia Plan: Epidural   Post-op Pain Management:    Induction: Intravenous  Airway Management Planned: Natural Airway  Additional Equipment:   Intra-op Plan:   Post-operative Plan:   Informed Consent: I have reviewed the patients History and Physical, chart, labs and discussed the procedure including the risks, benefits and alternatives for the proposed anesthesia with the patient or authorized representative who has indicated his/her understanding and acceptance.   Dental advisory given  Plan Discussed with: CRNA  Anesthesia Plan Comments: (Informed consent obtained prior to proceeding including risk of failure, 1% risk of PDPH, risk of minor discomfort and bruising.  Discussed rare but serious complications including epidural abscess, permanent nerve injury, epidural hematoma.  Discussed alternatives to epidural analgesia and patient  desires to proceed.  Timeout performed pre-procedure verifying patient name, procedure, and platelet count.  Patient tolerated procedure well.  Patient with preeclampsia with headache. Platelet count stable at 111K since 0600. No signs of coagulopathy. On magnesium. This will be a "dry" catheter for now per OB request.  0130: C/S called for non-reassurring FHR. Epidural was a dry catheter. Will try to use for C/S . Discussed backup of GA. Patient consents.)    Anesthesia Quick Evaluation

## 2016-07-06 NOTE — Progress Notes (Signed)
Pt says she will allow me to place her on the fetal monitor after she eats her food. I told her I would check back with her in 1-2 hrs.

## 2016-07-06 NOTE — Progress Notes (Signed)
Pt questioning why she only received 1 BP medication @10 :00 - "I take 7 pills". Attempted to explain dosing schedule & #s of pills for each medication. Pt requested to see her doctor or mid-wife. Dr Debroah LoopArnold at the desk - updated on pts concerns, & refusal of EFM & other medications, also that pt has no IV since it "got caught on my robe". Spoke with pharmacy to schedule all BP meds at the same time (all are every 8 hrs but ordered at different times). Pt updated about POC. I requested that she let me know when we can do her EFM.

## 2016-07-07 ENCOUNTER — Inpatient Hospital Stay (HOSPITAL_COMMUNITY): Payer: Medicaid Other | Admitting: Anesthesiology

## 2016-07-07 ENCOUNTER — Encounter (HOSPITAL_COMMUNITY): Payer: Self-pay | Admitting: *Deleted

## 2016-07-07 ENCOUNTER — Encounter (HOSPITAL_COMMUNITY)
Admission: AD | Disposition: A | Discharge status: Home / Self Care | Payer: Self-pay | Source: Ambulatory Visit | Attending: Obstetrics and Gynecology

## 2016-07-07 DIAGNOSIS — Z3A27 27 weeks gestation of pregnancy: Secondary | ICD-10-CM

## 2016-07-07 DIAGNOSIS — O9962 Diseases of the digestive system complicating childbirth: Secondary | ICD-10-CM

## 2016-07-07 DIAGNOSIS — O99324 Drug use complicating childbirth: Secondary | ICD-10-CM

## 2016-07-07 DIAGNOSIS — O99334 Smoking (tobacco) complicating childbirth: Secondary | ICD-10-CM

## 2016-07-07 DIAGNOSIS — O1414 Severe pre-eclampsia complicating childbirth: Secondary | ICD-10-CM

## 2016-07-07 LAB — COMPREHENSIVE METABOLIC PANEL
ALT: 70 U/L — ABNORMAL HIGH (ref 14–54)
AST: 51 U/L — AB (ref 15–41)
Albumin: 2 g/dL — ABNORMAL LOW (ref 3.5–5.0)
Alkaline Phosphatase: 95 U/L (ref 38–126)
Anion gap: 3 — ABNORMAL LOW (ref 5–15)
BUN: 17 mg/dL (ref 6–20)
CHLORIDE: 107 mmol/L (ref 101–111)
CO2: 22 mmol/L (ref 22–32)
Calcium: 7.5 mg/dL — ABNORMAL LOW (ref 8.9–10.3)
Creatinine, Ser: 1.28 mg/dL — ABNORMAL HIGH (ref 0.44–1.00)
GFR calc Af Amer: >60 mL/min (ref >60)
GFR, EST NON AFRICAN AMERICAN: 56 mL/min — AB (ref >60)
Glucose, Bld: 111 mg/dL — ABNORMAL HIGH (ref 65–99)
POTASSIUM: 4.6 mmol/L (ref 3.5–5.1)
SODIUM: 132 mmol/L — AB (ref 135–145)
Total Bilirubin: 0.4 mg/dL (ref 0.3–1.2)
Total Protein: 4.8 g/dL — ABNORMAL LOW (ref 6.5–8.1)

## 2016-07-07 LAB — CBC
HEMATOCRIT: 31.9 % — AB (ref 36.0–46.0)
Hemoglobin: 10.8 g/dL — ABNORMAL LOW (ref 12.0–15.0)
MCH: 28.6 pg (ref 26.0–34.0)
MCHC: 33.9 g/dL (ref 30.0–36.0)
MCV: 84.6 fL (ref 78.0–100.0)
Platelets: 99 10*3/uL — ABNORMAL LOW (ref 150–400)
RBC: 3.77 MIL/uL — AB (ref 3.87–5.11)
RDW: 13.5 % (ref 11.5–15.5)
WBC: 10.9 10*3/uL — AB (ref 4.0–10.5)

## 2016-07-07 SURGERY — Surgical Case
Anesthesia: Epidural

## 2016-07-07 MED ORDER — EPHEDRINE SULFATE 50 MG/ML IJ SOLN
INTRAMUSCULAR | Status: DC | PRN
  Administered 2016-07-07: 5 mg via INTRAVENOUS
  Administered 2016-07-07 (×2): 10 mg via INTRAVENOUS

## 2016-07-07 MED ORDER — SIMETHICONE 80 MG PO CHEW
80.0000 mg | CHEWABLE_TABLET | ORAL | Status: DC
  Administered 2016-07-08 (×2): 80 mg via ORAL
  Filled 2016-07-07 (×2): qty 1

## 2016-07-07 MED ORDER — MEPERIDINE HCL 25 MG/ML IJ SOLN: 6.2500 mg | INTRAMUSCULAR | Status: DC | PRN

## 2016-07-07 MED ORDER — OXYTOCIN 10 UNIT/ML IJ SOLN
INTRAMUSCULAR | Status: AC
  Filled 2016-07-07: qty 4

## 2016-07-07 MED ORDER — PROPOFOL 10 MG/ML IV BOLUS
INTRAVENOUS | Status: DC | PRN
  Administered 2016-07-07: 150 mg via INTRAVENOUS

## 2016-07-07 MED ORDER — PROPOFOL 10 MG/ML IV BOLUS
INTRAVENOUS | Status: AC
  Filled 2016-07-07: qty 20

## 2016-07-07 MED ORDER — NALBUPHINE HCL 10 MG/ML IJ SOLN: 5.0000 mg | INTRAMUSCULAR | Status: DC | PRN

## 2016-07-07 MED ORDER — OXYTOCIN 40 UNITS IN LACTATED RINGERS INFUSION - SIMPLE MED
2.5000 [IU]/h | INTRAVENOUS | Status: AC
Start: 2016-07-07 — End: 2016-07-07

## 2016-07-07 MED ORDER — NALOXONE HCL 2 MG/2ML IJ SOSY
1.0000 ug/kg/h | PREFILLED_SYRINGE | INTRAVENOUS | Status: DC | PRN
  Filled 2016-07-07: qty 2

## 2016-07-07 MED ORDER — FENTANYL CITRATE (PF) 250 MCG/5ML IJ SOLN
INTRAMUSCULAR | Status: DC | PRN
  Administered 2016-07-07: 250 ug via INTRAVENOUS

## 2016-07-07 MED ORDER — DEXAMETHASONE SODIUM PHOSPHATE 4 MG/ML IJ SOLN
INTRAMUSCULAR | Status: AC
  Filled 2016-07-07: qty 1

## 2016-07-07 MED ORDER — ONDANSETRON HCL 4 MG/2ML IJ SOLN
INTRAMUSCULAR | Status: DC | PRN
  Administered 2016-07-07: 4 mg via INTRAVENOUS

## 2016-07-07 MED ORDER — ZOLPIDEM TARTRATE 5 MG PO TABS: 5.0000 mg | ORAL_TABLET | Freq: Every evening | ORAL | Status: DC | PRN

## 2016-07-07 MED ORDER — DIPHENHYDRAMINE HCL 25 MG PO CAPS: 25.0000 mg | ORAL_CAPSULE | Freq: Four times a day (QID) | ORAL | Status: DC | PRN

## 2016-07-07 MED ORDER — LABETALOL HCL 5 MG/ML IV SOLN: 40.0000 mg | Freq: Once | INTRAVENOUS | Status: DC | PRN

## 2016-07-07 MED ORDER — ACETAMINOPHEN 500 MG PO TABS
1000.0000 mg | ORAL_TABLET | Freq: Four times a day (QID) | ORAL | Status: AC
  Administered 2016-07-07 – 2016-07-08 (×4): 1000 mg via ORAL
  Filled 2016-07-07 (×4): qty 2

## 2016-07-07 MED ORDER — NALBUPHINE HCL 10 MG/ML IJ SOLN: 5.0000 mg | Freq: Once | INTRAMUSCULAR | Status: DC | PRN

## 2016-07-07 MED ORDER — OXYTOCIN 10 UNIT/ML IJ SOLN
INTRAVENOUS | Status: DC | PRN
  Administered 2016-07-07: 40 [IU] via INTRAVENOUS

## 2016-07-07 MED ORDER — TETANUS-DIPHTH-ACELL PERTUSSIS 5-2.5-18.5 LF-MCG/0.5 IM SUSP
0.5000 mL | Freq: Once | INTRAMUSCULAR | Status: AC
  Administered 2016-07-08: 0.5 mL via INTRAMUSCULAR
  Filled 2016-07-07: qty 0.5

## 2016-07-07 MED ORDER — FENTANYL CITRATE (PF) 100 MCG/2ML IJ SOLN
100.0000 ug | INTRAMUSCULAR | Status: DC | PRN
  Filled 2016-07-07: qty 2

## 2016-07-07 MED ORDER — OXYCODONE HCL 5 MG PO TABS
5.0000 mg | ORAL_TABLET | ORAL | Status: DC | PRN
  Administered 2016-07-08 – 2016-07-09 (×3): 5 mg via ORAL
  Filled 2016-07-07 (×3): qty 1

## 2016-07-07 MED ORDER — KETOROLAC TROMETHAMINE 30 MG/ML IJ SOLN: 30.0000 mg | Freq: Four times a day (QID) | INTRAMUSCULAR | Status: AC | PRN

## 2016-07-07 MED ORDER — ONDANSETRON HCL 4 MG/2ML IJ SOLN
INTRAMUSCULAR | Status: AC
  Filled 2016-07-07: qty 2

## 2016-07-07 MED ORDER — MORPHINE SULFATE-NACL 0.5-0.9 MG/ML-% IV SOSY
PREFILLED_SYRINGE | INTRAVENOUS | Status: AC
  Filled 2016-07-07: qty 1

## 2016-07-07 MED ORDER — DEXAMETHASONE SODIUM PHOSPHATE 4 MG/ML IJ SOLN
INTRAMUSCULAR | Status: DC | PRN
  Administered 2016-07-07: 4 mg via INTRAVENOUS

## 2016-07-07 MED ORDER — LACTATED RINGERS IV SOLN
INTRAVENOUS | Status: DC
  Administered 2016-07-07 (×2): via INTRAVENOUS

## 2016-07-07 MED ORDER — FENTANYL CITRATE (PF) 250 MCG/5ML IJ SOLN
INTRAMUSCULAR | Status: AC
  Filled 2016-07-07: qty 5

## 2016-07-07 MED ORDER — SIMETHICONE 80 MG PO CHEW: 80.0000 mg | CHEWABLE_TABLET | ORAL | Status: DC | PRN

## 2016-07-07 MED ORDER — WITCH HAZEL-GLYCERIN EX PADS: 1.0000 "application " | MEDICATED_PAD | CUTANEOUS | Status: DC | PRN

## 2016-07-07 MED ORDER — SIMETHICONE 80 MG PO CHEW
80.0000 mg | CHEWABLE_TABLET | Freq: Three times a day (TID) | ORAL | Status: DC
  Administered 2016-07-07 – 2016-07-08 (×4): 80 mg via ORAL
  Filled 2016-07-07 (×5): qty 1

## 2016-07-07 MED ORDER — SENNOSIDES-DOCUSATE SODIUM 8.6-50 MG PO TABS
2.0000 | ORAL_TABLET | ORAL | Status: DC
  Administered 2016-07-08: 2 via ORAL
  Filled 2016-07-07 (×2): qty 2

## 2016-07-07 MED ORDER — COCONUT OIL OIL: 1.0000 "application " | TOPICAL_OIL | Status: DC | PRN

## 2016-07-07 MED ORDER — DIPHENHYDRAMINE HCL 25 MG PO CAPS
25.0000 mg | ORAL_CAPSULE | ORAL | Status: DC | PRN
  Filled 2016-07-07: qty 1

## 2016-07-07 MED ORDER — PRENATAL MULTIVITAMIN CH: 1.0000 | ORAL_TABLET | Freq: Every day | ORAL | Status: DC

## 2016-07-07 MED ORDER — EPHEDRINE 5 MG/ML INJ
INTRAVENOUS | Status: AC
  Filled 2016-07-07: qty 10

## 2016-07-07 MED ORDER — HYDRALAZINE HCL 50 MG PO TABS
50.0000 mg | ORAL_TABLET | Freq: Once | ORAL | Status: AC
  Administered 2016-07-07: 50 mg via ORAL
  Filled 2016-07-07: qty 1

## 2016-07-07 MED ORDER — DIBUCAINE 1 % RE OINT: 1.0000 "application " | TOPICAL_OINTMENT | RECTAL | Status: DC | PRN

## 2016-07-07 MED ORDER — CEFAZOLIN SODIUM-DEXTROSE 2-4 GM/100ML-% IV SOLN
INTRAVENOUS | Status: AC
  Filled 2016-07-07: qty 100

## 2016-07-07 MED ORDER — DIPHENHYDRAMINE HCL 50 MG/ML IJ SOLN: 12.5000 mg | INTRAMUSCULAR | Status: DC | PRN

## 2016-07-07 MED ORDER — FUROSEMIDE 10 MG/ML IJ SOLN
60.0000 mg | Freq: Once | INTRAMUSCULAR | Status: AC
  Administered 2016-07-07: 60 mg via INTRAVENOUS
  Filled 2016-07-07: qty 6

## 2016-07-07 MED ORDER — HYDRALAZINE HCL 50 MG PO TABS
50.0000 mg | ORAL_TABLET | Freq: Three times a day (TID) | ORAL | Status: DC
  Administered 2016-07-07 – 2016-07-09 (×5): 50 mg via ORAL
  Filled 2016-07-07 (×6): qty 1

## 2016-07-07 MED ORDER — SCOPOLAMINE 1 MG/3DAYS TD PT72: 1.0000 | MEDICATED_PATCH | Freq: Once | TRANSDERMAL | Status: DC

## 2016-07-07 MED ORDER — MORPHINE SULFATE (PF) 0.5 MG/ML IJ SOLN
INTRAMUSCULAR | Status: AC
  Filled 2016-07-07: qty 10

## 2016-07-07 MED ORDER — ONDANSETRON HCL 4 MG/2ML IJ SOLN: 4.0000 mg | Freq: Three times a day (TID) | INTRAMUSCULAR | Status: DC | PRN

## 2016-07-07 MED ORDER — MIDAZOLAM HCL 2 MG/2ML IJ SOLN
INTRAMUSCULAR | Status: AC
  Filled 2016-07-07: qty 2

## 2016-07-07 MED ORDER — CEFAZOLIN SODIUM-DEXTROSE 2-3 GM-% IV SOLR
INTRAVENOUS | Status: DC | PRN
Start: 2016-07-07 — End: 2016-07-07
  Administered 2016-07-07: 2 g via INTRAVENOUS

## 2016-07-07 MED ORDER — LACTATED RINGERS IV SOLN
INTRAVENOUS | Status: DC | PRN
  Administered 2016-07-07 (×2): via INTRAVENOUS

## 2016-07-07 MED ORDER — LIDOCAINE HCL (CARDIAC) 20 MG/ML IV SOLN
INTRAVENOUS | Status: AC
  Filled 2016-07-07: qty 5

## 2016-07-07 MED ORDER — SUCCINYLCHOLINE CHLORIDE 20 MG/ML IJ SOLN
INTRAMUSCULAR | Status: DC | PRN
  Administered 2016-07-07: 120 mg via INTRAVENOUS

## 2016-07-07 MED ORDER — ACETAMINOPHEN 325 MG PO TABS
650.0000 mg | ORAL_TABLET | ORAL | Status: DC | PRN
  Administered 2016-07-08: 650 mg via ORAL
  Filled 2016-07-07: qty 2

## 2016-07-07 MED ORDER — SCOPOLAMINE 1 MG/3DAYS TD PT72
MEDICATED_PATCH | TRANSDERMAL | Status: DC | PRN
  Administered 2016-07-07: 1 via TRANSDERMAL

## 2016-07-07 MED ORDER — MENTHOL 3 MG MT LOZG: 1.0000 | LOZENGE | OROMUCOSAL | Status: DC | PRN

## 2016-07-07 MED ORDER — NALOXONE HCL 0.4 MG/ML IJ SOLN: 0.4000 mg | INTRAMUSCULAR | Status: DC | PRN

## 2016-07-07 MED ORDER — MIDAZOLAM HCL 2 MG/2ML IJ SOLN
INTRAMUSCULAR | Status: DC | PRN
  Administered 2016-07-07: 2 mg via INTRAVENOUS

## 2016-07-07 MED ORDER — MORPHINE SULFATE (PF) 0.5 MG/ML IJ SOLN
INTRAMUSCULAR | Status: DC | PRN
Start: 2016-07-07 — End: 2016-07-07
  Administered 2016-07-07: 3 mg via EPIDURAL

## 2016-07-07 MED ORDER — OXYCODONE HCL 5 MG PO TABS
10.0000 mg | ORAL_TABLET | ORAL | Status: DC | PRN
  Filled 2016-07-07: qty 2

## 2016-07-07 MED ORDER — PROMETHAZINE HCL 25 MG/ML IJ SOLN: 6.2500 mg | INTRAMUSCULAR | Status: DC | PRN

## 2016-07-07 MED ORDER — SODIUM CHLORIDE 0.9% FLUSH: 3.0000 mL | INTRAVENOUS | Status: DC | PRN

## 2016-07-07 MED ORDER — FENTANYL CITRATE (PF) 100 MCG/2ML IJ SOLN: 25.0000 ug | INTRAMUSCULAR | Status: DC | PRN

## 2016-07-07 MED ORDER — MAGNESIUM SULFATE 50 % IJ SOLN
2.0000 g/h | INTRAMUSCULAR | Status: AC
  Administered 2016-07-07: 2 g/h via INTRAVENOUS
  Filled 2016-07-07: qty 80

## 2016-07-07 MED ORDER — PNEUMOCOCCAL VAC POLYVALENT 25 MCG/0.5ML IJ INJ
0.5000 mL | INJECTION | INTRAMUSCULAR | Status: DC
  Filled 2016-07-07: qty 0.5

## 2016-07-07 MED ORDER — ROCURONIUM BROMIDE 100 MG/10ML IV SOLN
INTRAVENOUS | Status: AC
Start: 2016-07-07 — End: 2016-07-07
  Filled 2016-07-07: qty 1

## 2016-07-07 MED ORDER — SODIUM BICARBONATE 8.4 % IV SOLN
INTRAVENOUS | Status: DC | PRN
  Administered 2016-07-07 (×2): 10 mL via EPIDURAL
  Administered 2016-07-07: 5 mL via EPIDURAL

## 2016-07-07 MED ORDER — NIFEDIPINE 10 MG PO CAPS
10.0000 mg | ORAL_CAPSULE | ORAL | Status: DC | PRN
  Administered 2016-07-07: 10 mg via ORAL
  Filled 2016-07-07: qty 2
  Filled 2016-07-07: qty 1

## 2016-07-07 SURGICAL SUPPLY — 29 items
BARRIER ADHS 3X4 INTERCEED (GAUZE/BANDAGES/DRESSINGS) IMPLANT
CHLORAPREP W/TINT 26ML (MISCELLANEOUS) ×3 IMPLANT
CLAMP CORD UMBIL (MISCELLANEOUS) IMPLANT
CLOTH BEACON ORANGE TIMEOUT ST (SAFETY) ×3 IMPLANT
DRSG OPSITE POSTOP 4X10 (GAUZE/BANDAGES/DRESSINGS) ×3 IMPLANT
ELECT REM PT RETURN 9FT ADLT (ELECTROSURGICAL) ×3
ELECTRODE REM PT RTRN 9FT ADLT (ELECTROSURGICAL) ×1 IMPLANT
EXTRACTOR VACUUM KIWI (MISCELLANEOUS) IMPLANT
GLOVE BIO SURGEON STRL SZ 6.5 (GLOVE) ×2 IMPLANT
GLOVE BIO SURGEONS STRL SZ 6.5 (GLOVE) ×1
GLOVE BIOGEL PI IND STRL 7.0 (GLOVE) ×2 IMPLANT
GLOVE BIOGEL PI INDICATOR 7.0 (GLOVE) ×4
GOWN STRL REUS W/TWL LRG LVL3 (GOWN DISPOSABLE) ×6 IMPLANT
KIT ABG SYR 3ML LUER SLIP (SYRINGE) IMPLANT
NEEDLE HYPO 22GX1.5 SAFETY (NEEDLE) IMPLANT
NEEDLE HYPO 25X5/8 SAFETYGLIDE (NEEDLE) IMPLANT
NS IRRIG 1000ML POUR BTL (IV SOLUTION) ×3 IMPLANT
PACK C SECTION WH (CUSTOM PROCEDURE TRAY) ×3 IMPLANT
PAD OB MATERNITY 4.3X12.25 (PERSONAL CARE ITEMS) ×3 IMPLANT
PENCIL SMOKE EVAC W/HOLSTER (ELECTROSURGICAL) ×3 IMPLANT
RETRACTOR WND ALEXIS 25 LRG (MISCELLANEOUS) IMPLANT
RTRCTR WOUND ALEXIS 25CM LRG (MISCELLANEOUS)
SUT VIC AB 0 CT1 36 (SUTURE) ×18 IMPLANT
SUT VIC AB 2-0 CT1 27 (SUTURE) ×2
SUT VIC AB 2-0 CT1 TAPERPNT 27 (SUTURE) ×1 IMPLANT
SUT VIC AB 4-0 PS2 27 (SUTURE) ×3 IMPLANT
SYR CONTROL 10ML LL (SYRINGE) IMPLANT
TOWEL OR 17X24 6PK STRL BLUE (TOWEL DISPOSABLE) ×3 IMPLANT
TRAY FOLEY CATH SILVER 14FR (SET/KITS/TRAYS/PACK) IMPLANT

## 2016-07-07 NOTE — Anesthesia Procedure Notes (Signed)
Performed by: Dewaine Morocho L       

## 2016-07-07 NOTE — Progress Notes (Signed)
Pt BP continuing to rise, Dr. Debroah LoopArnold called regarding BP, low temperature.  In to see patient, orders received

## 2016-07-07 NOTE — Anesthesia Procedure Notes (Signed)
Procedure Name: Intubation Date/Time: 07/07/2016 1:44 AM Performed by: Renford DillsMULLINS, Leshia Kope L Pre-anesthesia Checklist: Patient identified, Emergency Drugs available, Suction available and Patient being monitored Patient Re-evaluated:Patient Re-evaluated prior to inductionOxygen Delivery Method: Circle system utilized Preoxygenation: Pre-oxygenation with 100% oxygen Intubation Type: IV induction, Rapid sequence and Cricoid Pressure applied Laryngoscope Size: Miller and 2 Grade View: Grade I Tube type: Oral Tube size: 7.0 mm Number of attempts: 1 Airway Equipment and Method: Stylet Placement Confirmation: ETT inserted through vocal cords under direct vision,  positive ETCO2 and breath sounds checked- equal and bilateral Secured at: 20 cm Tube secured with: Tape Dental Injury: Teeth and Oropharynx as per pre-operative assessment

## 2016-07-07 NOTE — Anesthesia Postprocedure Evaluation (Signed)
Anesthesia Post Note  Patient: Vanessa Wise  Procedure(s) Performed: Procedure(s) (LRB): CESAREAN SECTION (N/A)  Patient location during evaluation: PACU Anesthesia Type: General and Epidural Level of consciousness: awake and alert Pain management: pain level controlled Vital Signs Assessment: post-procedure vital signs reviewed and stable Respiratory status: spontaneous breathing, nonlabored ventilation, respiratory function stable and patient connected to nasal cannula oxygen Cardiovascular status: blood pressure returned to baseline and stable Postop Assessment: no signs of nausea or vomiting, epidural receding and patient able to bend at knees Anesthetic complications: no Comments: Patient with no complaints. Discussed with her the need for general anesthesia as her epidural level was taking too long to achieve. She seems satisfied.     Last Vitals:  Vitals:   07/07/16 0906 07/07/16 1026  BP:    Pulse: 71 71  Resp: 16 16  Temp:      Last Pain:  Vitals:   07/07/16 1031  TempSrc:   PainSc: 0-No pain   Pain Goal: Patients Stated Pain Goal: 2 (07/05/16 0726)               [name] J

## 2016-07-07 NOTE — Progress Notes (Signed)
Patient tossing and turning in bed due contraction pain, cannot trace FHR, remaining at bedside with Lurena NidaLeann Banks RN, attempting to trace FHR.

## 2016-07-07 NOTE — Lactation Note (Signed)
This note was copied from a baby's chart. Lactation Consultation Note  Patient Name: Vanessa Wise ZOXWR'UToday's Date: 07/07/2016 Reason for consult: Initial assessment;NICU baby;Infant < 6lbs   Spoke with mother briefly. She says she plans to breastfeed. She declined a pump to be set up at this time. She did not want me to review handouts with her. BF Resources Handout, Hand Expression Handout and Providing Milk for Your Baby in NICU left at bedside. Mom did not want to review hand expression at this time. Told mom to let us know how we can help her. Follow up tomorrow and prn. RN is aware pump needs to be set up.    Maternal Data Formula Feeding for Exclusion: No Does the patient have breastfeeding experience prior to this delivery?: Yes ("a while ago")  Feeding    LATCH Score/Interventions                      Lactation Tools Discussed/Used WIC Program: No (plans to apply)   Consult Status Consult Status: Follow-up Date: 07/08/16 Follow-up type: In-patient    Vanessa Wise 07/07/2016, 11:39 AM

## 2016-07-07 NOTE — Progress Notes (Signed)
Platelets results called to Dr. Payton Doughtyenneny, okay to pull epidural order given, discussed hypothermia, will continue to monitor in PACU until normothermic

## 2016-07-07 NOTE — Progress Notes (Signed)
  See H&P by L. Leftwich Craige Cotta-Kirby 06/26/16  Adam PhenixJames G Arnold, MD

## 2016-07-07 NOTE — Transfer of Care (Signed)
Immediate Anesthesia Transfer of Care Note  Patient: Marga MelnickLashonda M Propst  Procedure(s) Performed: Procedure(s): CESAREAN SECTION (N/A)  Patient Location: PACU  Anesthesia Type:General and Epidural  Level of Consciousness: sedated and lethargic  Airway & Oxygen Therapy: Patient Spontanous Breathing and Patient connected to nasal cannula oxygen  Post-op Assessment: Report given to RN and Post -op Vital signs reviewed and stable  Post vital signs: stable  Last Vitals:  Vitals:   07/07/16 0102 07/07/16 0132  BP: 115/60 104/61  Pulse: 87 81  Resp: 20   Temp:      Last Pain:  Vitals:   07/06/16 2300  TempSrc: Oral  PainSc:       Patients Stated Pain Goal: 2 (07/05/16 0726)  Complications: No apparent anesthesia complications

## 2016-07-07 NOTE — Op Note (Signed)
Cesarean Section Procedure Note  Indications: severe preeclampsia, fetal intolerance of labor  Pre-operative Diagnosis: 28 week 4 day pregnancy. Severe preeclampsia  Post-operative Diagnosis: same  Surgeon: Scheryl DarterARNOLD,Haneen Bernales   Assistants: none  Anesthesia: General endotracheal anesthesia and Epidural anesthesia  ASA Class: 2   Procedure Details   The patient was seen in the room 164. She was induced for severe preeclampsia and fetal decelerations were noted with the cervix dilated 1 cm. She consented to cesarean section. The risks, benefits, complications, treatment options, and expected outcomes were discussed with the patient.  The patient concurred with the proposed plan, giving informed consent.  The site of surgery properly noted/marked. The patient was taken to Operating Room # 9, identified as Marga MelnickLashonda M Freet and the procedure verified as C-Section Delivery. A Time Out was held and the above information confirmed.  After induction of anesthesia, the patient was draped and prepped in the usual sterile manner. A Pfannenstiel incision was made and carried down through the subcutaneous tissue to the fascia. Fascial incision was made and extended transversely. The fascia was separated from the underlying rectus tissue superiorly and inferiorly. The peritoneum was identified and entered. Peritoneal incision was extended longitudinally. The utero-vesical peritoneal reflection was incised transversely and the bladder flap was bluntly freed from the lower uterine segment. A low transverse uterine incision was made. Delivered from cephalic presentation was a 1060 g  Female with Apgar scores of 5 at one minute and 7 at five minutes. After the umbilical cord was clamped and cut cord blood was obtained for evaluation. Cord pH was 7.09 The placenta was removed intact and appeared normal. The uterine outline, tubes and ovaries appeared normal. The uterine incision was closed with running locked sutures of 0  Vicryl. A second layer followedHemostasis was observed. Peritoneum was closed. The fascia was then reapproximated with running sutures of 0 Vicryl. The skin was reapproximated with Vicryl and 4-0.  Instrument, sponge, and needle counts were correct prior the abdominal closure and at the conclusion of the case.   Findings: As above  Estimated Blood Loss:  600 ml         Drains: Foley catheter         Total IV Fluids:  1500 ml         Specimens: Placenta to pathology         Complications:  None; patient tolerated the procedure well.         Disposition: PACU - hemodynamically stable.         Condition: stable  Attending Attestation: I was present and scrubbed for the entire procedure.   Adam PhenixJames G Shelbylynn Walczyk, MD 07/07/2016  2:36 AM

## 2016-07-07 NOTE — Anesthesia Postprocedure Evaluation (Signed)
Anesthesia Post Note  Patient: [name]  Procedure(s) Performed: Procedure(s) (LRB): CESAREAN SECTION (N/A)  Patient location during evaluation: [facility] Anesthesia Type: General Level of consciousness: awake and alert Pain management: pain level controlled Vital Signs Assessment: post-procedure vital signs reviewed and stable Respiratory status: spontaneous breathing, nonlabored ventilation, respiratory function stable and patient connected to nasal cannula oxygen Cardiovascular status: blood pressure returned to baseline and stable Postop Assessment: no signs of nausea or vomiting, patient able to bend at knees and no headache Anesthetic complications: no     Last Vitals:  Vitals:   07/07/16 1706 07/07/16 1721  BP:  128/82  Pulse: 70 70  Resp: 18 18  Temp:  36.3 C    Last Pain:  Vitals:   07/07/16 1721  TempSrc: Oral  PainSc:    Pain Goal: Patients Stated Pain Goal: 2 (07/05/16 0726)               Rica RecordsICKELTON,Charmane Protzman

## 2016-07-08 ENCOUNTER — Encounter (HOSPITAL_COMMUNITY): Payer: Self-pay | Admitting: Obstetrics & Gynecology

## 2016-07-08 LAB — TYPE AND SCREEN
ABO/RH(D): O POS
Antibody Screen: NEGATIVE

## 2016-07-08 LAB — RPR: RPR Ser Ql: NONREACTIVE

## 2016-07-08 MED ORDER — AMLODIPINE BESYLATE 5 MG PO TABS
5.0000 mg | ORAL_TABLET | Freq: Every day | ORAL | Status: DC
  Administered 2016-07-09: 5 mg via ORAL
  Filled 2016-07-08 (×2): qty 1

## 2016-07-08 MED ORDER — HYDRALAZINE HCL 20 MG/ML IJ SOLN: 10.0000 mg | Freq: Once | INTRAMUSCULAR | Status: DC | PRN

## 2016-07-08 MED ORDER — PNEUMOCOCCAL VAC POLYVALENT 25 MCG/0.5ML IJ INJ
0.5000 mL | INJECTION | INTRAMUSCULAR | Status: DC
  Filled 2016-07-08: qty 0.5

## 2016-07-08 MED ORDER — PNEUMOCOCCAL VAC POLYVALENT 25 MCG/0.5ML IJ INJ
0.5000 mL | INJECTION | INTRAMUSCULAR | Status: AC
Start: 2016-07-09 — End: 2016-07-09
  Administered 2016-07-09: 0.5 mL via INTRAMUSCULAR
  Filled 2016-07-08: qty 0.5

## 2016-07-08 MED ORDER — LABETALOL HCL 5 MG/ML IV SOLN
20.0000 mg | INTRAVENOUS | Status: DC | PRN
  Filled 2016-07-08: qty 4

## 2016-07-08 NOTE — Progress Notes (Signed)
NT reported a BP of 153/121 reevaluated the pts BP with a result of 165/105 administered medication that was due at 2200 and plan to recheck in 30 mins.

## 2016-07-08 NOTE — Progress Notes (Signed)
Telephoned Dr. Debroah LoopArnold to report BP of 160/76 in right arm and 181/117 in left arm.  Patient was on scheduled labetalol 800 mg and the last dose was 0647 on 07/07/16.  Orders forthcoming.   This nurse assumed care of patient at 1500, this date.

## 2016-07-08 NOTE — Progress Notes (Signed)
Subjective: Postpartum Day 1: Cesarean Delivery Patient reports incisional pain and tolerating PO.    Objective: Vital signs in last 24 hours: Temp:  [97.4 F (36.3 C)-98.2 F (36.8 C)] 97.7 F (36.5 C) (09/04 0200) Pulse Rate:  [63-77] 74 (09/04 0200) Resp:  [15-19] 18 (09/04 0200) BP: (119-154)/(79-110) 138/85 (09/04 0200) SpO2:  [96 %-99 %] 99 % (09/04 0200)  Physical Exam:  General: alert, cooperative, appears stated age and no distress Lochia: appropriate Uterine Fundus: firm Incision: no significant drainage, no dehiscence DVT Evaluation: No evidence of DVT seen on physical exam. Negative Homan's sign.   Recent Labs  07/06/16 1735 07/07/16 0323  HGB 11.1* 10.8*  HCT 32.9* 31.9*    Assessment/Plan: Status post Cesarean section. Doing well postoperatively.  Continue current care.  Vanessa Wise 07/08/2016, 5:55 AM

## 2016-07-08 NOTE — Progress Notes (Signed)
  Notified Dr. Debroah LoopArnold of corrected blood pressure due to CUFF size.

## 2016-07-08 NOTE — Clinical Social Work Maternal (Signed)
  CLINICAL SOCIAL WORK MATERNAL/CHILD NOTE  Patient Details  Name: Vanessa Wise MRN: [id number] Date of Birth: 04/21/1986  Date:  07/08/2016  Clinical Social Worker Initiating Note:  Laurey Arrow Date/ Time Initiated:  07/08/16/1140     Child's Name:  Herbie Baltimore Devaughn   Legal Guardian:  Mother   Need for Interpreter:  None   Date of Referral:  07/08/16     Reason for Referral:  Other (Comment) (NICU admissions)   Referral Source:  NICU   Address:  [address]. [place] [address]  Phone number:  [phone]   Household Members:  Minor Children, Significant Other   Natural Supports (not living in the home):  Immediate Family, Spouse/significant other, Parent   Professional Supports: None   Employment: Unemployed   Type of Work:     Education:  9 to 11 years   Museum/gallery curator Resources:  Medicaid   Other Resources:  Theatre stage manager Considerations Which May Impact Care:  None Reported  Strengths:  Other (Comment) (resourceful)   Risk Factors/Current Problems:  Air traffic controller , Estate manager/land agent:  Able to Concentrate , Alert , Insightful    Mood/Affect:  Bright , Happy , Interested , Comfortable    CSW Assessment: CSW met with NICU to assess for barriers, concerns, and needs that family may have while infant is in the NICU and to complete an assesmnt for hx of anxiety and depression. FOB Leafy Kindle) was present when CSW arrived and MOB gave CSW permission to meet with MOB while FOB was visiting. MOB was polite, inviting, and interested with meeting with CSW.  CSW inquired about MOB's mental health hx and MOB denied a hx of anxiety and depression. MOB reported refusing to take medicaiton during admission because MOB has never been dx with any menial health conditions. CSW inquired about MOB needs and barriers and MOB informed CSW that at this time MOB and family (3 minor children) did not have beds to sleep in.  CSW  provided MOB with the [employer] (furiniture referral) contact information and encouraged MOB to call them as soon as possible.  MOB reported that prior to delivery, MOB was in the process of selling her vehicle in effort to purchase a car seat and bed for infant.  MOB communicated that once MOB is discharged, it is till MOB's plain to sell her car.  CSW will follow up iwht MOB in the near future to see if MOB will be in need of assistant for a car seat and a safe place for infant to sleep.  CSW reviewed NICU visitation with MOB and MOB reported that MOB will have milinted to no transportation to visit with infant after MOB's discharge.  MOB communicated [place] and CSW provided MOB with a 31 day bus pass.  FOB reported, that at this time he does not need one, because he has to go back to work.  CSW will check-in with MOB while infant is NICU to further assess for transportation needs.   CSW Plan/Description:  Information/Referral to [employer] , [employer] , No Further Intervention Required/No Barriers to Discharge, Psychosocial Support and Ongoing Assessment of Needs   Laurey Arrow, MSW, LCSW Clinical Social Work [phone]    Dimple Nanas, LCSW 07/08/2016, 11:46 AM

## 2016-07-08 NOTE — Lactation Note (Signed)
This note was copied from a baby's chart. Lactation Consultation Note  Patient Name: Boy Alfredia ClientLashonda Stuller JYNWG'NToday's Date: 07/08/2016   Mom states she is pumping every 2 hours and obtaining a few mls of colostrum.  She has a DEBP for home use.  No concerns at present.  Encouraged to call with questions or assist prn.   Maternal Data    Feeding    LATCH Score/Interventions                      Lactation Tools Discussed/Used     Consult Status      Huston FoleyMOULDEN, Charley Lafrance S 07/08/2016, 10:57 AM

## 2016-07-09 ENCOUNTER — Ambulatory Visit: Payer: Self-pay

## 2016-07-09 LAB — COMPREHENSIVE METABOLIC PANEL
ALBUMIN: 1.9 g/dL — AB (ref 3.5–5.0)
ALK PHOS: 83 U/L (ref 38–126)
ALT: 36 U/L (ref 14–54)
ANION GAP: 3 — AB (ref 5–15)
AST: 33 U/L (ref 15–41)
BILIRUBIN TOTAL: 0.4 mg/dL (ref 0.3–1.2)
BUN: 15 mg/dL (ref 6–20)
CHLORIDE: 105 mmol/L (ref 101–111)
CO2: 25 mmol/L (ref 22–32)
Calcium: 7.4 mg/dL — ABNORMAL LOW (ref 8.9–10.3)
Creatinine, Ser: 1.03 mg/dL — ABNORMAL HIGH (ref 0.44–1.00)
GFR calc non Af Amer: >60 mL/min (ref >60)
Glucose, Bld: 76 mg/dL (ref 65–99)
POTASSIUM: 4.2 mmol/L (ref 3.5–5.1)
Sodium: 133 mmol/L — ABNORMAL LOW (ref 135–145)
Total Protein: 5 g/dL — ABNORMAL LOW (ref 6.5–8.1)

## 2016-07-09 LAB — CBC
HEMATOCRIT: 31.1 % — AB (ref 36.0–46.0)
HEMOGLOBIN: 10.7 g/dL — AB (ref 12.0–15.0)
MCH: 29.2 pg (ref 26.0–34.0)
MCHC: 34.4 g/dL (ref 30.0–36.0)
MCV: 85 fL (ref 78.0–100.0)
Platelets: 131 10*3/uL — ABNORMAL LOW (ref 150–400)
RBC: 3.66 MIL/uL — ABNORMAL LOW (ref 3.87–5.11)
RDW: 13.5 % (ref 11.5–15.5)
WBC: 15.5 10*3/uL — ABNORMAL HIGH (ref 4.0–10.5)

## 2016-07-09 MED ORDER — OXYCODONE-ACETAMINOPHEN 5-325 MG PO TABS: 1.0000 | ORAL_TABLET | Freq: Four times a day (QID) | ORAL | 0 refills | Status: DC | PRN

## 2016-07-09 MED ORDER — HYDRALAZINE HCL 50 MG PO TABS
50.0000 mg | ORAL_TABLET | Freq: Three times a day (TID) | ORAL | 1 refills | Status: DC
Start: 2016-07-09 — End: 2020-12-19

## 2016-07-09 MED ORDER — AMLODIPINE BESYLATE 5 MG PO TABS: 5.0000 mg | ORAL_TABLET | Freq: Every day | ORAL | 1 refills | Status: DC

## 2016-07-09 NOTE — Discharge Instructions (Signed)
Hypertension During Pregnancy °Hypertension, or high blood pressure, is when there is extra pressure inside your blood vessels that carry blood from the heart to the rest of your body (arteries). It can happen at any time in life, including pregnancy. Hypertension during pregnancy can cause problems for you and your baby. Your baby might not weigh as much as he or she should at birth or might be born early (premature). Very bad cases of hypertension during pregnancy can be life-threatening.  °Different types of hypertension can occur during pregnancy. These include: °· Chronic hypertension. This happens when a woman has hypertension before pregnancy and it continues during pregnancy. °· Gestational hypertension. This is when hypertension develops during pregnancy. °· Preeclampsia or toxemia of pregnancy. This is a very serious type of hypertension that develops only during pregnancy. It affects the whole body and can be very dangerous for both mother and baby.   °Gestational hypertension and preeclampsia usually go away after your baby is born. Your blood pressure will likely stabilize within 6 weeks. Women who have hypertension during pregnancy have a greater chance of developing hypertension later in life or with future pregnancies. °RISK FACTORS °There are certain factors that make it more likely for you to develop hypertension during pregnancy. These include: °· Having hypertension before pregnancy. °· Having hypertension during a previous pregnancy. °· Being overweight. °· Being older than 40 years. °· Being pregnant with more than one baby. °· Having diabetes or kidney problems. °SIGNS AND SYMPTOMS °Chronic and gestational hypertension rarely cause symptoms. Preeclampsia has symptoms, which may include: °· Increased protein in your urine. Your health care provider will check for this at every prenatal visit. °· Swelling of your hands and face. °· Rapid weight gain. °· Headaches. °· Visual changes. °· Being  bothered by light. °· Abdominal pain, especially in the upper right area. °· Chest pain. °· Shortness of breath. °· Increased reflexes. °· Seizures. These occur with a more severe form of preeclampsia, called eclampsia. °DIAGNOSIS  °You may be diagnosed with hypertension during a regular prenatal exam. At each prenatal visit, you may have: °· Your blood pressure checked. °· A urine test to check for protein in your urine. °The type of hypertension you are diagnosed with depends on when you developed it. It also depends on your specific blood pressure reading. °· Developing hypertension before 20 weeks of pregnancy is consistent with chronic hypertension. °· Developing hypertension after 20 weeks of pregnancy is consistent with gestational hypertension. °· Hypertension with increased urinary protein is diagnosed as preeclampsia. °· Blood pressure measurements that stay above 160 systolic or 110 diastolic are a sign of severe preeclampsia. °TREATMENT °Treatment for hypertension during pregnancy varies. Treatment depends on the type of hypertension and how serious it is. °· If you take medicine for chronic hypertension, you may need to switch medicines. °¨ Medicines called ACE inhibitors should not be taken during pregnancy. °¨ Low-dose aspirin may be suggested for women who have risk factors for preeclampsia. °· If you have gestational hypertension, you may need to take a blood pressure medicine that is safe during pregnancy. Your health care provider will recommend the correct medicine. °· If you have severe preeclampsia, you may need to be in the hospital. Health care providers will watch you and your baby very closely. You also may need to take medicine called magnesium sulfate to prevent seizures and lower blood pressure. °· Sometimes, an early delivery is needed. This may be the case if the condition worsens. It would be   done to protect you and your baby. The only cure for preeclampsia is delivery. °· Your health  care provider may recommend that you take one low-dose aspirin (81 mg) each day to help prevent high blood pressure during your pregnancy if you are at risk for preeclampsia. You may be at risk for preeclampsia if: °¨ You had preeclampsia or eclampsia during a previous pregnancy. °¨ Your baby did not grow as expected during a previous pregnancy. °¨ You experienced preterm birth with a previous pregnancy. °¨ You experienced a separation of the placenta from the uterus (placental abruption) during a previous pregnancy. °¨ You experienced the loss of your baby during a previous pregnancy. °¨ You are pregnant with more than one baby. °¨ You have other medical conditions, such as diabetes or an autoimmune disease. °HOME CARE INSTRUCTIONS °· Schedule and keep all of your regular prenatal care appointments. This is important. °· Take medicines only as directed by your health care provider. Tell your health care provider about all medicines you take. °· Eat as little salt as possible. °· Get regular exercise. °· Do not drink alcohol. °· Do not use tobacco products. °· Do not drink products with caffeine. °· Lie on your left side when resting. °SEEK IMMEDIATE MEDICAL CARE IF: °· You have severe abdominal pain. °· You have sudden swelling in your hands, ankles, or face. °· You gain 4 pounds (1.8 kg) or more in 1 week. °· You vomit repeatedly. °· You have vaginal bleeding. °· You do not feel your baby moving as much. °· You have a headache. °· You have blurred or double vision. °· You have muscle twitching or spasms. °· You have shortness of breath. °· You have blue fingernails or lips. °· You have blood in your urine. °MAKE SURE YOU: °· Understand these instructions. °· Will watch your condition. °· Will get help right away if you are not doing well or get worse. °  °This information is not intended to replace advice given to you by your health care provider. Make sure you discuss any questions you have with your health care  provider. °  °Document Released: 07/09/2011 Document Revised: 11/11/2014 Document Reviewed: 05/20/2013 °Elsevier Interactive Patient Education ©2016 [employer] °Postpartum Care After Cesarean Delivery °After you deliver your newborn (postpartum period), the usual stay in the hospital is 24-72 hours. If there were problems with your labor or delivery, or if you have other medical problems, you might be in the hospital longer.  °While you are in the hospital, you will receive help and instructions on how to care for yourself and your newborn during the postpartum period.  °While you are in the hospital: °· It is normal for you to have pain or discomfort from the incision in your abdomen. Be sure to tell your nurses when you are having pain, where the pain is located, and what makes the pain worse. °· If you are breastfeeding, you may feel uncomfortable contractions of your uterus for a couple of weeks. This is normal. The contractions help your uterus get back to normal size. °· It is normal to have some bleeding after delivery. °· For the first 1-3 days after delivery, the flow is red and the amount may be similar to a period. °· It is common for the flow to start and stop. °· In the first few days, you may pass some small clots. Let your nurses know if you begin to pass large clots or your flow increases. °· Do   not  flush blood clots down the toilet before having the nurse look at them. °· During the next 3-10 days after delivery, your flow should become more watery and pink or brown-tinged in color. °· Ten to fourteen days after delivery, your flow should be a small amount of yellowish-white discharge. °· The amount of your flow will decrease over the first few weeks after delivery. Your flow may stop in 6-8 weeks. Most women have had their flow stop by 12 weeks after delivery. °· You should change your sanitary pads frequently. °· Wash your hands thoroughly with soap and water for at least 20 seconds after  changing pads, using the toilet, or before holding or feeding your newborn. °· Your intravenous (IV) tubing will be removed when you are drinking enough fluids. °· The urine drainage tube (urinary catheter) that was inserted before delivery may be removed within 6-8 hours after delivery or when feeling returns to your legs. You should feel like you need to empty your bladder within the first 6-8 hours after the catheter has been removed. °· In case you become weak, lightheaded, or faint, call your nurse before you get out of bed for the first time and before you take a shower for the first time. °· Within the first few days after delivery, your breasts may begin to feel tender and full. This is called engorgement. Breast tenderness usually goes away within 48-72 hours after engorgement occurs. You may also notice milk leaking from your breasts. If you are not breastfeeding, do not stimulate your breasts. Breast stimulation can make your breasts produce more milk. °· Spending as much time as possible with your newborn is very important. During this time, you and your newborn can feel close and get to know each other. Having your newborn stay in your room (rooming in) will help to strengthen the bond with your newborn. It will give you time to get to know your newborn and become comfortable caring for your newborn. °· Your hormones change after delivery. Sometimes the hormone changes can temporarily cause you to feel sad or tearful. These feelings should not last more than a few days. If these feelings last longer than that, you should talk to your caregiver. °· If desired, talk to your caregiver about methods of family planning or contraception. °· Talk to your caregiver about immunizations. Your caregiver may want you to have the following immunizations before leaving the hospital: °· Tetanus, diphtheria, and pertussis (Tdap) or tetanus and diphtheria (Td) immunization. It is very important that you and your family  (including grandparents) or others caring for your newborn are up-to-date with the Tdap or Td immunizations. The Tdap or Td immunization can help protect your newborn from getting ill. °· Rubella immunization. °· Varicella (chickenpox) immunization. °· Influenza immunization. You should receive this annual immunization if you did not receive the immunization during your pregnancy. °  °This information is not intended to replace advice given to you by your health care provider. Make sure you discuss any questions you have with your health care provider. °  °Document Released: 07/15/2012 Document Reviewed: 07/15/2012 °Elsevier Interactive Patient Education ©2016 [employer] Inc. ° °

## 2016-07-09 NOTE — Progress Notes (Signed)
Pt d/c hand pump given to pt  Until rental can be obtained   Pt will be back this afternoon  Teaching complete

## 2016-07-09 NOTE — Discharge Summary (Signed)
OB Discharge Summary     Patient Name: ARONDA BURFORD DOB: 1986/02/22 MRN: [id number]  Date of admission: 06/26/2016 Delivering MD: Adam Phenix   Date of discharge: 07/09/2016  Admitting diagnosis: 27WKS, SWOLLEN FEET AND FACE,DIARRHEA,VOMITING Intrauterine pregnancy: [redacted]w[redacted]d     Secondary diagnosis:  Principal Problem:   Preeclampsia Active Problems:   [redacted] weeks gestation of pregnancy  Additional problems: Delivered 28w 4 days     Discharge diagnosis: Preterm Pregnancy Delivered and Preeclampsia (severe)                                                                                                Post partum procedures:none  Augmentation: Cytotec  Complications: None  Hospital course:  Induction of Labor With Cesarean Section  30 y.o. yo [phone] at [redacted]w[redacted]d was admitted to the hospital 06/26/2016 for severe preeclampsia.She was hospitalized on anntenatal for BP control. When BP was not managed on 3 agents and variable decels were noted IOL was started but FHR was not reassuring . The patient went for cesarean section due to Non-Reassuring FHR, and delivered a Viable infant,@BABYSUPPRESS (DBLINK,ept,110,,1,,) Membrane Rupture Time/Date: )  ,    @Details  of operation can be found in separate operative Note.  Patient had an uncomplicated postpartum course. She is ambulating, tolerating a regular diet, passing flatus, and urinating well.  Patient is discharged home in stable condition on 07/09/16.                                     Physical exam Vitals:   07/08/16 2244 07/08/16 2256 07/09/16 0145 07/09/16 0615  BP: (!) 163/102 (!) 152/92 136/88 (!) 140/93  Pulse:   89 89  Resp:   18 18  Temp:   98.2 F (36.8 C) 99.4 F (37.4 C)  TempSrc:   Oral Oral  SpO2:   100% 100%  Weight:      Height:       General: alert, cooperative and no distress Lochia: appropriate Uterine Fundus: firm Incision: Healing well with no significant drainage DVT Evaluation: No evidence of DVT seen  on physical exam. Labs: Lab Results  Component Value Date   WBC 15.5 (H) 07/09/2016   HGB 10.7 (L) 07/09/2016   HCT 31.1 (L) 07/09/2016   MCV 85.0 07/09/2016   PLT 131 (L) 07/09/2016   CMP Latest Ref Rng & Units 07/09/2016  Glucose 65 - 99 mg/dL 76  BUN 6 - 20 mg/dL 15  Creatinine 1.47 - 8.29 mg/dL 5.62(Z)  Sodium 308 - 657 mmol/L 133(L)  Potassium 3.5 - 5.1 mmol/L 4.2  Chloride 101 - 111 mmol/L 105  CO2 22 - 32 mmol/L 25  Calcium 8.9 - 10.3 mg/dL 7.4(L)  Total Protein 6.5 - 8.1 g/dL 5.0(L)  Total Bilirubin 0.3 - 1.2 mg/dL 0.4  Alkaline Phos 38 - 126 U/L 83  AST 15 - 41 U/L 33  ALT 14 - 54 U/L 36    Discharge instruction: per After Visit Summary and "Baby and Me Booklet".  After visit  meds:    Medication List    STOP taking these medications   acetaminophen 500 MG tablet Commonly known as:  TYLENOL     TAKE these medications   amLODipine 5 MG tablet Commonly known as:  NORVASC Take 1 tablet (5 mg total) by mouth daily.   buPROPion 150 MG 12 hr tablet Commonly known as:  WELLBUTRIN SR Take 1 tablet (150 mg total) by mouth 2 (two) times daily.   hydrALAZINE 50 MG tablet Commonly known as:  APRESOLINE Take 1 tablet (50 mg total) by mouth every 8 (eight) hours.   oxyCODONE-acetaminophen 5-325 MG tablet Commonly known as:  PERCOCET/ROXICET Take 1-2 tablets by mouth every 6 (six) hours as needed.   PRENATE PIXIE 10-0.6-0.4-200 MG Caps Take 1 tablet by mouth daily.       Diet: routine diet  Activity: Advance as tolerated. Pelvic rest for 6 weeks.   Outpatient follow up:1 week for BP check Follow up Appt:No future appointments. Follow up Visit:No Follow-up on file.  Postpartum contraception: Undecided  Newborn Data: Live born female  Birth Weight: 2 lb 5.4 oz (1060 g) APGAR: 5, 7  Baby Feeding: pumping Disposition:NICU   07/09/2016 Scheryl DarterARNOLD,Luanna Weesner, MD

## 2016-07-09 NOTE — Lactation Note (Signed)
This note was copied from a baby's chart. Lactation Consultation Note  Patient Name: Vanessa Wise ClientLashonda Guisinger ONGEX'BToday's Date: 07/09/2016 Reason for consult: Follow-up assessment     On this mom of a NICU baby, now 2463 hours old, and 28 6/7 weeks CGA. I was called by Florentina AddisonKatie, chaplain, that mom was concerned that she was not expressing any milk,. I told Katie to tell mom I would come to see her ina bout 15 minutes. Mom was gone when I got there. She is a MJ user, and smokes a pack of cigarettes a day. This will cause decrease in milk production. I will leave a not for tomorrow's LC to talk to mom.    Maternal Data    Feeding    LATCH Score/Interventions                      Lactation Tools Discussed/Used     Consult Status Consult Status: PRN Follow-up type: In-patient (NICU)    Alfred LevinsLee, Marvyn Torrez Anne 07/09/2016, 5:12 PM

## 2016-07-11 ENCOUNTER — Other Ambulatory Visit: Payer: Self-pay | Admitting: Student

## 2016-07-11 MED ORDER — OXYCODONE-ACETAMINOPHEN 5-325 MG PO TABS: 1.0000 | ORAL_TABLET | Freq: Four times a day (QID) | ORAL | 0 refills | Status: DC | PRN

## 2016-07-18 ENCOUNTER — Encounter: Payer: Self-pay | Admitting: *Deleted

## 2016-07-18 LAB — PROCEDURE REPORT - SCANNED: Pap: NEGATIVE

## 2016-07-18 NOTE — Addendum Note (Signed)
Addendum  created 07/18/16 0932 by Sherrian DiversBruce Wm Sahagun, MD   Anesthesia Event edited

## 2016-07-22 ENCOUNTER — Encounter: Payer: Self-pay | Admitting: Obstetrics

## 2016-07-22 ENCOUNTER — Ambulatory Visit (INDEPENDENT_AMBULATORY_CARE_PROVIDER_SITE_OTHER): Payer: Medicaid Other | Admitting: Obstetrics

## 2016-07-22 DIAGNOSIS — O1002 Pre-existing essential hypertension complicating childbirth: Secondary | ICD-10-CM

## 2016-07-22 NOTE — Progress Notes (Signed)
Subjective:     Vanessa MelnickLashonda M Wise is a 30 y.o. female who presents for a postpartum visit. She is 2 weeks postpartum following a low cervical transverse Cesarean section. I have fully reviewed the prenatal and intrapartum course. The delivery was at 28 gestational weeks for severe preeclampsia, refractory to medical therapy. Outcome: primary cesarean section, low transverse incision. Anesthesia: spinal. Postpartum course has been normal. Baby's course has been NICU. Baby is feeding by bottle. Bleeding thin lochia. Bowel function is normal. Bladder function is normal. Patient is not sexually active. Contraception method is tubal ligation. Postpartum depression screening: negative.  Tobacco, alcohol and substance abuse history reviewed.  Adult immunizations reviewed including TDAP, rubella and varicella.  The following portions of the patient's history were reviewed and updated as appropriate: allergies, current medications, past family history, past medical history, past social history, past surgical history and problem list.  Review of Systems A comprehensive review of systems was negative.   Objective:    BP (!) 152/103   Pulse 89   Temp 98.6 F (37 C) (Oral)   Ht 5\' 4"  (1.626 m)   Wt 150 lb 14.4 oz (68.4 kg)   LMP  (LMP Unknown)   Breastfeeding? Yes   BMI 25.90 kg/m    PE:      General:  Alert and no distress      Abdomen:  Soft, NT.  Incision C, D, I.      Extremities:  No C, C, E.   50% of 15 min visit spent on counseling and coordination of care.    Assessment:    2 weeks postpartum.    Preeclampsia, resolving  Plan:    1. Contraception: tubal ligation 2. Continue BP meds 3. Follow up in: 4 weeks or as needed.   Healthy lifestyle practices reviewed

## 2016-07-30 ENCOUNTER — Telehealth: Payer: Self-pay | Admitting: Obstetrics

## 2016-07-30 NOTE — Telephone Encounter (Signed)
Called pt and spoke family member about referral to PCP provider, patient will call back

## 2016-08-12 ENCOUNTER — Ambulatory Visit: Payer: Self-pay

## 2016-08-12 NOTE — Lactation Note (Signed)
This note was copied from a baby's chart. Lactation Consultation Note  Patient Name: Vanessa Wise UJWJX'BToday's Date: 08/12/2016 Reason for consult: Follow-up assessment;NICU baby;Infant < 6lbs   Follow up with mom of 335 week old in NICU. Mom reports that her breastmilk supply is decreasing. She reports she is pumping every 3 hours during the day and is sleeping at night. Mom reports she is aware that she should be pumping at night and is finding it hard to wake up.Vanessa Wise. Enc mom to pump at least 8 x a day with a 6 hour stretch at night. Enc her to power pump just before bed. She has purchased lactation cookies from [employer] and reports that her milk has increased some in the last few days. Mom is covering pump with infant's blanket, pumping in car as needed and pumping when visiting in NICU. Infant is not being put to breast at this time. He is 33 w corrected GA.   [place] Handout given with instructions to call OB prior to taking. Discussed her looking into [employer] More Milk Plus also. Gave mom Lactation Cookies Recipe. Enc mom to call with further questions/concerns prn.    Maternal Data Does the patient have breastfeeding experience prior to this delivery?: No  Feeding Feeding Type: Donor Breast Milk Length of feed: 45 min  LATCH Score/Interventions                      Lactation Tools Discussed/Used Pump Review: Setup, frequency, and cleaning   Consult Status Consult Status: PRN Follow-up type: Call as needed    Ed BlalockSharon S Jo Cerone 08/12/2016, 2:49 PM

## 2016-08-19 ENCOUNTER — Ambulatory Visit: Payer: Self-pay | Admitting: Obstetrics and Gynecology

## 2017-01-09 IMAGING — US US TRANSVAGINAL NON-OB
1 series · 14 of 25 positions shown · non-contrast
Comparison: None

CLINICAL DATA: Pelvic pain, evaluate IUD placement (unable to
visualize IUD string)

EXAM:
TRANSABDOMINAL AND TRANSVAGINAL ULTRASOUND OF PELVIS
TECHNIQUE: Both transabdominal and transvaginal ultrasound examinations of the
pelvis were performed. Transabdominal technique was performed for
global imaging of the pelvis including uterus, ovaries, adnexal
regions, and pelvic cul-de-sac. It was necessary to proceed with
endovaginal exam following the transabdominal exam to visualize the
endometrium and bilateral ovaries.

[Series 1: us transvaginal non-ob · 14 of 88 slices shown]
[im 1/88]
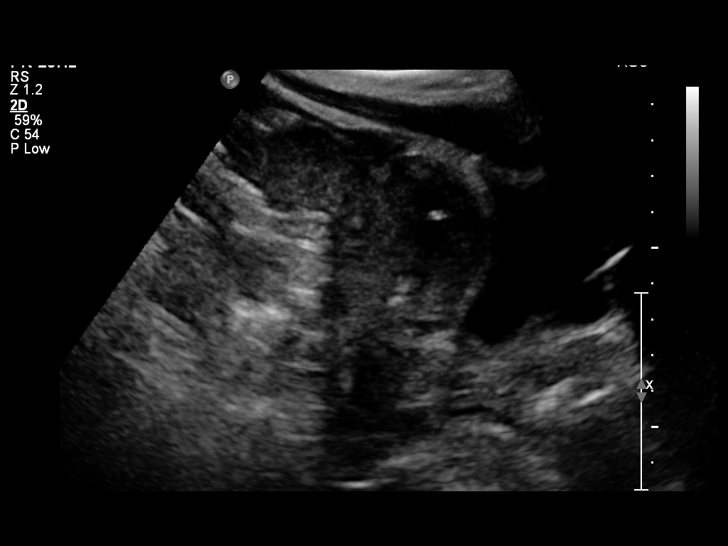
[im 8/88]
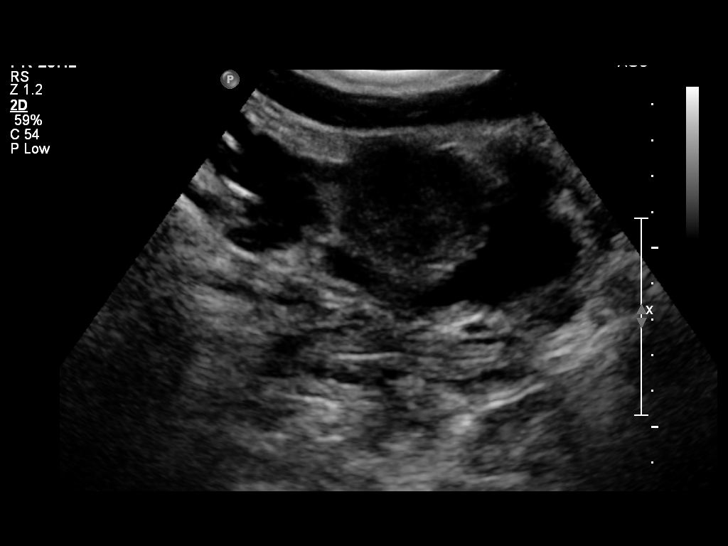
[im 15/88]
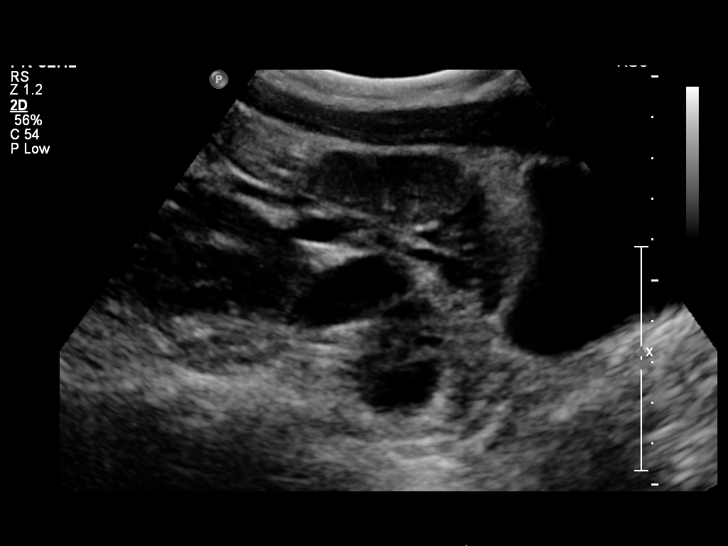
[im 22/88]
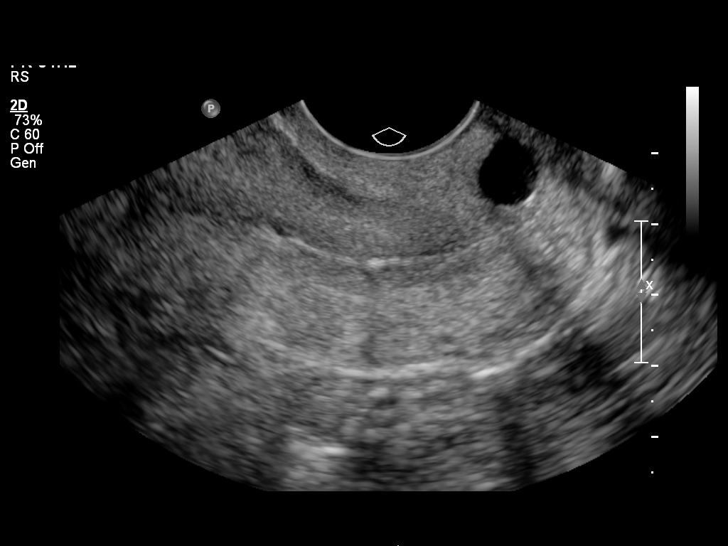
[im 30/88]
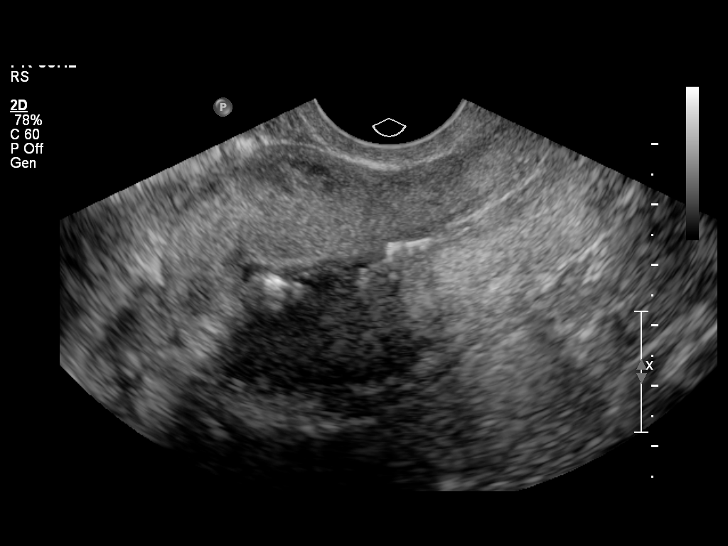
[im 33/88]
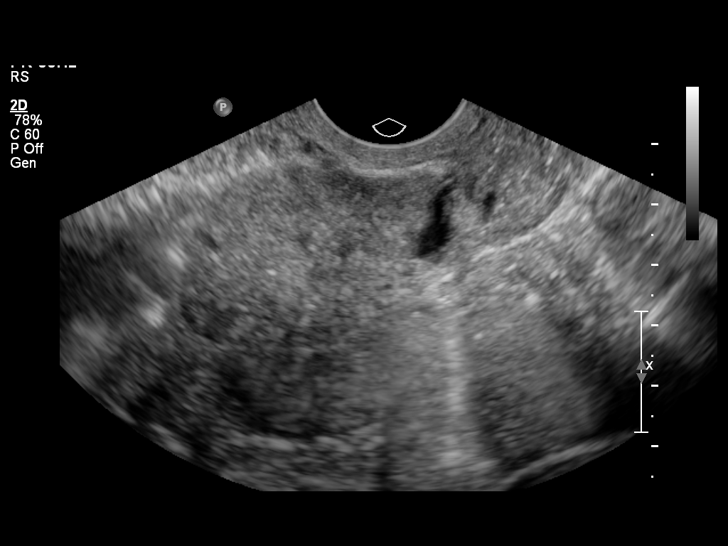
[im 40/88]
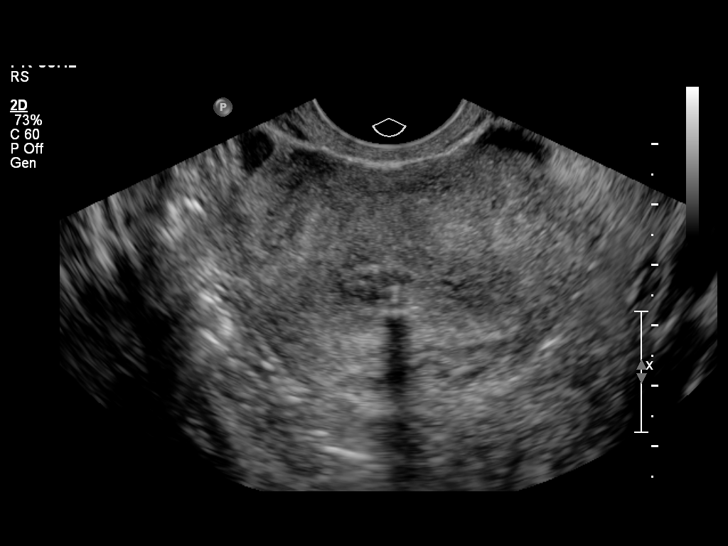
[im 48/88]
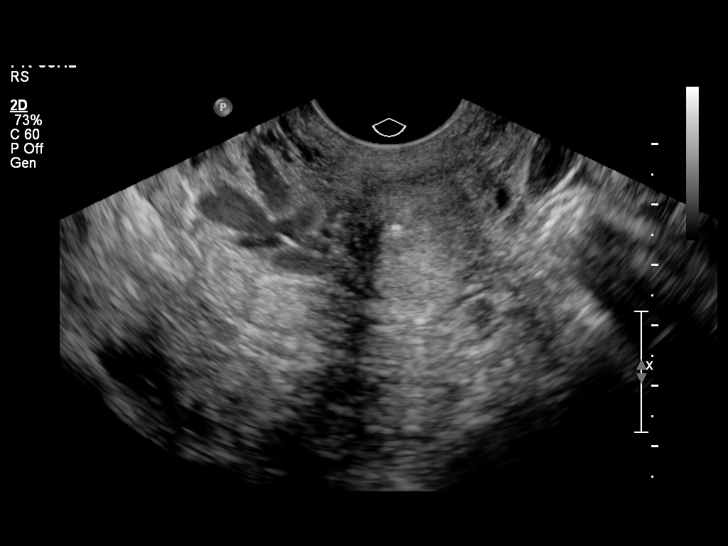
[im 55/88]
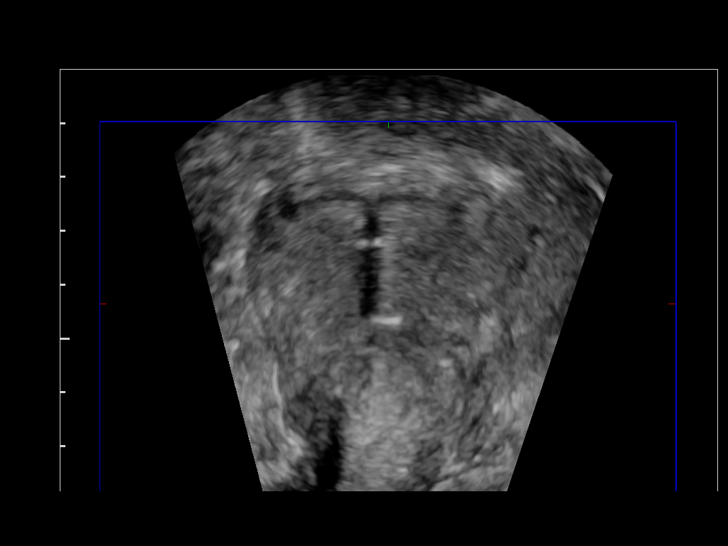
[im 59/88]
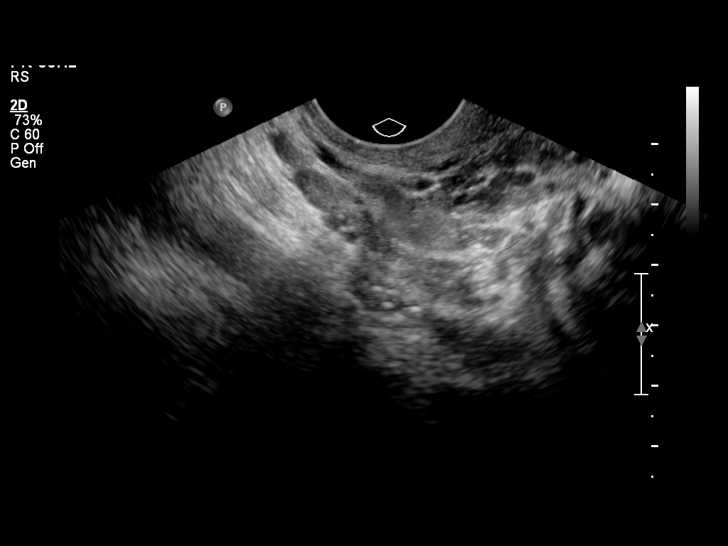
[im 66/88]
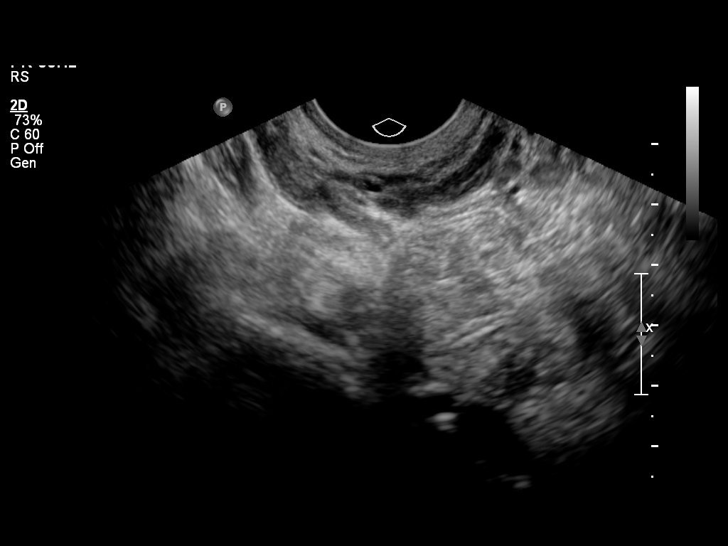
[im 73/88]
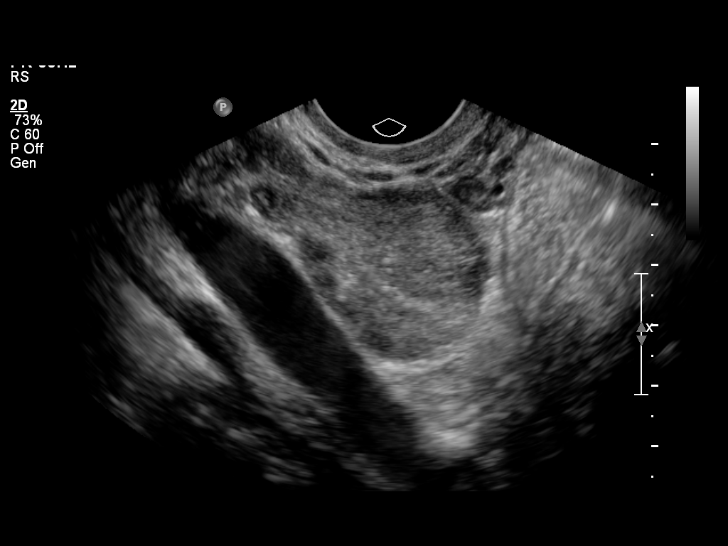
[im 80/88]
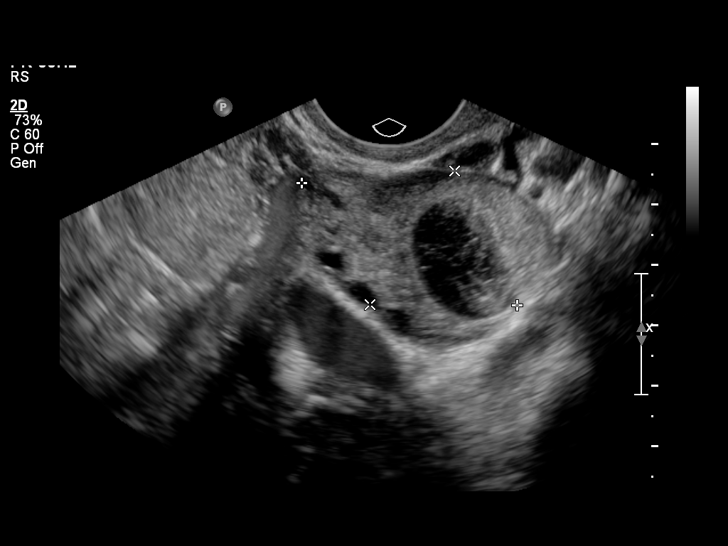
[im 88/88]
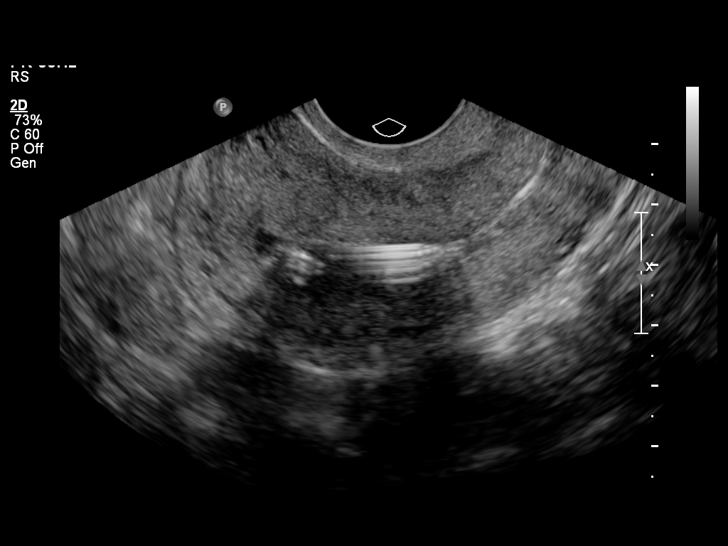

[14 of 25 positions shown; findings below may reference images not displayed]

FINDINGS: Uterus

Measurements: 8.6 x 4.4 x 5.7 cm. No fibroids or other mass
visualized.

Endometrium

Thickness: 6 mm.  IUD in satisfactory position.

Right ovary

Measurements: 2.5 x 1.4 x 2.4 cm. Normal appearance/no adnexal mass.

Left ovary

Measurements: 4.1 x 2.6 x 3.3 cm. 2.1 x 2.0 x 2.3 cm hemorrhagic
cyst with retracted clot.

Other findings

No free fluid.
IMPRESSION: IUD in satisfactory position.

2.3 cm hemorrhagic left ovarian cyst, physiologic.

## 2018-02-18 IMAGING — US US OB COMP LESS 14 WK
1 series · 15 of 28 positions shown · non-contrast
Comparison: No comparison studies available.

CLINICAL DATA: Abdominal pain with positive pregnancy test.

EXAM:
OBSTETRIC <14 WK US AND TRANSVAGINAL OB US
TECHNIQUE: Both transabdominal and transvaginal ultrasound examinations were
performed for complete evaluation of the gestation as well as the
maternal uterus, adnexal regions, and pelvic cul-de-sac.
Transvaginal technique was performed to assess early pregnancy.

[Series 1: us ob comp less 14 wk · 15 of 77 slices shown]
[im 1/77]
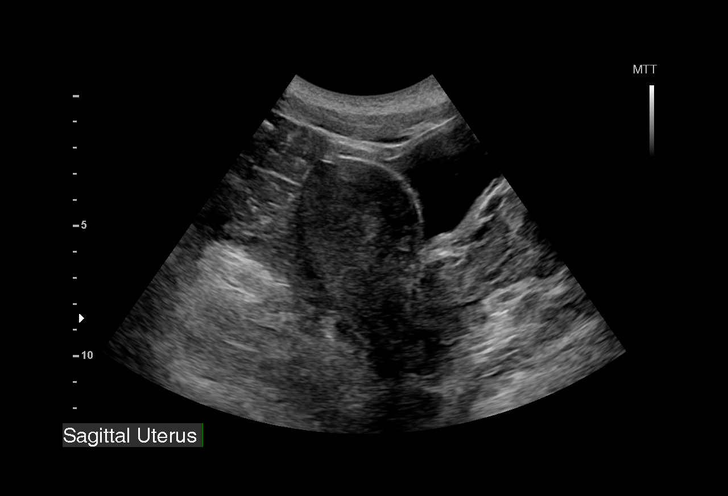
[im 6/77]
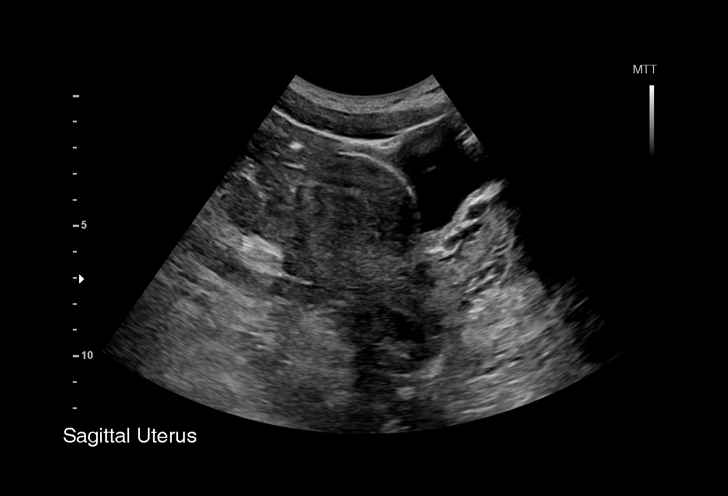
[im 12/77]
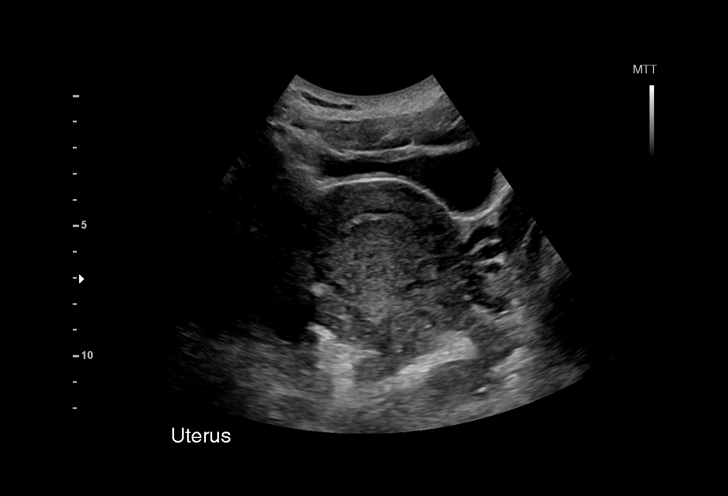
[im 17/77]
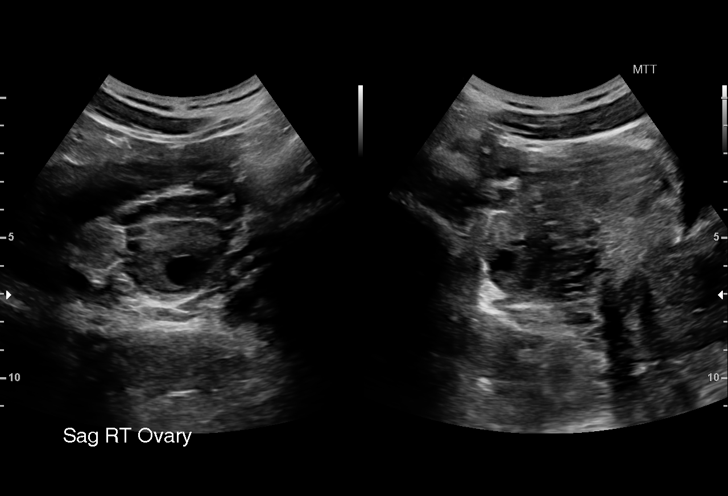
[im 23/77]
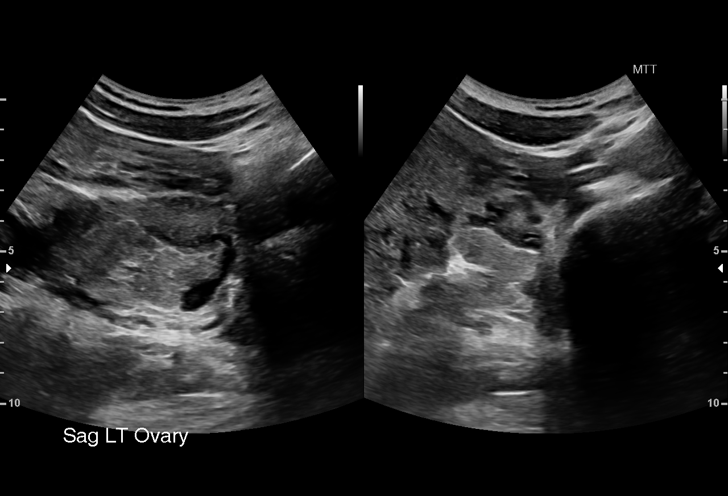
[im 29/77]
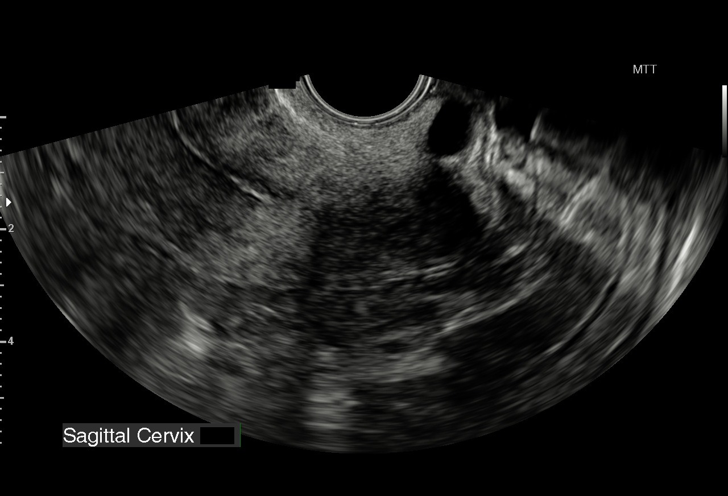
[im 34/77]
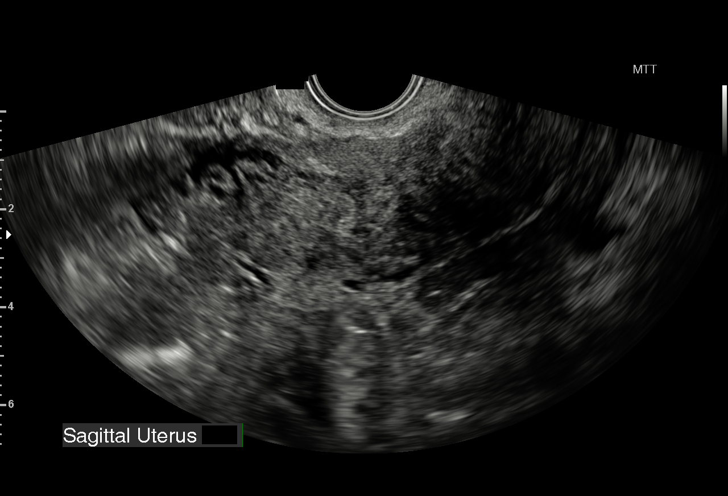
[im 40/77]
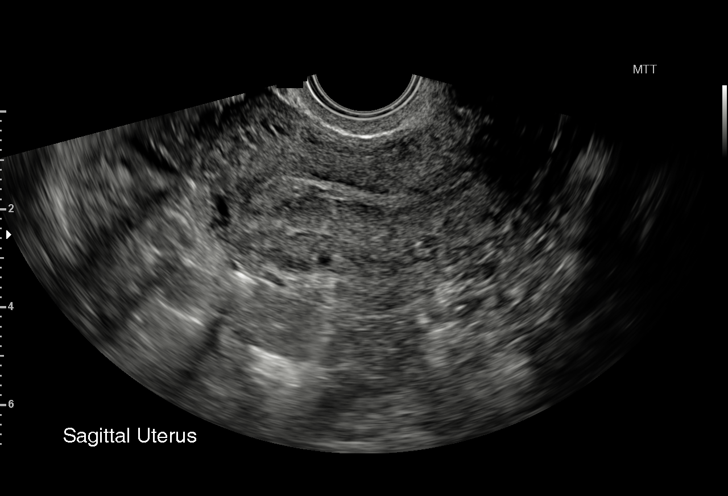
[im 43/77]
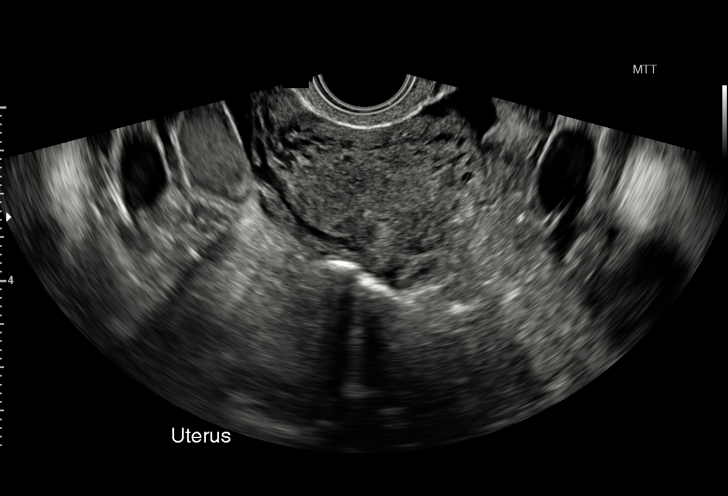
[im 48/77]
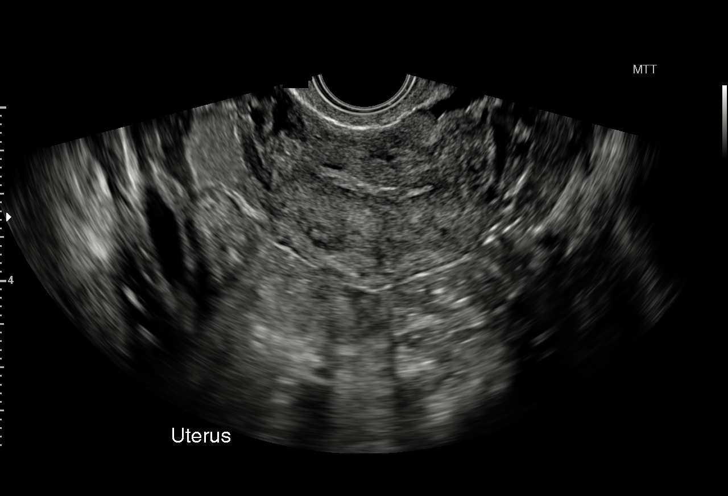
[im 54/77]
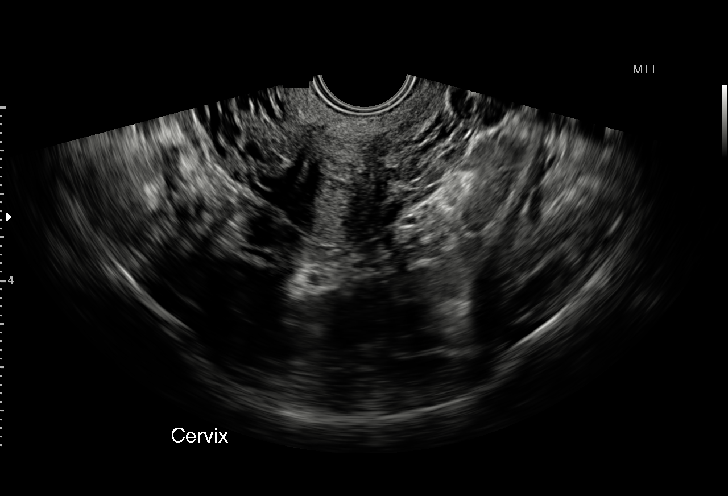
[im 60/77]
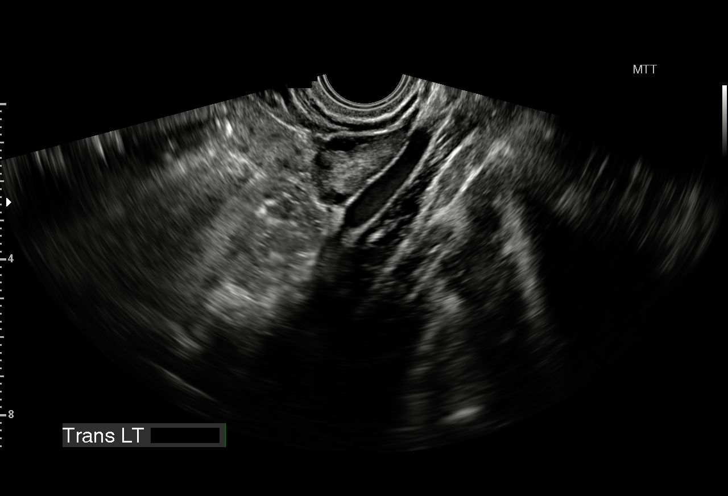
[im 65/77]
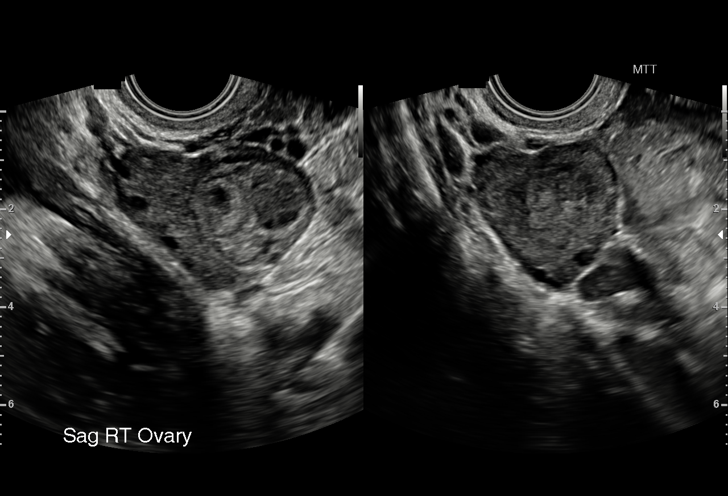
[im 71/77]
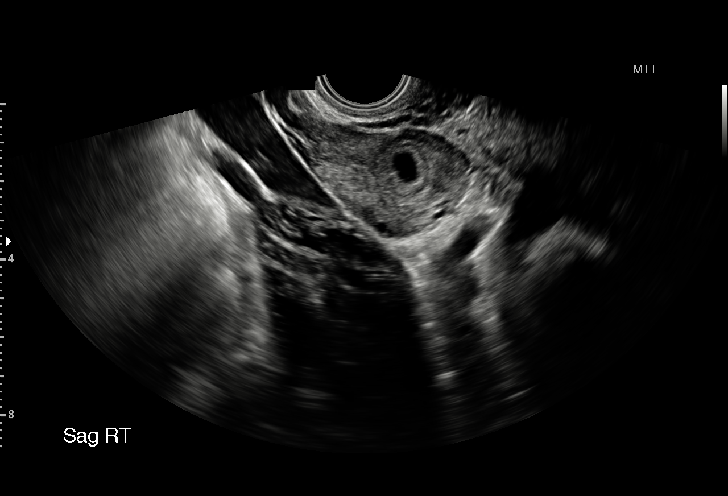
[im 77/77]
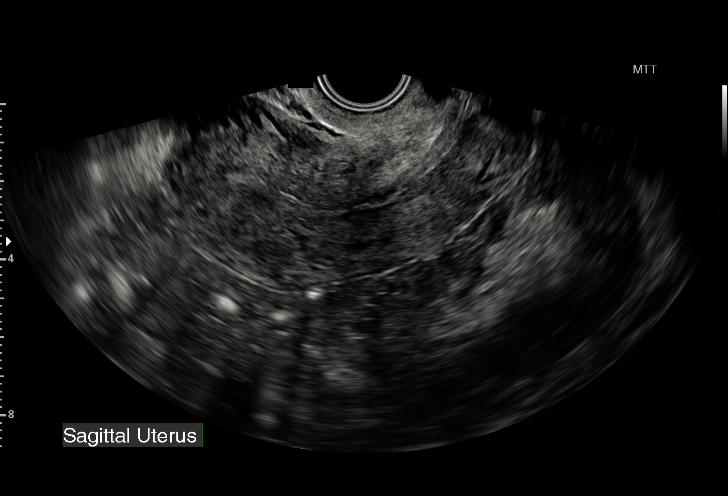

[15 of 28 positions shown; findings below may reference images not displayed]

FINDINGS: Intrauterine gestational sac: Not visualized.

Yolk sac:  Not visualized.

Embryo:  Not visualized.

Cardiac Activity: Not visualized.

Subchorionic hemorrhage:  None visualized.

Maternal uterus/adnexae: Endometrium is thin and homogeneous.
Maternal ovaries are normal in appearance with probable corpus
luteum cyst in the parenchyma of the right ovary.
IMPRESSION: No intrauterine gestation. Given the history of a positive pregnancy
test, differential considerations include intrauterine gestation too
early to visualize, completed abortion, or nonvisualized ectopic
pregnancy. Close clinical correlation is recommended with serial
beta-hCG and followup ultrasound as warranted.

## 2018-05-21 ENCOUNTER — Encounter (HOSPITAL_COMMUNITY): Payer: Self-pay

## 2018-05-21 ENCOUNTER — Emergency Department (HOSPITAL_COMMUNITY)
Admission: EM | Admit: 2018-05-21 | Discharge: 2018-05-21 | Disposition: A | Discharge status: Home / Self Care | Payer: Self-pay | Attending: Emergency Medicine | Admitting: Emergency Medicine

## 2018-05-21 ENCOUNTER — Other Ambulatory Visit: Payer: Self-pay

## 2018-05-21 DIAGNOSIS — W57XXXA Bitten or stung by nonvenomous insect and other nonvenomous arthropods, initial encounter: Secondary | ICD-10-CM | POA: Insufficient documentation

## 2018-05-21 DIAGNOSIS — S00461A Insect bite (nonvenomous) of right ear, initial encounter: Secondary | ICD-10-CM | POA: Insufficient documentation

## 2018-05-21 DIAGNOSIS — Y929 Unspecified place or not applicable: Secondary | ICD-10-CM | POA: Insufficient documentation

## 2018-05-21 DIAGNOSIS — Z5321 Procedure and treatment not carried out due to patient leaving prior to being seen by health care provider: Secondary | ICD-10-CM | POA: Insufficient documentation

## 2018-05-21 DIAGNOSIS — Y999 Unspecified external cause status: Secondary | ICD-10-CM | POA: Insufficient documentation

## 2018-05-21 DIAGNOSIS — Y939 Activity, unspecified: Secondary | ICD-10-CM | POA: Insufficient documentation

## 2018-05-21 NOTE — ED Notes (Signed)
Patient was called 2x to reassess vital signs with no answer.

## 2018-05-21 NOTE — ED Triage Notes (Signed)
Pt reports that she was bitten by something on the back of her R ear about an hour ago, no bite or swelling noted.

## 2020-12-19 ENCOUNTER — Inpatient Hospital Stay (HOSPITAL_BASED_OUTPATIENT_CLINIC_OR_DEPARTMENT_OTHER): Payer: Medicaid Other

## 2020-12-19 ENCOUNTER — Inpatient Hospital Stay (HOSPITAL_COMMUNITY)
Admission: AD | Admit: 2020-12-19 | Discharge: 2020-12-19 | Disposition: A | Discharge status: Home / Self Care | Payer: Medicaid Other | Attending: Obstetrics and Gynecology | Admitting: Obstetrics and Gynecology

## 2020-12-19 ENCOUNTER — Other Ambulatory Visit: Payer: Self-pay

## 2020-12-19 ENCOUNTER — Encounter (HOSPITAL_COMMUNITY): Payer: Self-pay | Admitting: Obstetrics and Gynecology

## 2020-12-19 DIAGNOSIS — O26893 Other specified pregnancy related conditions, third trimester: Secondary | ICD-10-CM | POA: Insufficient documentation

## 2020-12-19 DIAGNOSIS — O359XX0 Maternal care for (suspected) fetal abnormality and damage, unspecified, not applicable or unspecified: Secondary | ICD-10-CM

## 2020-12-19 DIAGNOSIS — Z3A29 29 weeks gestation of pregnancy: Secondary | ICD-10-CM

## 2020-12-19 DIAGNOSIS — O358XX0 Maternal care for other (suspected) fetal abnormality and damage, not applicable or unspecified: Secondary | ICD-10-CM

## 2020-12-19 DIAGNOSIS — O99891 Other specified diseases and conditions complicating pregnancy: Secondary | ICD-10-CM | POA: Diagnosis not present

## 2020-12-19 DIAGNOSIS — Q270 Congenital absence and hypoplasia of umbilical artery: Secondary | ICD-10-CM

## 2020-12-19 DIAGNOSIS — Z3687 Encounter for antenatal screening for uncertain dates: Secondary | ICD-10-CM | POA: Diagnosis not present

## 2020-12-19 DIAGNOSIS — O0930 Supervision of pregnancy with insufficient antenatal care, unspecified trimester: Secondary | ICD-10-CM

## 2020-12-19 DIAGNOSIS — F17210 Nicotine dependence, cigarettes, uncomplicated: Secondary | ICD-10-CM | POA: Insufficient documentation

## 2020-12-19 DIAGNOSIS — O99333 Smoking (tobacco) complicating pregnancy, third trimester: Secondary | ICD-10-CM | POA: Insufficient documentation

## 2020-12-19 DIAGNOSIS — O0933 Supervision of pregnancy with insufficient antenatal care, third trimester: Secondary | ICD-10-CM | POA: Diagnosis not present

## 2020-12-19 DIAGNOSIS — R42 Dizziness and giddiness: Secondary | ICD-10-CM | POA: Diagnosis not present

## 2020-12-19 HISTORY — DX: 28 weeks gestation of pregnancy: Z3A.28

## 2020-12-19 LAB — CBC
HCT: 29.5 % — ABNORMAL LOW (ref 36.0–46.0)
Hemoglobin: 10.3 g/dL — ABNORMAL LOW (ref 12.0–15.0)
MCH: 30.7 pg (ref 26.0–34.0)
MCHC: 34.9 g/dL (ref 30.0–36.0)
MCV: 88.1 fL (ref 80.0–100.0)
Platelets: 177 10*3/uL (ref 150–400)
RBC: 3.35 MIL/uL — ABNORMAL LOW (ref 3.87–5.11)
RDW: 13.4 % (ref 11.5–15.5)
WBC: 10.4 10*3/uL (ref 4.0–10.5)
nRBC: 0 % (ref 0.0–0.2)

## 2020-12-19 LAB — COMPREHENSIVE METABOLIC PANEL
ALT: 12 U/L (ref 0–44)
AST: 19 U/L (ref 15–41)
Albumin: 2.5 g/dL — ABNORMAL LOW (ref 3.5–5.0)
Alkaline Phosphatase: 97 U/L (ref 38–126)
Anion gap: 8 (ref 5–15)
BUN: 6 mg/dL (ref 6–20)
CO2: 22 mmol/L (ref 22–32)
Calcium: 8.5 mg/dL — ABNORMAL LOW (ref 8.9–10.3)
Chloride: 105 mmol/L (ref 98–111)
Creatinine, Ser: 0.54 mg/dL (ref 0.44–1.00)
GFR, Estimated: >60 mL/min (ref >60)
Glucose, Bld: 77 mg/dL (ref 70–99)
Potassium: 3.2 mmol/L — ABNORMAL LOW (ref 3.5–5.1)
Sodium: 135 mmol/L (ref 135–145)
Total Bilirubin: 0.4 mg/dL (ref 0.3–1.2)
Total Protein: 6.1 g/dL — ABNORMAL LOW (ref 6.5–8.1)

## 2020-12-19 LAB — PROTEIN / CREATININE RATIO, URINE
Creatinine, Urine: 355 mg/dL
Protein Creatinine Ratio: 0.11 mg/mg{Cre} (ref 0.00–0.15)
Total Protein, Urine: 39 mg/dL

## 2020-12-19 LAB — WET PREP, GENITAL
Sperm: NONE SEEN
Trich, Wet Prep: NONE SEEN
Yeast Wet Prep HPF POC: NONE SEEN

## 2020-12-19 LAB — POCT PREGNANCY, URINE: Preg Test, Ur: POSITIVE — AB

## 2020-12-19 MED ORDER — PROMETHAZINE HCL 12.5 MG PO TABS: 12.5000 mg | ORAL_TABLET | Freq: Four times a day (QID) | ORAL | 0 refills | Status: DC | PRN

## 2020-12-19 MED ORDER — ASPIRIN EC 81 MG PO TBEC: 81.0000 mg | DELAYED_RELEASE_TABLET | Freq: Every day | ORAL | 2 refills | Status: DC

## 2020-12-19 MED ORDER — ACETAMINOPHEN 500 MG PO TABS
1000.0000 mg | ORAL_TABLET | Freq: Once | ORAL | Status: AC
  Administered 2020-12-19: 1000 mg via ORAL
  Filled 2020-12-19: qty 2

## 2020-12-19 MED ORDER — VITAFOL GUMMIES 3.33-0.333-34.8 MG PO CHEW: 1.0000 | CHEWABLE_TABLET | Freq: Every day | ORAL | 5 refills | Status: DC

## 2020-12-19 NOTE — Discharge Instructions (Signed)

## 2020-12-19 NOTE — MAU Note (Signed)
Patient states she is having a difficult time finding an OB.  [facility]  LMP unknown.  States she had a TAB in Aug/Sept. And never had a period afterwards.  Came in today for abdominal pain that comes and goes and dizziness that doesn't feel like her previous pregnancies.  Denies any VB.

## 2020-12-19 NOTE — MAU Provider Note (Signed)
History     CSN: [id number]  Arrival date and time: 12/19/20 1131   Event Date/Time   First Provider Initiated Contact with Patient 12/19/20 1359      Chief Complaint  Patient presents with  . Dizziness   HPI Vanessa Wise is a 35 y.o. [id number] who presents to MAU with multiple concerns related to her pregnancy. She is s/p elective abortion around 07/03/2020. She states she was unable to attend the recommended follow-up. She is uncertain if she has a new pregnancy or if her abortion was unsuccessful. She endorses fetal movement. She denies abdominal pain, vaginal bleeding, dysuria, fever or recent illness.  Patient states she attempted to schedule care with [facility] but her insurance changes 01/02/2021 and she is unable to seen anywhere but the ED until that time. She states [facility] advised her to call back after 01/02/2021 to schedule evaluation.  OB History    Gravida  4   Para  3   Term  1   Preterm  2   AB      Living  3     SAB      IAB      Ectopic      Multiple  0   Live Births  1           Past Medical History:  Diagnosis Date  . Anxiety   . Chlamydia infection 02/03/2012  . IBS (irritable bowel syndrome)   . Migraines   . Pain due to intrauterine contraceptive device (IUD) (HCC) 01/17/2015   S/p hysteroscopic removal 01/17/2015   . Seasonal allergies     Past Surgical History:  Procedure Laterality Date  . CESAREAN SECTION N/A 07/07/2016   Procedure: CESAREAN SECTION;  Surgeon: Adam PhenixJames G Arnold, MD;  [facility];  Service: Obstetrics;  Laterality: N/A;  . HYSTEROSCOPY N/A 01/17/2015   Procedure: HYSTEROSCOPY WITH REMOVAL INTRAUTERINE DIVICE;  Surgeon: Willodean Rosenthalarolyn Harraway-Smith, MD;  Location: [facility];  Service: Gynecology;  Laterality: N/A;  . IUD REMOVAL    . MOUTH SURGERY    . NO PAST SURGERIES      Family History  Problem Relation Age of Onset  . Hypertension Mother     Social History   Tobacco Use  . Smoking status:  Current Every Day Smoker    Packs/day: 1.00    Years: 9.00    Pack years: 9.00    Types: Cigarettes  . Smokeless tobacco: Never Used  Vaping Use  . Vaping Use: Never used  Substance Use Topics  . Alcohol use: No    Comment: rare  . Drug use: Yes    Types: Marijuana    Comment: Last used 06/25/16    Allergies: No Known Allergies  Medications Prior to Admission  Medication Sig Dispense Refill Last Dose  . amLODipine (NORVASC) 5 MG tablet Take 1 tablet (5 mg total) by mouth daily. (Patient not taking: Reported on 07/22/2016) 30 tablet 1   . buPROPion (WELLBUTRIN SR) 150 MG 12 hr tablet Take 1 tablet (150 mg total) by mouth 2 (two) times daily. (Patient not taking: Reported on 07/22/2016) 60 tablet 2   . hydrALAZINE (APRESOLINE) 50 MG tablet Take 1 tablet (50 mg total) by mouth every 8 (eight) hours. 60 tablet 1   . oxyCODONE-acetaminophen (PERCOCET/ROXICET) 5-325 MG tablet Take 1-2 tablets by mouth every 6 (six) hours as needed for severe pain. (Patient not taking: Reported on 07/22/2016) 30 tablet 0   . Prenat-FeAsp-Meth-FA-DHA w/o A (PRENATE  PIXIE) 10-0.6-0.4-200 MG CAPS Take 1 tablet by mouth daily. 30 capsule 12     Review of Systems  Gastrointestinal: Positive for nausea and vomiting.  Genitourinary: Negative for vaginal bleeding and vaginal discharge.  Musculoskeletal: Negative for back pain.  Neurological: Positive for dizziness. Negative for syncope, weakness and headaches.  All other systems reviewed and are negative.  Physical Exam   Blood pressure 128/72, pulse 74, temperature 97.8 F (36.6 C), resp. rate 17, weight 75.3 kg, SpO2 100 %, currently breastfeeding.  Physical Exam Vitals and nursing note reviewed. Exam conducted with a chaperone present.  Constitutional:      Appearance: Normal appearance.  Cardiovascular:     Rate and Rhythm: Normal rate.     Pulses: Normal pulses.     Heart sounds: Normal heart sounds.  Pulmonary:     Effort: Pulmonary effort is  normal.     Breath sounds: Normal breath sounds.  Abdominal:     Tenderness: There is no right CVA tenderness or left CVA tenderness.     Comments: Gravid  Skin:    Capillary Refill: Capillary refill takes less than 2 seconds.  Neurological:     Mental Status: She is alert and oriented to person, place, and time.  Psychiatric:        Mood and Affect: Mood normal.        Behavior: Behavior normal.        Thought Content: Thought content normal.        Judgment: Judgment normal.     MAU Course  Procedures  --5 weeks by TVUS at time of attempted termination. EDC c/w dating achieved today.  --Given hx of Severe Preeclampsia and estimated GA of [redacted] weeks, CNM attempted to add on baseline PEC labs. Not enough supply from original collection to add on. Patient declines additional collection.  --Hx preterm delivery in setting of Preeclampsia. Discussed typical recommendation for bASA, initiating during 16 weeks. Explained starting now now consistent with data but won't hurt to add to regimen. Pt verbalizes preference to initiate now.  Patient Vitals for the past 24 hrs:  BP Temp Pulse Resp SpO2 Weight  12/19/20 1552 -- -- -- -- 100 % --  12/19/20 1551 128/72 -- 74 -- -- --  12/19/20 1205 131/73 97.8 F (36.6 C) (!) 107 17 -- 75.3 kg   Orders Placed This Encounter  Procedures  . Wet prep, genital  . [place] MFM OB Limited  . CBC  . Comprehensive metabolic panel  . Protein / creatinine ratio, urine  . Pregnancy, urine POC  . Discharge patient   Results for orders placed or performed during the hospital encounter of 12/19/20 (from the past 24 hour(s))  Pregnancy, urine POC     Status: Abnormal   Collection Time: 12/19/20 12:52 PM  Result Value Ref Range   Preg Test, Ur POSITIVE (A) NEGATIVE  CBC     Status: Abnormal   Collection Time: 12/19/20  2:07 PM  Result Value Ref Range   WBC 10.4 4.0 - 10.5 K/uL   RBC 3.35 (L) 3.87 - 5.11 MIL/uL   Hemoglobin 10.3 (L) 12.0 - 15.0 g/dL   HCT  95.6 (L) 38.7 - 46.0 %   MCV 88.1 80.0 - 100.0 fL   MCH 30.7 26.0 - 34.0 pg   MCHC 34.9 30.0 - 36.0 g/dL   RDW 56.4 33.2 - 95.1 %   Platelets 177 150 - 400 K/uL   nRBC 0.0 0.0 - 0.2 %  Comprehensive  metabolic panel     Status: Abnormal   Collection Time: 12/19/20  2:07 PM  Result Value Ref Range   Sodium 135 135 - 145 mmol/L   Potassium 3.2 (L) 3.5 - 5.1 mmol/L   Chloride 105 98 - 111 mmol/L   CO2 22 22 - 32 mmol/L   Glucose, Bld 77 70 - 99 mg/dL   BUN 6 6 - 20 mg/dL   Creatinine, Ser 5.39 0.44 - 1.00 mg/dL   Calcium 8.5 (L) 8.9 - 10.3 mg/dL   Total Protein 6.1 (L) 6.5 - 8.1 g/dL   Albumin 2.5 (L) 3.5 - 5.0 g/dL   AST 19 15 - 41 U/L   ALT 12 0 - 44 U/L   Alkaline Phosphatase 97 38 - 126 U/L   Total Bilirubin 0.4 0.3 - 1.2 mg/dL   GFR, Estimated >76 >73 mL/min   Anion gap 8 5 - 15  Wet prep, genital     Status: Abnormal   Collection Time: 12/19/20  4:26 PM   Specimen: PATH Cytology Cervicovaginal Ancillary Only  Result Value Ref Range   Yeast Wet Prep HPF POC NONE SEEN NONE SEEN   Trich, Wet Prep NONE SEEN NONE SEEN   Clue Cells Wet Prep HPF POC PRESENT (A) NONE SEEN   WBC, Wet Prep HPF POC MODERATE (A) NONE SEEN   Sperm NONE SEEN   Protein / creatinine ratio, urine     Status: None   Collection Time: 12/19/20  4:50 PM  Result Value Ref Range   Creatinine, Urine 355.00 mg/dL   Total Protein, Urine 39 mg/dL   Protein Creatinine Ratio 0.11 0.00 - 0.15 mg/mg[Cre]   Meds ordered this encounter  Medications  . acetaminophen (TYLENOL) tablet 1,000 mg  . Prenatal Vit-Fe Phos-FA-Omega (VITAFOL GUMMIES) 3.33-0.333-34.8 MG CHEW    Sig: Chew 1 tablet by mouth daily.    Dispense:  90 tablet    Refill:  5    Order Specific Question:   Supervising Provider    Answer:   [place] [id number]  . promethazine (PHENERGAN) 12.5 MG tablet    Sig: Take 1 tablet (12.5 mg total) by mouth every 6 (six) hours as needed for nausea or vomiting.    Dispense:  30 tablet    Refill:  0     Order Specific Question:   Supervising Provider    Answer:   [place] Bing [id number]  . aspirin EC 81 MG tablet    Sig: Take 1 tablet (81 mg total) by mouth daily. Take after 12 weeks for prevention of preeclampsia later in pregnancy    Dispense:  300 tablet    Refill:  2    Order Specific Question:   Supervising Provider    Answer:    Bing [[id number]]   [place] MFM OB Limited  Result Date: 12/19/2020 ----------------------------------------------------------------------  OBSTETRICS REPORT                       (Signed Final 12/19/2020 04:31 pm) ---------------------------------------------------------------------- Patient Info  ID #:       [id number]                          D.O.B.:  07/24/1986 (34 yrs)  Name:       Vanessa Wise               Visit Date: 12/19/2020 02:20 pm ---------------------------------------------------------------------- Performed By  Attending:  Corenthian Booker      Referred By:      [facility] MAU/Triage                    MD  Performed By:     Percell Boston          [facility] ---------------------------------------------------------------------- Orders  #  Description                           Code        Ordered By  1  [place] MFM OB LIMITED                     26333.54    Clayton Bibles ----------------------------------------------------------------------  #  Order #                     Accession #                Episode #  1  [id number]                   [id number]                 [id number] ---------------------------------------------------------------------- Indications  [redacted] weeks gestation of pregnancy                Z3A.29  Encounter for antenatal screening,             Z36.9  unspecified  Encounter for uncertain dates                  Z36.87  2 vessel umbilical cord                        O69.89X0  Fetal  abnormality - other known or             O35.9XX0  suspected (absent left kidney) ---------------------------------------------------------------------- Fetal Evaluation  Num Of Fetuses:         1  Fetal Heart Rate(bpm):  137  Cardiac Activity:       Observed  Presentation:           Cephalic  Placenta:               Anterior  P. Cord Insertion:      Not well visualized  Amniotic Fluid  AFI FV:      Within normal limits                              Largest Pocket(cm)  5.2 ---------------------------------------------------------------------- Biometry  BPD:      77.8  mm     G. Age:  31w 2d         82  %                                                          FL/BPD:     68.3   %    71 - 87  FL:       53.1  mm     G. Age:  28w 1d          6  % ---------------------------------------------------------------------- OB History  Gravidity:    4         Term:   1        Prem:   2        SAB:   0  TOP:          0       Ectopic:  0        Living: 3 ---------------------------------------------------------------------- Gestational Age  U/S Today:     29w 5d                                        EDD:   03/01/21  Best:          29w 5d     Det. By:  U/S (12/19/20)           EDD:   03/01/21 ---------------------------------------------------------------------- Anatomy  Thoracic:              Appears normal         Kidneys:                ? Absent left                                                                        kidney  Stomach:               Appears normal, left   Bladder:                Appears normal                         sided  Cord Vessels:          2 vessel cord,                         absent left umb art ---------------------------------------------------------------------- Cervix Uterus Adnexa  Cervix  Not visualized (advanced GA >24wks)  Uterus  No abnormality visualized.  Right Ovary  No adnexal mass visualized.  Left Ovary  No adnexal mass visualized.  Cul De Sac  No free  fluid seen.  Adnexa  No abnormality visualized. ---------------------------------------------------------------------- Impression  Limited exam due to uncertain dates.  Limited biometry performed suggestive of an EDD of 03/01/21  In addition, we observed  a single umbilical artery and absent  left kidney that may be a pelvic kidney vs unilateral renal  agenesis.  Diaphragm, situs and bladder are normal.  There is good amniotic fluid and fetal movement. ---------------------------------------------------------------------- Recommendations  Detailed anatomy and genetic screening/screening  recommended with MFM. ----------------------------------------------------------------------               Lin Landsman, MD Electronically Signed Final Report   12/19/2020 04:31 pm ----------------------------------------------------------------------  Assessment and Plan  --35 y.o. [id number] at [redacted]w[redacted]d  --Cat I tracing --Single umbilical artery --Absent fetal left kidney --No prenatal care --Discharge home in stable condition  F/U: --Patient scheduled to initiate care after 01/02/2021 due to insurance logistics --CNM will message clinic manager to ensure no other options available to achieve earlier appointment   Calvert Cantor, CNM 12/19/2020, 7:41 PM

## 2020-12-20 LAB — GC/CHLAMYDIA PROBE AMP (~~LOC~~) NOT AT ARMC
Chlamydia: NEGATIVE
Comment: NEGATIVE
Comment: NORMAL
Neisseria Gonorrhea: NEGATIVE

## 2021-01-02 ENCOUNTER — Ambulatory Visit (INDEPENDENT_AMBULATORY_CARE_PROVIDER_SITE_OTHER): Payer: Medicaid Other | Admitting: *Deleted

## 2021-01-02 DIAGNOSIS — Z3A31 31 weeks gestation of pregnancy: Secondary | ICD-10-CM

## 2021-01-02 DIAGNOSIS — O0990 Supervision of high risk pregnancy, unspecified, unspecified trimester: Secondary | ICD-10-CM

## 2021-01-02 DIAGNOSIS — Z3483 Encounter for supervision of other normal pregnancy, third trimester: Secondary | ICD-10-CM

## 2021-01-02 NOTE — Progress Notes (Signed)
I connected with  Vanessa Wise on 01/02/21 by a video enabled telemedicine application and verified that I am speaking with the correct person using two identifiers.   I discussed the limitations of evaluation and management by telemedicine. The patient expressed understanding and agreed to proceed.  PRENATAL INTAKE SUMMARY  Ms. Vanessa Wise presents today New OB Nurse Interview.  OB History    Gravida  4   Para  3   Term  1   Preterm  2   AB      Living  3     SAB      IAB      Ectopic      Multiple  0   Live Births  1          I have reviewed the patient's medical, obstetrical, social, and family histories, medications, and available lab results.  SUBJECTIVE She has no unusual complaints  OBJECTIVE Initial Physical Exam (New OB)  GENERAL APPEARANCE: sounds well via Televisit   ASSESSMENT Normal pregnancy  PLAN Prenatal care [facility] Orders Placed This Encounter  Procedures  . [place] MFM OB DETAIL +14 WK

## 2021-01-03 ENCOUNTER — Telehealth: Payer: Self-pay

## 2021-01-03 NOTE — Telephone Encounter (Signed)
Detail, Anatomy ultrasound scheduled for: Thursday 01/18/21 arrive at 1:15pm  Attempted to call patient, however call could not be complete at this time.  Sent My Chart message and mailed a reminder letter with map to address in Epic

## 2021-01-08 ENCOUNTER — Other Ambulatory Visit (HOSPITAL_COMMUNITY)
Admission: RE | Admit: 2021-01-08 | Discharge: 2021-01-08 | Disposition: A | Discharge status: Home / Self Care | Payer: Medicaid Other | Source: Ambulatory Visit | Attending: Obstetrics & Gynecology | Admitting: Obstetrics & Gynecology

## 2021-01-08 ENCOUNTER — Encounter: Payer: Self-pay | Admitting: Obstetrics & Gynecology

## 2021-01-08 ENCOUNTER — Ambulatory Visit (INDEPENDENT_AMBULATORY_CARE_PROVIDER_SITE_OTHER): Payer: Medicaid Other | Admitting: Obstetrics & Gynecology

## 2021-01-08 ENCOUNTER — Other Ambulatory Visit: Payer: Self-pay

## 2021-01-08 VITALS — BP 129/77 | HR 79 | Wt 170.0 lb

## 2021-01-08 DIAGNOSIS — Z3483 Encounter for supervision of other normal pregnancy, third trimester: Secondary | ICD-10-CM | POA: Diagnosis not present

## 2021-01-08 DIAGNOSIS — Z3A32 32 weeks gestation of pregnancy: Secondary | ICD-10-CM | POA: Diagnosis not present

## 2021-01-08 DIAGNOSIS — O0930 Supervision of pregnancy with insufficient antenatal care, unspecified trimester: Secondary | ICD-10-CM | POA: Insufficient documentation

## 2021-01-08 DIAGNOSIS — O359XX0 Maternal care for (suspected) fetal abnormality and damage, unspecified, not applicable or unspecified: Secondary | ICD-10-CM | POA: Diagnosis not present

## 2021-01-08 DIAGNOSIS — F17200 Nicotine dependence, unspecified, uncomplicated: Secondary | ICD-10-CM | POA: Diagnosis not present

## 2021-01-08 DIAGNOSIS — O074 Failed attempted termination of pregnancy without complication: Secondary | ICD-10-CM | POA: Diagnosis not present

## 2021-01-08 DIAGNOSIS — O34219 Maternal care for unspecified type scar from previous cesarean delivery: Secondary | ICD-10-CM

## 2021-01-08 DIAGNOSIS — Q270 Congenital absence and hypoplasia of umbilical artery: Secondary | ICD-10-CM

## 2021-01-08 MED ORDER — PANTOPRAZOLE SODIUM 20 MG PO TBEC: 20.0000 mg | DELAYED_RELEASE_TABLET | Freq: Every day | ORAL | 1 refills | Status: DC

## 2021-01-08 NOTE — Patient Instructions (Addendum)
Breastfeeding and Smoking It is not safe to smoke when you are breastfeeding. Smoking during breastfeeding is harmful to you and to your baby in many ways. When you smoke during breastfeeding, nicotine and other harmful substances pass through your milk to your baby. When you smoke, your baby is also exposed to cigarette smoke (secondhand smoke) and to surfaces contaminated with the harmful substances in cigarette smoke (thirdhand smoke). The dangers of smoking during breastfeeding apply to using e-cigarettes as well. These also expose your baby to nicotine and other toxic substances. If you are smoking and breastfeeding, do not stop breastfeeding. Breast milk is still the best food for your baby. Take steps to quit smoking. How does smoking affect me? Effects on your health The effects of smoking are well known. When you smoke, you increase your risk of:  Early death.  Cancer.  Heart disease.  Lung disease.  Stroke.  Eye disease.  Diabetes.  Rheumatoid arthritis.  Osteoporosis. Effects on breastfeeding Smoking also affects your ability to breastfeed successfully. Smoking lowers a chemical messenger in your body that is important for stimulating the production of breast milk. This chemical is a hormone called prolactin. Smoking during breastfeeding may lead to:  Having trouble breastfeeding.  Stopping breastfeeding early.  Producing breast milk that smells and tastes like a cigarette.  Producing breast milk that has a lower fat and calorie content. How does smoking affect my baby? Direct exposure When you smoke, nicotine and other toxic substances pass through your breast milk directly to your baby. This can affect the development of your baby's brain and body. It may also cause:  Colic.  Respiratory infections.  Ear infections.  Sudden infant death syndrome (SIDS).  Restlessness.  Increased heart rate.  Difficulty growing. Secondhand smoke When you smoke or  someone else smokes around your baby, your baby is also exposed to secondhand smoke. The effects of secondhand smoke on babies can include:  SIDS.  Ear infections.  Frequent colds.  Pneumonia.  Bronchitis.  Poor lung development.  Frequent asthma attacks, if your child has asthma. Thirdhand smoke When you or other people smoke in your home, toxic substances from smoke can gradually build up in your hair and clothing as well as in fabric on furniture and drapes. Your baby can be exposed to these substances after touching contaminated surfaces (thirdhand smoke exposure). This may be especially risky for babies because babies put their fingers into their mouth often. A baby's developing brain is also more sensitive to toxins. Breastfeeding tips and recommendations If you are still smoking:  Do not stop breastfeeding.  Cut back the amount you smoke and continue trying to stop smoking.  Smoke outside.  Smoke after you breastfeed your baby, not before.  Wash your hands and change your clothes before breastfeeding.  Do not smoke in your car. Follow these instructions at home:  Take over-the-counter and prescription medicines only as told by your health care provider.  Do not smoke in your house. Do not let anyone smoke in your house.  Keep all follow-up visits. This is important.   Where to find more information  For information on how to quit smoking, go to the smokefree.gov website.  For more information on how smoking affects breastfeeding, turn to the Centers for Disease Control and Prevention: DemocraticPeople.com.cy Contact a health care provider if:  You are smoking while breastfeeding.  You are struggling to stop smoking.  You are having trouble breastfeeding.  Your baby is restless and has trouble  sleeping.  Your baby has a fever, cough, or congestion.  Your baby is not gaining weight. Get help right away if:  Your baby is having trouble breathing. These  symptoms may represent a serious problem that is an emergency. Do not wait to see if the symptoms will go away. Get medical help right away. Call your local emergency services (911 in the [place] Do not drive yourself to the hospital. Summary  Smoking when you are breastfeeding is dangerous for you and for your baby.  Your baby may be affected by nicotine and toxic substances in your breast milk, secondhand smoke, and thirdhand smoke.  Ask your health care provider for help if you are struggling to stop smoking.  Do not stop breastfeeding. Stop smoking. This information is not intended to replace advice given to you by your health care provider. Make sure you discuss any questions you have with your health care provider. Document Revised: 02/23/2020 Document Reviewed: 02/23/2020 Elsevier Patient Education  2021 [employer] Third Trimester of Pregnancy  The third trimester of pregnancy is from week 28 through week 40. This is also called months 7 through 9. This trimester is when your unborn baby (fetus) is growing very fast. At the end of the ninth month, the unborn baby is about 20 inches long. It weighs about 6-10 pounds. Body changes during your third trimester Your body continues to go through many changes during this time. The changes vary and generally return to normal after the baby is born. Physical changes  Your weight will continue to increase. You may gain 25-35 pounds (11-16 kg) by the end of the pregnancy. If you are underweight, you may gain 28-40 lb (about 13-18 kg). If you are overweight, you may gain 15-25 lb (about 7-11 kg).  You may start to get stretch marks on your hips, belly (abdomen), and breasts.  Your breasts will continue to grow and may hurt. A yellow fluid (colostrum) may leak from your breasts. This is the first milk you are making for your baby.  You may have changes in your hair.  Your belly button may stick out.  You may have more swelling in your  hands, face, or ankles. Health changes  You may have heartburn.  You may have trouble pooping (constipation).  You may get hemorrhoids. These are swollen veins in the butt that can itch or get painful.  You may have swollen veins (varicose veins) in your legs.  You may have more body aches in the pelvis, back, or thighs.  You may have more tingling or numbness in your hands, arms, and legs. The skin on your belly may also feel numb.  You may feel short of breath as your womb (uterus) gets bigger. Other changes  You may pee (urinate) more often.  You may have more problems sleeping.  You may notice the unborn baby "dropping," or moving lower in your belly.  You may have more discharge coming from your vagina.  Your joints may feel loose, and you may have pain around your pelvic bone. Follow these instructions at home: Medicines  Take over-the-counter and prescription medicines only as told by your doctor. Some medicines are not safe during pregnancy.  Take a prenatal vitamin that contains at least 600 micrograms (mcg) of folic acid. Eating and drinking  Eat healthy meals that include: ? Fresh fruits and vegetables. ? Whole grains. ? Good sources of protein, such as meat, eggs, or tofu. ? Low-fat dairy products.  Avoid raw  meat and unpasteurized juice, milk, and cheese. These carry germs that can harm you and your baby.  Eat 4 or 5 small meals rather than 3 large meals a day.  You may need to take these actions to prevent or treat trouble pooping: ? Drink enough fluids to keep your pee (urine) pale yellow. ? Eat foods that are high in fiber. These include beans, whole grains, and fresh fruits and vegetables. ? Limit foods that are high in fat and sugar. These include fried or sweet foods. Activity  Exercise only as told by your doctor. Stop exercising if you start to have cramps in your womb.  Avoid heavy lifting.  Do not exercise if it is too hot or too humid, or  if you are in a place of great height (high altitude).  If you choose to, you may have sex unless your doctor tells you not to. Relieving pain and discomfort  Take breaks often, and rest with your legs raised (elevated) if you have leg cramps or low back pain.  Take warm water baths (sitz baths) to soothe pain or discomfort caused by hemorrhoids. Use hemorrhoid cream if your doctor approves.  Wear a good support bra if your breasts are tender.  If you develop bulging, swollen veins in your legs: ? Wear support hose as told by your doctor. ? Raise your feet for 15 minutes, 3-4 times a day. ? Limit salt in your food. Safety  Talk to your doctor before traveling far distances.  Do not use hot tubs, steam rooms, or saunas.  Wear your seat belt at all times when you are in a car.  Talk with your doctor if someone is hurting you or yelling at you a lot. Preparing for your baby's arrival To prepare for the arrival of your baby:  Take prenatal classes.  Visit the hospital and tour the maternity area.  Buy a rear-facing car seat. Learn how to install it in your car.  Prepare the baby's room. Take out all pillows and stuffed animals from the baby's crib. General instructions  Avoid cat litter boxes and soil used by cats. These carry germs that can cause harm to the baby and can cause a loss of your baby by miscarriage or stillbirth.  Do not douche or use tampons. Do not use scented sanitary pads.  Do not smoke or use any products that contain nicotine or tobacco. If you need help quitting, ask your doctor.  Do not drink alcohol.  Do not use herbal medicines, illegal drugs, or medicines that were not approved by your doctor. Chemicals in these products can affect your baby.  Keep all follow-up visits. This is important. Where to find more information  American Pregnancy Association: americanpregnancy.org  [employer]:  www.acog.org  Office on Women's Health: MightyReward.co.nz Contact a doctor if:  You have a fever.  You have mild cramps or pressure in your lower belly.  You have a nagging pain in your belly area.  You vomit, or you have watery poop (diarrhea).  You have bad-smelling fluid coming from your vagina.  You have pain when you pee, or your pee smells bad.  You have a headache that does not go away when you take medicine.  You have changes in how you see, or you see spots in front of your eyes. Get help right away if:  Your water breaks.  You have regular contractions that are less than 5 minutes apart.  You are  spotting or bleeding from your vagina.  You have very bad belly cramps or pain.  You have trouble breathing.  You have chest pain.  You faint.  You have not felt the baby move for the amount of time told by your doctor.  You have new or increased pain, swelling, or redness in an arm or leg. Summary  The third trimester is from week 28 through week 40 (months 7 through 9). This is the time when your unborn baby is growing very fast.  During this time, your discomfort may increase as you gain weight and as your baby grows.  Get ready for your baby to arrive by taking prenatal classes, buying a rear-facing car seat, and preparing the baby's room.  Get help right away if you are bleeding from your vagina, you have chest pain and trouble breathing, or you have not felt the baby move for the amount of time told by your doctor. This information is not intended to replace advice given to you by your health care provider. Make sure you discuss any questions you have with your health care provider. Document Revised: 03/29/2020 Document Reviewed: 02/03/2020 Elsevier Patient Education  2021 [employer].

## 2021-01-08 NOTE — Progress Notes (Signed)
Subjective:she received medication for abortion at 6 weeks    Vanessa Wise is a [id number] [redacted]w[redacted]d being seen today for her first obstetrical visit.  Her obstetrical history is significant for failed medical Ab, late prenatal care. Patient does intend to breast feed. Pregnancy history fully reviewed.  Patient reports heartburn.  Vitals:   01/08/21 1523  BP: 129/77  Pulse: 79  Weight: 170 lb (77.1 kg)    HISTORY: OB History  Gravida Para Term Preterm AB Living  4 3 1 2   3   SAB IAB Ectopic Multiple Live Births        0 1    # Outcome Date GA Lbr Len/2nd Weight Sex Delivery Anes PTL Lv  4 Current           3 Preterm 07/07/16 [redacted]w[redacted]d  2 lb 5.4 oz (1.06 kg) M CS-LTranv Gen  LIV  2 Preterm 12/22/07     Vag-Spont     1 Term 01/20/05     Vag-Spont      Past Medical History:  Diagnosis Date  . [redacted] weeks gestation of pregnancy   . Anxiety   . Chlamydia infection 02/03/2012  . IBS (irritable bowel syndrome)   . Migraines   . Pain due to intrauterine contraceptive device (IUD) (HCC) 01/17/2015   S/p hysteroscopic removal 01/17/2015   . Seasonal allergies    Past Surgical History:  Procedure Laterality Date  . CESAREAN SECTION N/A 07/07/2016   Procedure: CESAREAN SECTION;  Surgeon: 09/06/2016, MD;  Location: [facility] BIRTHING SUITES;  Service: Obstetrics;  Laterality: N/A;  . HYSTEROSCOPY N/A 01/17/2015   Procedure: HYSTEROSCOPY WITH REMOVAL INTRAUTERINE DIVICE;  Surgeon: 01/19/2015, MD;  Location: [facility] ORS;  Service: Gynecology;  Laterality: N/A;  . IUD REMOVAL    . MOUTH SURGERY    . NO PAST SURGERIES     Family History  Problem Relation Age of Onset  . Hypertension Mother      Exam    Uterus:     Pelvic Exam:    Perineum: No Hemorrhoids   Vulva: normal   Vagina:  normal mucosa   pH:     Cervix: no lesions   Adnexa: not evaluated   Bony Pelvis: average  System: Breast:  normal appearance, no masses or tenderness   Skin: normal coloration and turgor, no rashes     Neurologic: oriented, normal mood   Extremities: normal strength, tone, and muscle mass   HEENT PERRLA and neck supple with midline trachea   Mouth/Teeth mucous membranes moist, pharynx normal without lesions and dental hygiene good   Neck supple and no masses   Cardiovascular: regular rate and rhythm, no murmurs or gallops   Respiratory:  appears well, vitals normal, no respiratory distress, acyanotic, normal RR, neck free of mass or lymphadenopathy, chest clear, no wheezing, crepitations, rhonchi, normal symmetric air entry   Abdomen: soft, non-tender; bowel sounds normal; no masses,  no organomegaly   Urinary: urethral meatus normal      Assessment:    Pregnancy: Vanessa Wise Patient Active Problem List   Diagnosis Date Noted  . Fetal abnormality in antepartum pregnancy 12/19/2020  . Late prenatal care, antepartum 12/19/2020  . 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 Preeclampsia 06/26/2016  . Cannabis dependence 01/28/2012  . Chronic migraine 01/28/2012  . TOBACCO USER 11/06/2009        Plan:     Initial labs drawn. Prenatal vitamins. Problem list reviewed and updated. Genetic Screening too late  Ultrasound discussed; fetal survey: ordered.  Follow up in 2 weeks. 50% of 30 min visit spent on counseling and coordination of care.  Protonix for HB   Scheryl Darter 01/08/2021

## 2021-01-08 NOTE — Progress Notes (Signed)
Pt presents today for NOB visit. Pt is having issues with acid reflux. States she has taken Tums but no relief.

## 2021-01-10 LAB — CBC/D/PLT+RPR+RH+ABO+RUB AB...
Antibody Screen: NEGATIVE
Basophils Absolute: 0 10*3/uL (ref 0.0–0.2)
Basos: 0 %
EOS (ABSOLUTE): 0.1 10*3/uL (ref 0.0–0.4)
Eos: 1 %
HCV Ab: <0.1 s/co ratio (ref 0.0–0.9)
HIV Screen 4th Generation wRfx: NONREACTIVE
Hematocrit: 33.6 % — ABNORMAL LOW (ref 34.0–46.6)
Hemoglobin: 11.6 g/dL (ref 11.1–15.9)
Hepatitis B Surface Ag: NEGATIVE
Immature Grans (Abs): 0 10*3/uL (ref 0.0–0.1)
Immature Granulocytes: 0 %
Lymphocytes Absolute: 2.9 10*3/uL (ref 0.7–3.1)
Lymphs: 38 %
MCH: 30.4 pg (ref 26.6–33.0)
MCHC: 34.5 g/dL (ref 31.5–35.7)
MCV: 88 fL (ref 79–97)
Monocytes Absolute: 0.6 10*3/uL (ref 0.1–0.9)
Monocytes: 8 %
Neutrophils Absolute: 4 10*3/uL (ref 1.4–7.0)
Neutrophils: 53 %
Platelets: 206 10*3/uL (ref 150–450)
RBC: 3.81 x10E6/uL (ref 3.77–5.28)
RDW: 12.9 % (ref 11.7–15.4)
RPR Ser Ql: REACTIVE — AB
Rh Factor: POSITIVE
Rubella Antibodies, IGG: 1.01 index (ref >0.99)
WBC: 7.6 10*3/uL (ref 3.4–10.8)

## 2021-01-10 LAB — CYTOLOGY - PAP
Comment: NEGATIVE
Diagnosis: NEGATIVE
Diagnosis: REACTIVE
High risk HPV: NEGATIVE

## 2021-01-10 LAB — RPR, QUANT+TP ABS (REFLEX)
Rapid Plasma Reagin, Quant: 1:1 {titer} — ABNORMAL HIGH
T Pallidum Abs: NONREACTIVE

## 2021-01-10 LAB — HCV INTERPRETATION

## 2021-01-11 LAB — CULTURE, OB URINE

## 2021-01-11 LAB — URINE CULTURE, OB REFLEX

## 2021-01-18 ENCOUNTER — Ambulatory Visit: Payer: Medicaid Other

## 2021-01-18 ENCOUNTER — Other Ambulatory Visit: Payer: Medicaid Other

## 2021-01-19 ENCOUNTER — Encounter (HOSPITAL_COMMUNITY): Payer: Self-pay | Admitting: Obstetrics and Gynecology

## 2021-01-19 ENCOUNTER — Inpatient Hospital Stay (HOSPITAL_BASED_OUTPATIENT_CLINIC_OR_DEPARTMENT_OTHER): Payer: Medicaid Other

## 2021-01-19 ENCOUNTER — Telehealth: Payer: Self-pay

## 2021-01-19 ENCOUNTER — Encounter: Payer: Self-pay | Admitting: Obstetrics & Gynecology

## 2021-01-19 ENCOUNTER — Other Ambulatory Visit: Payer: Self-pay

## 2021-01-19 ENCOUNTER — Inpatient Hospital Stay (HOSPITAL_COMMUNITY)
Admission: AD | Admit: 2021-01-19 | Discharge: 2021-01-24 | DRG: 833 | Disposition: A | Discharge status: Home / Self Care | Payer: Medicaid Other | Attending: Obstetrics & Gynecology | Admitting: Obstetrics & Gynecology

## 2021-01-19 DIAGNOSIS — O288 Other abnormal findings on antenatal screening of mother: Secondary | ICD-10-CM

## 2021-01-19 DIAGNOSIS — R030 Elevated blood-pressure reading, without diagnosis of hypertension: Secondary | ICD-10-CM | POA: Diagnosis present

## 2021-01-19 DIAGNOSIS — Z20822 Contact with and (suspected) exposure to covid-19: Secondary | ICD-10-CM | POA: Diagnosis present

## 2021-01-19 DIAGNOSIS — Z3A34 34 weeks gestation of pregnancy: Secondary | ICD-10-CM | POA: Diagnosis not present

## 2021-01-19 DIAGNOSIS — O26893 Other specified pregnancy related conditions, third trimester: Secondary | ICD-10-CM | POA: Diagnosis present

## 2021-01-19 DIAGNOSIS — O4693 Antepartum hemorrhage, unspecified, third trimester: Secondary | ICD-10-CM | POA: Diagnosis not present

## 2021-01-19 DIAGNOSIS — F17210 Nicotine dependence, cigarettes, uncomplicated: Secondary | ICD-10-CM | POA: Diagnosis present

## 2021-01-19 DIAGNOSIS — O0933 Supervision of pregnancy with insufficient antenatal care, third trimester: Secondary | ICD-10-CM

## 2021-01-19 DIAGNOSIS — R768 Other specified abnormal immunological findings in serum: Secondary | ICD-10-CM | POA: Insufficient documentation

## 2021-01-19 DIAGNOSIS — R7689 Other specified abnormal immunological findings in serum: Secondary | ICD-10-CM | POA: Insufficient documentation

## 2021-01-19 DIAGNOSIS — O358XX0 Maternal care for other (suspected) fetal abnormality and damage, not applicable or unspecified: Secondary | ICD-10-CM

## 2021-01-19 DIAGNOSIS — O34219 Maternal care for unspecified type scar from previous cesarean delivery: Secondary | ICD-10-CM | POA: Diagnosis present

## 2021-01-19 DIAGNOSIS — O4690 Antepartum hemorrhage, unspecified, unspecified trimester: Secondary | ICD-10-CM | POA: Diagnosis present

## 2021-01-19 DIAGNOSIS — O0930 Supervision of pregnancy with insufficient antenatal care, unspecified trimester: Secondary | ICD-10-CM

## 2021-01-19 DIAGNOSIS — K219 Gastro-esophageal reflux disease without esophagitis: Secondary | ICD-10-CM | POA: Diagnosis present

## 2021-01-19 DIAGNOSIS — O99613 Diseases of the digestive system complicating pregnancy, third trimester: Secondary | ICD-10-CM | POA: Diagnosis present

## 2021-01-19 DIAGNOSIS — O99333 Smoking (tobacco) complicating pregnancy, third trimester: Secondary | ICD-10-CM | POA: Diagnosis present

## 2021-01-19 LAB — URINALYSIS, ROUTINE W REFLEX MICROSCOPIC
Glucose, UA: NEGATIVE mg/dL
Ketones, ur: NEGATIVE mg/dL
Leukocytes,Ua: NEGATIVE
Nitrite: NEGATIVE
Protein, ur: 100 mg/dL — AB
Specific Gravity, Urine: >1.03 — ABNORMAL HIGH (ref 1.005–1.030)
pH: 6.5 (ref 5.0–8.0)

## 2021-01-19 LAB — CBC WITH DIFFERENTIAL/PLATELET
Abs Immature Granulocytes: 0.04 10*3/uL (ref 0.00–0.07)
Basophils Absolute: 0 10*3/uL (ref 0.0–0.1)
Basophils Relative: 0 %
Eosinophils Absolute: 0.1 10*3/uL (ref 0.0–0.5)
Eosinophils Relative: 1 %
HCT: 32.6 % — ABNORMAL LOW (ref 36.0–46.0)
Hemoglobin: 10.8 g/dL — ABNORMAL LOW (ref 12.0–15.0)
Immature Granulocytes: 1 %
Lymphocytes Relative: 22 %
Lymphs Abs: 1.8 10*3/uL (ref 0.7–4.0)
MCH: 29.4 pg (ref 26.0–34.0)
MCHC: 33.1 g/dL (ref 30.0–36.0)
MCV: 88.8 fL (ref 80.0–100.0)
Monocytes Absolute: 0.6 10*3/uL (ref 0.1–1.0)
Monocytes Relative: 7 %
Neutro Abs: 5.5 10*3/uL (ref 1.7–7.7)
Neutrophils Relative %: 69 %
Platelets: 153 10*3/uL (ref 150–400)
RBC: 3.67 MIL/uL — ABNORMAL LOW (ref 3.87–5.11)
RDW: 13.1 % (ref 11.5–15.5)
WBC: 8 10*3/uL (ref 4.0–10.5)
nRBC: 0 % (ref 0.0–0.2)

## 2021-01-19 LAB — COMPREHENSIVE METABOLIC PANEL
ALT: 17 U/L (ref 0–44)
AST: 22 U/L (ref 15–41)
Albumin: 2.6 g/dL — ABNORMAL LOW (ref 3.5–5.0)
Alkaline Phosphatase: 148 U/L — ABNORMAL HIGH (ref 38–126)
Anion gap: 6 (ref 5–15)
BUN: <5 mg/dL — ABNORMAL LOW (ref 6–20)
CO2: 25 mmol/L (ref 22–32)
Calcium: 9.7 mg/dL (ref 8.9–10.3)
Chloride: 105 mmol/L (ref 98–111)
Creatinine, Ser: 0.68 mg/dL (ref 0.44–1.00)
GFR, Estimated: >60 mL/min (ref >60)
Glucose, Bld: 82 mg/dL (ref 70–99)
Potassium: 3.3 mmol/L — ABNORMAL LOW (ref 3.5–5.1)
Sodium: 136 mmol/L (ref 135–145)
Total Bilirubin: 0.8 mg/dL (ref 0.3–1.2)
Total Protein: 6.4 g/dL — ABNORMAL LOW (ref 6.5–8.1)

## 2021-01-19 LAB — URINALYSIS, MICROSCOPIC (REFLEX)

## 2021-01-19 LAB — SARS CORONAVIRUS 2 (TAT 6-24 HRS): SARS Coronavirus 2: NEGATIVE

## 2021-01-19 LAB — TYPE AND SCREEN
ABO/RH(D): O POS
Antibody Screen: NEGATIVE

## 2021-01-19 MED ORDER — CALCIUM CARBONATE ANTACID 500 MG PO CHEW
2.0000 | CHEWABLE_TABLET | ORAL | Status: DC | PRN
  Administered 2021-01-23: 400 mg via ORAL
  Filled 2021-01-19: qty 2

## 2021-01-19 MED ORDER — SODIUM CHLORIDE 0.9 % IV SOLN: 250.0000 mL | INTRAVENOUS | Status: DC | PRN

## 2021-01-19 MED ORDER — DOCUSATE SODIUM 100 MG PO CAPS
100.0000 mg | ORAL_CAPSULE | Freq: Every day | ORAL | Status: DC
  Administered 2021-01-19: 100 mg via ORAL
  Filled 2021-01-19 (×2): qty 1

## 2021-01-19 MED ORDER — LACTATED RINGERS IV BOLUS
1000.0000 mL | Freq: Once | INTRAVENOUS | Status: AC
  Administered 2021-01-19: 1000 mL via INTRAVENOUS

## 2021-01-19 MED ORDER — SODIUM CHLORIDE 0.9% FLUSH
3.0000 mL | INTRAVENOUS | Status: DC | PRN
  Administered 2021-01-22 – 2021-01-23 (×2): 3 mL via INTRAVENOUS

## 2021-01-19 MED ORDER — ZOLPIDEM TARTRATE 5 MG PO TABS
5.0000 mg | ORAL_TABLET | Freq: Every evening | ORAL | Status: DC | PRN
  Administered 2021-01-20 – 2021-01-22 (×3): 5 mg via ORAL
  Filled 2021-01-19 (×3): qty 1

## 2021-01-19 MED ORDER — PRENATAL MULTIVITAMIN CH
1.0000 | ORAL_TABLET | Freq: Every day | ORAL | Status: DC
  Filled 2021-01-19: qty 1

## 2021-01-19 MED ORDER — FAMOTIDINE IN NACL 20-0.9 MG/50ML-% IV SOLN
20.0000 mg | Freq: Once | INTRAVENOUS | Status: AC
  Administered 2021-01-19: 20 mg via INTRAVENOUS
  Filled 2021-01-19: qty 50

## 2021-01-19 MED ORDER — SODIUM CHLORIDE 0.9% FLUSH
3.0000 mL | Freq: Two times a day (BID) | INTRAVENOUS | Status: DC
  Administered 2021-01-20 – 2021-01-23 (×6): 3 mL via INTRAVENOUS

## 2021-01-19 MED ORDER — BETAMETHASONE SOD PHOS & ACET 6 (3-3) MG/ML IJ SUSP
12.0000 mg | INTRAMUSCULAR | Status: AC
  Administered 2021-01-19 – 2021-01-20 (×2): 12 mg via INTRAMUSCULAR
  Filled 2021-01-19: qty 5

## 2021-01-19 MED ORDER — ACETAMINOPHEN 325 MG PO TABS: 650.0000 mg | ORAL_TABLET | ORAL | Status: DC | PRN

## 2021-01-19 NOTE — Plan of Care (Signed)
  Problem: Education: Goal: Knowledge of General Education information will improve Description: Including pain rating scale, medication(s)/side effects and non-pharmacologic comfort measures Outcome: Progressing   Problem: Health Behavior/Discharge Planning: Goal: Ability to manage health-related needs will improve Outcome: Progressing   Problem: Clinical Measurements: Goal: Ability to maintain clinical measurements within normal limits will improve Outcome: Progressing   Problem: Activity: Goal: Risk for activity intolerance will decrease Outcome: Progressing   Problem: Nutrition: Goal: Adequate nutrition will be maintained Outcome: Progressing   Problem: Coping: Goal: Level of anxiety will decrease Outcome: Progressing   Problem: Education: Goal: Knowledge of disease or condition will improve Outcome: Progressing Goal: Knowledge of the prescribed therapeutic regimen will improve Outcome: Progressing   Problem: Clinical Measurements: Goal: Complications related to the disease process, condition or treatment will be avoided or minimized Outcome: Progressing

## 2021-01-19 NOTE — MAU Note (Addendum)
...  Vanessa Wise is a 35 y.o. at [redacted]w[redacted]d here in MAU reporting: vaginal bleeding that began at 1000. She reports she thought she was urinating on herself so she went to the restroom and touched her vagina and reports "it was a hand full of blood." No recent intercourse. Late to [facility]. First OB visit with Dr. Debroah Loop on 01/08/2021. She reports rectal pressure intermittently and new onset of generalized abdominal pain here in MAU that she rates a 5/10. +FM.   Hx of pre-eclampsia in previous pregnancy that resulted in C/S.  Current every day smoker.  FHT: 150 initial Lab orders placed from triage: UA

## 2021-01-19 NOTE — Telephone Encounter (Signed)
Received message pt called c/o vaginal bleeding Unable to reach pt; LVM for pt to contact the office

## 2021-01-19 NOTE — Progress Notes (Signed)
I received a referral from pt's nurse due to multiple stressors including the ultrasound showing that baby has one kidney.  At this time, Vanessa Wise's main concern is seeing her kids who are waiting for her outside.  Last time she was admitted to a hospital she ended up having to stay for an extended time.  She wants to be sure that her kids can see her in person today in case she will need to stay for an extended time so that they do not worry.  Her nurse let her know that she could do that whenever they arrive.    Chaplain Dyanne Carrel, Bcc Pager, [phone] 4:56 PM

## 2021-01-19 NOTE — MAU Provider Note (Deleted)
Review of Systems  Mau note started, switched to H&P

## 2021-01-19 NOTE — H&P (Signed)
**Note De-Identified Vanessa Obfuscation** Chief Complaint:  Vaginal Bleeding, Abdominal Pain, and rectal pressure   HPI: Vanessa Wise is a 35 y.o. [id number] at [redacted]w[redacted]d who presents to maternity admissions reporting vaginal bleeding. She thought she had wet herself but noted a large amount of dark red blood at 1000. No recent IC. 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 No initial cramping but has since started feeling mild cramps. Also reports reflux unresponsive to Tums. Denies leaking of fluid, decreased fetal movement, fever, falls, or recent illness.   Pregnancy Course: Late to [facility] because she had a medical abortion without follow up - unsure if this pregnancy is the same or closely spaced new pregnancy. Has had one visit with Dr. Debroah Loop at [facility]. Preterm delivery was r/t severe preeclampsia.   Past Medical History:  Diagnosis Date  . [redacted] weeks gestation of pregnancy   . Anxiety   . Chlamydia infection 02/03/2012  . IBS (irritable bowel syndrome)   . Migraines   . Pain due to intrauterine contraceptive device (IUD) (HCC) 01/17/2015   S/p hysteroscopic removal 01/17/2015   . Seasonal allergies    OB History  Gravida Para Term Preterm AB Living  4 3 1 2   3   SAB IAB Ectopic Multiple Live Births        0 1    # Outcome Date GA Lbr Len/2nd Weight Sex Delivery Anes PTL Lv  4 Current           3 Preterm 07/07/16 [redacted]w[redacted]d  2 lb 5.4 oz (1.06 kg) M CS-LTranv Gen  LIV  2 Preterm 12/22/07     Vag-Spont     1 Term 01/20/05     Vag-Spont      Past Surgical History:  Procedure Laterality Date  . CESAREAN SECTION N/A 07/07/2016   Procedure: CESAREAN SECTION;  Surgeon: 09/06/2016, MD;  Location: [facility];  Service: Obstetrics;  Laterality: N/A;  . HYSTEROSCOPY N/A 01/17/2015   Procedure: HYSTEROSCOPY WITH REMOVAL INTRAUTERINE DIVICE;  Surgeon: 01/19/2015, MD;  Location: [facility];  Service: Gynecology;  Laterality: N/A;  . IUD REMOVAL    . MOUTH SURGERY    . NO PAST SURGERIES      Family History  Problem Relation Age of Onset  . Hypertension Mother    Social History   Tobacco Use  . Smoking status: Current Every Day Smoker    Packs/day: 1.00    Years: 9.00    Pack years: 9.00    Types: Cigarettes  . Smokeless tobacco: Never Used  . Tobacco comment: 1 pack or less/ day  Vaping Use  . Vaping Use: Never used  Substance Use Topics  . Alcohol use: No    Comment: rare  . Drug use: Not Currently    Types: Marijuana    Comment: Last used 06/25/16   No Known Allergies Medications Prior to Admission  Medication Sig Dispense Refill Last Dose  . acetaminophen (TYLENOL) 500 MG tablet Take 500 mg by mouth every 6 (six) hours as needed.   Past Month at Unknown time  . Prenatal Vit-Fe Phos-FA-Omega (VITAFOL GUMMIES) 3.33-0.333-34.8 MG CHEW Chew 1 tablet by mouth daily. 90 tablet 5 01/19/2021 at Unknown time  . aspirin EC 81 MG tablet Take 1 tablet (81 mg total) by mouth daily. Take after 12 weeks for prevention of preeclampsia later in pregnancy 300 tablet 2   . pantoprazole (PROTONIX) 20 MG tablet Take 1 tablet (20 mg total) by  mouth daily. 30 tablet 1   . promethazine (PHENERGAN) 12.5 MG tablet Take 1 tablet (12.5 mg total) by mouth every 6 (six) hours as needed for nausea or vomiting. (Patient not taking: No sig reported) 30 tablet 0    I have reviewed patient's Past Medical Hx, Surgical Hx, Family Hx, Social Hx, medications and allergies.   ROS:  Review of Systems  Constitutional: Negative for fatigue and fever.  HENT: Negative for congestion and sore throat.   Eyes: Negative for visual disturbance.  Respiratory: Negative for cough.   Gastrointestinal: Positive for abdominal pain. Negative for nausea and vomiting.  Genitourinary: Positive for vaginal bleeding. Negative for frequency and urgency.  Neurological: Negative for syncope and headaches.  All other systems reviewed and are negative.  Physical Exam   Patient Vitals for the past 24 hrs:  BP Temp  Temp src Pulse SpO2  01/19/21 1331 118/76 -- -- 61 --  01/19/21 1316 121/79 -- -- (!) 58 --  01/19/21 1301 131/88 -- -- 64 --  01/19/21 1246 133/79 -- -- (!) 57 --  01/19/21 1231 125/83 -- -- (!) 56 --  01/19/21 1222 132/74 -- -- (!) 58 --  01/19/21 1116 124/77 -- -- 72 --  01/19/21 1101 (!) 138/93 -- -- 70 --  01/19/21 1056 (!) 144/87 98.1 F (36.7 C) Oral 71 100 %   Constitutional: Well-developed, well-nourished female in no acute distress.  Cardiovascular: normal rate & rhythm, no murmur Respiratory: normal effort, lung sounds clear throughout GI: Abd soft, non-tender, gravid appropriate for gestational age. Pos BS x 4 MS: Extremities nontender, no edema, normal ROM Neurologic: Alert and oriented x 4.  GU: no CVA tenderness Pelvic: NEFG, dark red blood in introitus, no frank bleeding. Cervix visually closed and also to digital exam.  Dilation: Closed Effacement (%): 50 Cervical Position: Middle Station: -1 Presentation: Vertex Exam by:: Edd Arbour, CNM  Fetal Tracing: reactive Baseline: 150 - 135 (decreased throughout MAU stay) Variability: moderate Accelerations: 15x15 Decelerations: none Toco: UI   Labs: Results for orders placed or performed during the hospital encounter of 01/19/21 (from the past 24 hour(s))  Urinalysis, Routine w reflex microscopic Urine, Clean Catch     Status: Abnormal   Collection Time: 01/19/21 11:30 AM  Result Value Ref Range   Color, Urine YELLOW YELLOW   APPearance CLEAR CLEAR   Specific Gravity, Urine >1.030 (H) 1.005 - 1.030   pH 6.5 5.0 - 8.0   Glucose, UA NEGATIVE NEGATIVE mg/dL   Hgb urine dipstick LARGE (A) NEGATIVE   Bilirubin Urine MODERATE (A) NEGATIVE   Ketones, ur NEGATIVE NEGATIVE mg/dL   Protein, ur 426 (A) NEGATIVE mg/dL   Nitrite NEGATIVE NEGATIVE   Leukocytes,Ua NEGATIVE NEGATIVE  Urinalysis, Microscopic (reflex)     Status: Abnormal   Collection Time: 01/19/21 11:30 AM  Result Value Ref Range   RBC / HPF 0-5 0 -  5 RBC/hpf   WBC, UA 6-10 0 - 5 WBC/hpf   Bacteria, UA MANY (A) NONE SEEN   Squamous Epithelial / LPF 0-5 0 - 5   Mucus PRESENT    Ca Oxalate Crys, UA PRESENT   CBC with Differential/Platelet     Status: Abnormal   Collection Time: 01/19/21 12:15 PM  Result Value Ref Range   WBC 8.0 4.0 - 10.5 K/uL   RBC 3.67 (L) 3.87 - 5.11 MIL/uL   Hemoglobin 10.8 (L) 12.0 - 15.0 g/dL   HCT 83.4 (L) 19.6 - 22.2 %   MCV  88.8 80.0 - 100.0 fL   MCH 29.4 26.0 - 34.0 pg   MCHC 33.1 30.0 - 36.0 g/dL   RDW 96.0 45.4 - 09.8 %   Platelets 153 150 - 400 K/uL   nRBC 0.0 0.0 - 0.2 %   Neutrophils Relative % 69 %   Neutro Abs 5.5 1.7 - 7.7 K/uL   Lymphocytes Relative 22 %   Lymphs Abs 1.8 0.7 - 4.0 K/uL   Monocytes Relative 7 %   Monocytes Absolute 0.6 0.1 - 1.0 K/uL   Eosinophils Relative 1 %   Eosinophils Absolute 0.1 0.0 - 0.5 K/uL   Basophils Relative 0 %   Basophils Absolute 0.0 0.0 - 0.1 K/uL   Immature Granulocytes 1 %   Abs Immature Granulocytes 0.04 0.00 - 0.07 K/uL  Comprehensive metabolic panel     Status: Abnormal   Collection Time: 01/19/21 12:15 PM  Result Value Ref Range   Sodium 136 135 - 145 mmol/L   Potassium 3.3 (L) 3.5 - 5.1 mmol/L   Chloride 105 98 - 111 mmol/L   CO2 25 22 - 32 mmol/L   Glucose, Bld 82 70 - 99 mg/dL   BUN <5 (L) 6 - 20 mg/dL   Creatinine, Ser 1.19 0.44 - 1.00 mg/dL   Calcium 9.7 8.9 - 14.7 mg/dL   Total Protein 6.4 (L) 6.5 - 8.1 g/dL   Albumin 2.6 (L) 3.5 - 5.0 g/dL   AST 22 15 - 41 U/L   ALT 17 0 - 44 U/L   Alkaline Phosphatase 148 (H) 38 - 126 U/L   Total Bilirubin 0.8 0.3 - 1.2 mg/dL   GFR, Estimated >82 >95 mL/min   Anion gap 6 5 - 15  Type and screen     Status: None   Collection Time: 01/19/21 12:15 PM  Result Value Ref Range   ABO/RH(D) O POS    Antibody Screen NEG    Sample Expiration      01/22/2021,2359 Performed at [facility], [address], [place], [place] [address]    Imaging:  [place] MFM OB DETAIL +14 WK  Result Date:  01/19/2021 ----------------------------------------------------------------------  OBSTETRICS REPORT                       (Signed Final 01/19/2021 12:53 pm) ---------------------------------------------------------------------- Patient Info  ID #:       [id number]                          D.O.B.:  02/06/1986 (34 yrs)  Name:       Vanessa Wise               Visit Date: 01/19/2021 12:07 pm ---------------------------------------------------------------------- Performed By  Attending:        Lin Landsman      Secondary Phy.:   Judye Bos                    MD                                                             Chace Bisch CNM  Performed By:     Marcellina Millin          Address[facility]  [facility]  Referred By:      [facility] MAU/Triage         [facility] ---------------------------------------------------------------------- Orders  #  Description                           Code        Ordered By  1  [place] MFM OB DETAIL +14 WK               [id number]    Edd Arbour ----------------------------------------------------------------------  #  Order #                     Accession #                Episode #  1  [id number]                   [id number]                 [id number] ---------------------------------------------------------------------- Indications  Vaginal bleeding in pregnancy, third trimester O46.93  [redacted] weeks gestation of pregnancy                Z3A.34  Insufficient Prenatal Care                     O09.30  Fetal abnormality - other known or suspected   O35.9XX0  2 vessel umbilical cord                        O69.89X0 ---------------------------------------------------------------------- Fetal Evaluation  Num Of Fetuses:         1  Fetal Heart Rate(bpm):   150  Cardiac Activity:       Observed  Presentation:           Cephalic  Placenta:               Anterior  P. Cord Insertion:      Visualized  Amniotic Fluid  AFI FV:      Within normal limits  AFI Sum(cm)     %Tile       Largest Pocket(cm)  16.99  62          7.56  RUQ(cm)       RLQ(cm)       LUQ(cm)        LLQ(cm)  4.75          7.56          2.15           2.53  Comment:    No placental abruption or previa identified. ---------------------------------------------------------------------- Biometry  BPD:      86.2  mm     G. Age:  34w 5d         67  %    CI:        78.29   %    70 - 86                                                          FL/HC:      20.5   %    19.4 - 21.8  HC:      308.2  mm     G. Age:  34w 3d         21  %    HC/AC:      1.05        0.96 - 1.11  AC:      294.6  mm     G. Age:  33w 3d         33  %    FL/BPD:     73.3   %    71 - 87  FL:       63.2  mm     G. Age:  32w 5d         10  %    FL/AC:      21.5   %    20 - 24  HUM:        55  mm     G. Age:  32w 0d         15  %  Est. FW:    2197  gm    4 lb 13 oz      24  % ---------------------------------------------------------------------- OB History  Gravidity:    4         Term:   1        Prem:   2        SAB:   0  TOP:          0       Ectopic:  0        Living: 3 ---------------------------------------------------------------------- Gestational Age  U/S Today:     33w 6d                                        EDD:   03/03/21  Best:          34w 1d     Det. By:  U/S  (12/19/20)          EDD:   03/01/21 ---------------------------------------------------------------------- Anatomy  Cranium:               Appears normal  Aortic Arch:            Appears normal  Cavum:                 Not well visualized    Ductal Arch:            Not well visualized  Ventricles:            Appears normal         Diaphragm:              Appears normal  Choroid Plexus:        Not well visualized    Stomach:                Appears normal, left                                                                         sided  Cerebellum:            Appears normal         Abdomen:                Appears normal  Posterior Fossa:       Appears normal         Abdominal Wall:         Not well visualized  Nuchal Fold:           Not applicable (>20    Cord Vessels:           2 Vessel Cord                         wks GA)  Face:                  Orbits nl; profile not Kidneys:                Absent left kidney                         well visualized  Lips:                  Not well visualized    Bladder:                Appears normal  Thoracic:              Appears normal         Spine:                  Not well visualized  Heart:                 Appears normal         Upper Extremities:      Visualized                         (4CH, axis, and                         situs)  RVOT:                  Not  well visualized    Lower Extremities:      Visualized  LVOT:                  Appears normal  Other:  Nasal bone visualized. 3VV and 3VTV visualized. Technically difficult          due to advanced gestational age. ---------------------------------------------------------------------- Cervix Uterus Adnexa  Cervix  Not visualized (advanced GA >24wks)  Adnexa  No abnormality visualized. ---------------------------------------------------------------------- Impression  Single intrauterine pregnancy here for a detailed anatomy  due to single umbilical artery and absent left kidney  Vanessa Wise presents with vaginal bleeding at L&D today  Normal anatomy with measurements consistent with dates  There is good fetal movement and amniotic fluid volume  Suboptimal views of the fetal anatomy were obtained  secondary to fetal position.  There is no evidence of placental abruption or previa.  There is no evidence of a left kidney there is a lying adrenal  gland, absent left renal artery. There is no evidence of a  pelvic kidney.  ---------------------------------------------------------------------- Recommendations  Follow up growth in 4 weeks  Fetal echocardiogram recommended at first available  appointment  Genetic screening Vanessa cell free DNA if not previously  performed. ----------------------------------------------------------------------               Lin Landsmanorenthian Booker, MD Electronically Signed Final Report   01/19/2021 12:53 pm ----------------------------------------------------------------------  MAU Course: Orders Placed This Encounter  Procedures  . Culture, beta strep (group b only)  . SARS CORONAVIRUS 2 (TAT 6-24 HRS) Nasopharyngeal Nasopharyngeal Swab  . US MFM OB DETAIL +14 WK  . Urinalysis, Routine w reflex microscopic Urine, Clean Catch  . CBC with Differential/Platelet  . Comprehensive metabolic panel  . Urinalysis, Microscopic (reflex)  . Diet regular Room service appropriate? Yes; Fluid consistency: Thin  . Notify physician (specify)  . Vital signs  . Defer vaginal exam for vaginal bleeding or PROM <37 weeks  . Initiate Oral Care Protocol  . Initiate Carrier Fluid Protocol  . SCDs  . Continuous tocometry  . Fetal monitoring  . Bed rest with bathroom privileges  . Full code  . Airborne and Contact precautions  . Type and screen  . Place in observation (patient's expected length of stay will be less than 2 midnights)   Meds ordered this encounter  Medications  . famotidine (PEPCID) IVPB 20 mg premix  . lactated ringers bolus 1,000 mL  . acetaminophen (TYLENOL) tablet 650 mg  . zolpidem (AMBIEN) tablet 5 mg  . docusate sodium (COLACE) capsule 100 mg  . calcium carbonate (TUMS - dosed in mg elemental calcium) chewable tablet 400 mg of elemental calcium  . prenatal multivitamin tablet 1 tablet  . sodium chloride flush (NS) 0.9 % injection 3 mL  . sodium chloride flush (NS) 0.9 % injection 3 mL  . 0.9 %  sodium chloride infusion   MDM: Bleeding not vigorous but still moderate U/S = no  evidence of previa or abruption  Discussed presentation and findings with Dr. Earlene Plateravis who [facility]. MD will put in orders.   Assessment: 1. Vaginal bleeding in pregnancy, third trimester    [facility]   Edd ArbourJamilla Azarel Banner, CNM, MSN, IBCLC Certified Nurse Midwife, [facility]

## 2021-01-20 DIAGNOSIS — O4693 Antepartum hemorrhage, unspecified, third trimester: Secondary | ICD-10-CM | POA: Diagnosis present

## 2021-01-20 DIAGNOSIS — O99333 Smoking (tobacco) complicating pregnancy, third trimester: Secondary | ICD-10-CM | POA: Diagnosis present

## 2021-01-20 DIAGNOSIS — F17210 Nicotine dependence, cigarettes, uncomplicated: Secondary | ICD-10-CM | POA: Diagnosis present

## 2021-01-20 DIAGNOSIS — K219 Gastro-esophageal reflux disease without esophagitis: Secondary | ICD-10-CM | POA: Diagnosis present

## 2021-01-20 DIAGNOSIS — O0933 Supervision of pregnancy with insufficient antenatal care, third trimester: Secondary | ICD-10-CM | POA: Diagnosis not present

## 2021-01-20 DIAGNOSIS — Z3A34 34 weeks gestation of pregnancy: Secondary | ICD-10-CM | POA: Diagnosis not present

## 2021-01-20 DIAGNOSIS — R030 Elevated blood-pressure reading, without diagnosis of hypertension: Secondary | ICD-10-CM | POA: Diagnosis present

## 2021-01-20 DIAGNOSIS — O99613 Diseases of the digestive system complicating pregnancy, third trimester: Secondary | ICD-10-CM | POA: Diagnosis present

## 2021-01-20 DIAGNOSIS — O34219 Maternal care for unspecified type scar from previous cesarean delivery: Secondary | ICD-10-CM | POA: Diagnosis present

## 2021-01-20 DIAGNOSIS — O26893 Other specified pregnancy related conditions, third trimester: Secondary | ICD-10-CM | POA: Diagnosis present

## 2021-01-20 DIAGNOSIS — Z20822 Contact with and (suspected) exposure to covid-19: Secondary | ICD-10-CM | POA: Diagnosis present

## 2021-01-20 MED ORDER — LACTATED RINGERS IV BOLUS
500.0000 mL | Freq: Once | INTRAVENOUS | Status: AC
  Administered 2021-01-20: 500 mL via INTRAVENOUS

## 2021-01-20 NOTE — Progress Notes (Signed)
[id number] RN in room to adjust monitors, observed patient had removed monitors and was getting into wheelchair. Patient refuses to be monitored and was taken off unit in wheelchair by family member. Patient stated "I am coming back." Dr. Charlotta Newton called at [phone] and updated on patient.

## 2021-01-20 NOTE — Progress Notes (Signed)
Patient removed EFM monitors. RN went in to assess and patient states she is uncomfortable, and frustrated due to she is unable to sleep being uncomfortable from the monitors being on. Patient stated she would like a couple hours to sleep and refusing monitoring at this time. RN notified MD, and will reassess patient's willingness to continue EFM in a couple hours.

## 2021-01-20 NOTE — Progress Notes (Addendum)
FACULTY PRACTICE ANTEPARTUM(COMPREHENSIVE) NOTE  Vanessa Wise is a 35 y.o. [phone] with Estimated Date of Delivery: 02/27/21  [redacted]w[redacted]d  who is admitted for vaginal bleeding.    Fetal presentation is cephalic. Length of Stay:  0  Days  Date of admission:01/19/2021  Subjective: Patient reports the fetal movement as active. Patient reports uterine contraction  activity as none. Patient reports  vaginal bleeding as no bleeding overnight. Patient describes fluid per vagina as None. Resting comfortably in bed, no acute complaints overnight- feels as though everything is much better and completely different than when she came in.  No headache, no blurry vision,   Vitals:  Blood pressure 126/89, pulse 93, temperature 98.3 F (36.8 C), temperature source Oral, resp. rate 18, height  (1.626 m), weight 77.1 kg, SpO2 100 %, currently breastfeeding. Vitals:   01/19/21 2004 01/19/21 2340 01/20/21 0755 01/20/21 1100  BP: 131/89 134/77 105/67 126/89  Pulse: 60 75 64 93  Resp: Temp: 98.9 F (37.2 C) 98.5 F (36.9 C) 98.2 F (36.8 C) 98.3 F (36.8 C)  TempSrc: Oral Oral Oral Oral  SpO2: 100% 99% 99% 100%  Weight:      Height:       Physical Examination: Gen: no acute distress Skin: warm and dry CV: RRR Chest: CTAB Abd: gravid, soft and nontender GU: deferred Ext: no edema, no calf tenderness Psych: mood appropriate  Fetal Monitoring:  Baseline: 135 bpm, Variability: moderate, Accelerations: present, The accelerations are >15 bpm and more than 2 in 20 minutes, and Decelerations: Absent   reactive  Toco: no contractions Final Diagnosis:  Reactive NST   Labs:  Results for orders placed or performed during the hospital encounter of 01/19/21 (from the past 24 hour(s))  CBC with Differential/Platelet   Collection Time: 01/19/21 12:15 PM  Result Value Ref Range   WBC 8.0 4.0 - 10.5 K/uL   RBC 3.67 (L) 3.87 - 5.11 MIL/uL   Hemoglobin 10.8 (L) 12.0 - 15.0 g/dL   HCT 84.1  (L) 32.4 - 46.0 %   MCV 88.8 80.0 - 100.0 fL   MCH 29.4 26.0 - 34.0 pg   MCHC 33.1 30.0 - 36.0 g/dL   RDW 40.1 02.7 - 25.3 %   Platelets 153 150 - 400 K/uL   nRBC 0.0 0.0 - 0.2 %   Neutrophils Relative % 69 %   Neutro Abs 5.5 1.7 - 7.7 K/uL   Lymphocytes Relative 22 %   Lymphs Abs 1.8 0.7 - 4.0 K/uL   Monocytes Relative 7 %   Monocytes Absolute 0.6 0.1 - 1.0 K/uL   Eosinophils Relative 1 %   Eosinophils Absolute 0.1 0.0 - 0.5 K/uL   Basophils Relative 0 %   Basophils Absolute 0.0 0.0 - 0.1 K/uL   Immature Granulocytes 1 %   Abs Immature Granulocytes 0.04 0.00 - 0.07 K/uL  Comprehensive metabolic panel   Collection Time: 01/19/21 12:15 PM  Result Value Ref Range   Sodium 136 135 - 145 mmol/L   Potassium 3.3 (L) 3.5 - 5.1 mmol/L   Chloride 105 98 - 111 mmol/L   CO2 25 22 - 32 mmol/L   Glucose, Bld 82 70 - 99 mg/dL   BUN <5 (L) 6 - 20 mg/dL   Creatinine, Ser 6.64 0.44 - 1.00 mg/dL   Calcium 9.7 8.9 - 40.3 mg/dL   Total Protein 6.4 (L) 6.5 - 8.1 g/dL   Albumin 2.6 (L) 3.5 - 5.0 g/dL  AST 22 15 - 41 U/L   ALT 17 0 - 44 U/L   Alkaline Phosphatase 148 (H) 38 - 126 U/L   Total Bilirubin 0.8 0.3 - 1.2 mg/dL   GFR, Estimated >41 >93 mL/min   Anion gap 6 5 - 15  Type and screen   Collection Time: 01/19/21 12:15 PM  Result Value Ref Range   ABO/RH(D) O POS    Antibody Screen NEG    Sample Expiration      01/22/2021,2359 Performed at [facility], [address] [place], [place] [address]   SARS CORONAVIRUS 2 (TAT 6-24 HRS) Nasopharyngeal Nasopharyngeal Swab   Collection Time: 01/19/21  1:32 PM   Specimen: Nasopharyngeal Swab  Result Value Ref Range   SARS Coronavirus 2 NEGATIVE NEGATIVE    Imaging Studies:    [place] MFM OB DETAIL +14 WK  Result Date: 01/19/2021 ----------------------------------------------------------------------  OBSTETRICS REPORT                       (Signed Final 01/19/2021 12:53 pm)  ---------------------------------------------------------------------- Patient Info  ID #:       [id number]                          D.O.B.:  [birth year] (34 yrs)  Name:       Vanessa Wise               Visit Date: 01/19/2021 12:07 pm ---------------------------------------------------------------------- Performed By  Attending:        Lin Landsman      Secondary Phy.:   Judye Bos                    MD                                                             Dan Humphreys CNM  Performed By:     Marcellina Millin          Address[facility]  Referred By:      [facility] MAU/Triage         [facility] ---------------------------------------------------------------------- Orders  #  Description                           Code        Ordered By  1  [place] MFM OB DETAIL +14 WK               93267.12    Edd Arbour ----------------------------------------------------------------------  #  Order #                     Accession #                Episode #  1  [id number]                   [id number]                 [id number] ---------------------------------------------------------------------- Indications  Vaginal bleeding in pregnancy, third trimester O46.93  [redacted] weeks gestation of pregnancy                Z3A.34  Insufficient Prenatal Care                     O09.30  Fetal abnormality - other known or suspected   O35.9XX0  2 vessel umbilical cord                        O69.89X0 ---------------------------------------------------------------------- Fetal Evaluation  Num Of Fetuses:         1  Fetal Heart Rate(bpm):  150  Cardiac Activity:       Observed  Presentation:           Cephalic  Placenta:               Anterior  P. Cord Insertion:      Visualized  Amniotic Fluid   AFI FV:      Within normal limits  AFI Sum(cm)     %Tile       Largest Pocket(cm)  16.99           62          7.56  RUQ(cm)       RLQ(cm)       LUQ(cm)        LLQ(cm)  4.75          7.56          2.15           2.53  Comment:    No placental abruption or previa identified. ---------------------------------------------------------------------- Biometry  BPD:      86.2  mm     G. Age:  34w 5d         67  %    CI:        78.29   %    70 - 86                                                          FL/HC:      20.5   %    19.4 - 21.8  HC:      308.2  mm     G. Age:  54w 3d  21  %    HC/AC:      1.05        0.96 - 1.11  AC:      294.6  mm     G. Age:  33w 3d         33  %    FL/BPD:     73.3   %    71 - 87  FL:       63.2  mm     G. Age:  32w 5d         10  %    FL/AC:      21.5   %    20 - 24  HUM:        55  mm     G. Age:  32w 0d         15  %  Est. FW:    2197  gm    4 lb 13 oz      24  % ---------------------------------------------------------------------- OB History  Gravidity:    4         Term:   1        Prem:   2        SAB:   0  TOP:          0       Ectopic:  0        Living: 3 ---------------------------------------------------------------------- Gestational Age  U/S Today:     33w 6d                                        EDD:   03/03/21  Best:          34w 1d     Det. By:  U/S  (12/19/20)          EDD:   03/01/21 ---------------------------------------------------------------------- Anatomy  Cranium:               Appears normal         Aortic Arch:            Appears normal  Cavum:                 Not well visualized    Ductal Arch:            Not well visualized  Ventricles:            Appears normal         Diaphragm:              Appears normal  Choroid Plexus:        Not well visualized    Stomach:                Appears normal, left                                                                        sided  Cerebellum:            Appears normal         Abdomen:  Appears  normal  Posterior Fossa:       Appears normal         Abdominal Wall:         Not well visualized  Nuchal Fold:           Not applicable (>20    Cord Vessels:           2 Vessel Cord                         wks GA)  Face:                  Orbits nl; profile not Kidneys:                Absent left kidney                         well visualized  Lips:                  Not well visualized    Bladder:                Appears normal  Thoracic:              Appears normal         Spine:                  Not well visualized  Heart:                 Appears normal         Upper Extremities:      Visualized                         (4CH, axis, and                         situs)  RVOT:                  Not well visualized    Lower Extremities:      Visualized  LVOT:                  Appears normal  Other:  Nasal bone visualized. 3VV and 3VTV visualized. Technically difficult          due to advanced gestational age. ---------------------------------------------------------------------- Cervix Uterus Adnexa  Cervix  Not visualized (advanced GA >24wks)  Adnexa  No abnormality visualized. ---------------------------------------------------------------------- Impression  Single intrauterine pregnancy here for a detailed anatomy  due to single umbilical artery and absent left kidney  Ms. Montez MoritaCarter presents with vaginal bleeding at L&D today  Normal anatomy with measurements consistent with dates  There is good fetal movement and amniotic fluid volume  Suboptimal views of the fetal anatomy were obtained  secondary to fetal position.  There is no evidence of placental abruption or previa.  There is no evidence of a left kidney there is a lying adrenal  gland, absent left renal artery. There is no evidence of a  pelvic kidney. ---------------------------------------------------------------------- Recommendations  Follow up growth in 4 weeks  Fetal echocardiogram recommended at first available  appointment  Genetic screening via cell free  DNA if not previously  performed. ----------------------------------------------------------------------               Lin Landsmanorenthian Booker, MD Electronically Signed Final Report   01/19/2021 12:53 pm ----------------------------------------------------------------------  Medications:  Scheduled . betamethasone acetate-betamethasone sodium phosphate  12 mg Intramuscular Q24 Hr x 2  . docusate sodium  100 mg Oral Daily  . prenatal multivitamin  1 tablet Oral Q1200  . sodium chloride flush  3 mL Intravenous Q12H   I have reviewed the patient's current medications.  ASSESSMENT: [id number] [redacted]w[redacted]d Estimated Date of Delivery: 02/27/21  Patient Active Problem List   Diagnosis Date Noted  . 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 Vaginal bleeding during pregnancy 01/19/2021  . Fetal abnormality in antepartum pregnancy 12/19/2020  . Late prenatal care, antepartum 12/19/2020  . 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 Preeclampsia 06/26/2016  . Cannabis dependence 01/28/2012  . Chronic migraine 01/28/2012  . TOBACCO USER 11/06/2009    PLAN: 34yo [id number]@ [redacted]w[redacted]d admitted due to vaginal bleeding 1)Vaginal bleeding No further bleeding since admission, no evidence of preterm labor 2nd BMTZ today -continue NST/Toco monitoring q shift  2) h/o C-section x2, desires TOLAC 3) h/o preeclampsia On ASA daily, BP within normal limits, will continue to monitor -h/o tobacco use  4) Fetal well being -anatomy scan with SUA and absent left kidney, MFM recommendation for fetal ECHO and f/u growth in 3-4wks  DISPO: Plan for in-house monitoring for 5 days- if no further bleeding or other acute concerns, plan for discharge home at that time (3/23).  Pt does have some social concerns about the care of her other children- letter written for partner to confirm that she has been admitted.  Questions and concerns were addressed.  Alessandra Bevels Lashawne Dura 01/20/2021,11:42 AM

## 2021-01-20 NOTE — Progress Notes (Signed)
CSW acknowledged consult and met with patient at bedside. CSW inquired about patient's needs. Patient denied any current needs. Patient spoke with CSW about her previous birthing experience and current concerns. CSW actively listened and provided emotional support. Patient spoke about feeling unheard, CSW assisted patient in processing her feelings. CSW encouraged patient to continue to verbalize her needs to the medical team, patient agreed. Patient spoke about the difficulties associated with being away from her family. CSW acknowledged and validated patient's feelings. Patient shared that having emotional support at the hospital has been important to her. CSW inquired about Patient's interest in meeting with the [facility], patient reported that she is interested in meeting with [place] tomorrow. CSW agreed to notify [place]. CSW asked patient if she was interested in CSW checking in on Monday, patient reported yes. CSW agreed to come back to see patient on Monday. Patient thanked CSW for visit.   CSW will continue to offer support while patient is admitted.   CSW paged Mable Fill and will provide update when call is returned.   Abundio Miu, [place] Worker [facility] Cell#: [phone]

## 2021-01-20 NOTE — Progress Notes (Signed)
RN asked patient if she was willing to continue EFM monitoring. She was sleeping and will allow ongoing shift RN to put her back on once awake.

## 2021-01-21 DIAGNOSIS — Z3A34 34 weeks gestation of pregnancy: Secondary | ICD-10-CM

## 2021-01-21 LAB — OB RESULTS CONSOLE GBS: GBS: NEGATIVE

## 2021-01-21 LAB — CULTURE, BETA STREP (GROUP B ONLY)

## 2021-01-21 MED ORDER — COMPLETENATE 29-1 MG PO CHEW
1.0000 | CHEWABLE_TABLET | Freq: Every day | ORAL | Status: DC
  Administered 2021-01-21 – 2021-01-23 (×3): 1 via ORAL
  Filled 2021-01-21 (×3): qty 1

## 2021-01-21 NOTE — Progress Notes (Addendum)
FACULTY PRACTICE ANTEPARTUM(COMPREHENSIVE) NOTE  Marga MelnickLashonda M Fritchman is a 35 y.o. [phone] with Estimated Date of Delivery: 02/27/21  7530w5d  who is admitted for vaginal bleeding.    Fetal presentation is cephalic. Length of Stay:  1  Days  Date of admission:01/19/2021  Subjective: Resting comfortably in bed, no acute complaints overnight.  Patient reports the fetal movement as active. Patient reports uterine contraction  activity as none. Patient reports  vaginal bleeding as no bleeding since admission Patient describes fluid per vagina as none.   Vitals:  Blood pressure 121/80, pulse 86, temperature 98.1 F (36.7 C), temperature source Oral, resp. rate 16, height 5\' 4"  (1.626 m), weight 77.1 kg, SpO2 100 %, currently breastfeeding. Vitals:   01/20/21 1929 01/20/21 2305 01/21/21 0355 01/21/21 0742  BP: (!) 149/92 133/79 129/79 121/80  Pulse: (!) 110 86 77 86  Resp: 18 18 18 16   Temp: 98.9 F (37.2 C) 98.5 F (36.9 C) 98.2 F (36.8 C) 98.1 F (36.7 C)  TempSrc: Oral Oral Oral Oral  SpO2: 98% 97% 99% 100%  Weight:      Height:       Physical Examination: Gen: no acute distress Skin: warm and dry Abd: gravid, soft and nontender GU: deferred Ext: no edema, no calf tenderness Psych: mood appropriate  Fetal Monitoring:  Baseline: 120 bpm, Variability: moderate, Accelerations: present and Decelerations: Absent  Toco: no contractions Final Diagnosis:  Reactive NST  Labs:  No results found for this or any previous visit (from the past 24 hour(s)).  Imaging Studies:    US MFM OB DETAIL +14 WK  Result Date: 01/19/2021 ----------------------------------------------------------------------  OBSTETRICS REPORT                       (Signed Final 01/19/2021 12:53 pm) ---------------------------------------------------------------------- Patient Info  ID #:       [id number]                          D.O.B.:  1985/11/29 (34 yrs)  Name:       Marga MelnickLASHONDA M Schools               Visit Date:  01/19/2021 12:07 pm ---------------------------------------------------------------------- Performed By  Attending:        Lin Landsmanorenthian Booker      Secondary Phy.:   Judye BosJAMILLA R                    MD                                                             Dan HumphreysWALKER CNM  Performed By:     Marcellina MillinKelly L Moser          Address[facility]          [facility]                                                             [facility]  Referred By:      [facility] MAU/Triage         [facility] ---------------------------------------------------------------------- Orders  #  Description                           Code        Ordered By  1  [place] MFM OB DETAIL +14 WK               [id number]    Edd Arbour ----------------------------------------------------------------------  #  Order #                     Accession #                Episode #  1  [id number]                   [id number]                 [id number] ---------------------------------------------------------------------- Indications  Vaginal bleeding in pregnancy, third trimester O46.93  [redacted] weeks gestation of pregnancy                Z3A.34  Insufficient Prenatal Care                     O09.30  Fetal abnormality - other known or suspected   O35.9XX0  2 vessel umbilical cord                        O69.89X0 ---------------------------------------------------------------------- Fetal Evaluation  Num Of Fetuses:         1  Fetal Heart Rate(bpm):  150  Cardiac Activity:       Observed  Presentation:           Cephalic  Placenta:               Anterior  P. Cord Insertion:      Visualized  Amniotic Fluid  AFI FV:      Within normal limits  AFI Sum(cm)     %Tile       Largest Pocket(cm)  16.99           62          7.56  RUQ(cm)       RLQ(cm)       LUQ(cm)        LLQ(cm)  4.75          7.56          2.15           2.53   Comment:    No placental abruption or previa identified. ----------------------------------------------------------------------  Biometry  BPD:      86.2  mm     G. Age:  34w 5d         67  %    CI:        78.29   %    70 - 86                                                          FL/HC:      20.5   %    19.4 - 21.8  HC:      308.2  mm     G. Age:  34w 3d         21  %    HC/AC:      1.05        0.96 - 1.11  AC:      294.6  mm     G. Age:  33w 3d         33  %    FL/BPD:     73.3   %    71 - 87  FL:       63.2  mm     G. Age:  32w 5d         10  %    FL/AC:      21.5   %    20 - 24  HUM:        55  mm     G. Age:  32w 0d         15  %  Est. FW:    2197  gm    4 lb 13 oz      24  % ---------------------------------------------------------------------- OB History  Gravidity:    4         Term:   1        Prem:   2        SAB:   0  TOP:          0       Ectopic:  0        Living: 3 ---------------------------------------------------------------------- Gestational Age  U/S Today:     33w 6d                                        EDD:   03/03/21  Best:          34w 1d     Det. By:  U/S  (12/19/20)          EDD:   03/01/21 ---------------------------------------------------------------------- Anatomy  Cranium:               Appears normal         Aortic Arch:            Appears normal  Cavum:                 Not well visualized    Ductal Arch:            Not well visualized  Ventricles:            Appears normal         Diaphragm:  Appears normal  Choroid Plexus:        Not well visualized    Stomach:                Appears normal, left                                                                        sided  Cerebellum:            Appears normal         Abdomen:                Appears normal  Posterior Fossa:       Appears normal         Abdominal Wall:         Not well visualized  Nuchal Fold:           Not applicable (>20    Cord Vessels:           2 Vessel Cord                         wks GA)  Face:                   Orbits nl; profile not Kidneys:                Absent left kidney                         well visualized  Lips:                  Not well visualized    Bladder:                Appears normal  Thoracic:              Appears normal         Spine:                  Not well visualized  Heart:                 Appears normal         Upper Extremities:      Visualized                         (4CH, axis, and                         situs)  RVOT:                  Not well visualized    Lower Extremities:      Visualized  LVOT:                  Appears normal  Other:  Nasal bone visualized. 3VV and 3VTV visualized. Technically difficult          due to advanced gestational age. ---------------------------------------------------------------------- Cervix Uterus Adnexa  Cervix  Not visualized (advanced GA >24wks)  Adnexa  No abnormality visualized. ---------------------------------------------------------------------- Impression  Single intrauterine pregnancy here for a detailed anatomy  due to  single umbilical artery and absent left kidney  Ms. Coverdale presents with vaginal bleeding at L&D today  Normal anatomy with measurements consistent with dates  There is good fetal movement and amniotic fluid volume  Suboptimal views of the fetal anatomy were obtained  secondary to fetal position.  There is no evidence of placental abruption or previa.  There is no evidence of a left kidney there is a lying adrenal  gland, absent left renal artery. There is no evidence of a  pelvic kidney. ---------------------------------------------------------------------- Recommendations  Follow up growth in 4 weeks  Fetal echocardiogram recommended at first available  appointment  Genetic screening via cell free DNA if not previously  performed. ----------------------------------------------------------------------               Lin Landsman, MD Electronically Signed Final Report   01/19/2021 12:53 pm  ----------------------------------------------------------------------    Medications:  Scheduled . docusate sodium  100 mg Oral Daily  . prenatal multivitamin  1 tablet Oral Q1200  . sodium chloride flush  3 mL Intravenous Q12H   I have reviewed the patient's current medications.  ASSESSMENT: Principal Problem:   Vaginal bleeding in pregnancy, third trimester Active Problems:   Late prenatal care, antepartum   [redacted] weeks gestation of pregnancy  PLAN: 34yo [id number]@ [redacted]w[redacted]d admitted due to vaginal bleeding 1) Vaginal bleeding - No further bleeding since admission, no evidence of preterm labor - Plan for in-house monitoring for 5 days- if no further bleeding or other acute concerns, plan for discharge home at that time (3/23).  Pt does have some social concerns about the care of her other children- letter written for partner to confirm that she has been admitted.   2) Fetal well being - Category 1 FHR tracing - Continue NST daily - s/p betamethasone x 2   Jaynie Collins, MD 01/21/2021,9:17 AM

## 2021-01-22 LAB — PROTEIN / CREATININE RATIO, URINE
Creatinine, Urine: 320.95 mg/dL
Protein Creatinine Ratio: 0.29 mg/mg{Cre} — ABNORMAL HIGH (ref 0.00–0.15)
Total Protein, Urine: 92 mg/dL

## 2021-01-22 LAB — COMPREHENSIVE METABOLIC PANEL
ALT: 22 U/L (ref 0–44)
AST: 22 U/L (ref 15–41)
Albumin: 2.2 g/dL — ABNORMAL LOW (ref 3.5–5.0)
Alkaline Phosphatase: 134 U/L — ABNORMAL HIGH (ref 38–126)
Anion gap: 7 (ref 5–15)
BUN: 6 mg/dL (ref 6–20)
CO2: 20 mmol/L — ABNORMAL LOW (ref 22–32)
Calcium: 8.3 mg/dL — ABNORMAL LOW (ref 8.9–10.3)
Chloride: 107 mmol/L (ref 98–111)
Creatinine, Ser: 0.61 mg/dL (ref 0.44–1.00)
GFR, Estimated: >60 mL/min (ref >60)
Glucose, Bld: 84 mg/dL (ref 70–99)
Potassium: 3 mmol/L — ABNORMAL LOW (ref 3.5–5.1)
Sodium: 134 mmol/L — ABNORMAL LOW (ref 135–145)
Total Bilirubin: 0.5 mg/dL (ref 0.3–1.2)
Total Protein: 5.6 g/dL — ABNORMAL LOW (ref 6.5–8.1)

## 2021-01-22 LAB — CBC
HCT: 27.9 % — ABNORMAL LOW (ref 36.0–46.0)
Hemoglobin: 9.4 g/dL — ABNORMAL LOW (ref 12.0–15.0)
MCH: 29.7 pg (ref 26.0–34.0)
MCHC: 33.7 g/dL (ref 30.0–36.0)
MCV: 88.3 fL (ref 80.0–100.0)
Platelets: 153 10*3/uL (ref 150–400)
RBC: 3.16 MIL/uL — ABNORMAL LOW (ref 3.87–5.11)
RDW: 13.1 % (ref 11.5–15.5)
WBC: 10 10*3/uL (ref 4.0–10.5)
nRBC: 0 % (ref 0.0–0.2)

## 2021-01-22 NOTE — Plan of Care (Signed)
  Problem: Activity: Goal: Risk for activity intolerance will decrease Outcome: Progressing   Problem: Nutrition: Goal: Adequate nutrition will be maintained Outcome: Progressing   Problem: Coping: Goal: Level of anxiety will decrease Outcome: Progressing   Problem: Pain Managment: Goal: General experience of comfort will improve Outcome: Progressing   Problem: Education: Goal: Knowledge of disease or condition will improve Outcome: Progressing Goal: Knowledge of the prescribed therapeutic regimen will improve Outcome: Progressing   Problem: Clinical Measurements: Goal: Complications related to the disease process, condition or treatment will be avoided or minimized Outcome: Progressing

## 2021-01-22 NOTE — Progress Notes (Signed)
FACULTY PRACTICE ANTEPARTUM(COMPREHENSIVE) NOTE  LLUVIA GWYNNE is a 35 y.o. [phone] at  [redacted]w[redacted]d  who is admitted for vaginal bleeding.    Fetal presentation is cephalic. Length of Stay:  2  Days  Date of admission:01/19/2021  Subjective: No acute complaints overnight.  Patient reports the fetal movement as active. Patient reports uterine contraction  activity as none. Patient reports  vaginal bleeding as no bleeding since admission Patient describes fluid per vagina as none.   Vitals:  Blood pressure 124/79, pulse 80, temperature 98.5 F (36.9 C), temperature source Oral, resp. rate 18, height 5\' 4"  (1.626 m), weight 77.1 kg, SpO2 100 %, currently breastfeeding. Vitals:   01/21/21 1209 01/21/21 1750 01/21/21 2044 01/21/21 2305  BP: (!) 152/86 140/89 132/90 124/79  Pulse: 98 82 86 80  Resp: 18 18 18 18   Temp: 98.2 F (36.8 C) 98 F (36.7 C) 98.4 F (36.9 C) 98.5 F (36.9 C)  TempSrc: Oral Oral Oral Oral  SpO2: 100% 99% 100% 100%  Weight:      Height:       Physical Examination: Gen: no acute distress Skin: warm and dry Abd: gravid, soft and nontender GU: deferred Ext: no edema, no calf tenderness Psych: mood appropriate  Fetal Monitoring:  Baseline: 140 bpm, Variability: moderate, Accelerations: present and Decelerations: Absent  Toco: no contractions Final Diagnosis:  Reactive NST  Labs:  No results found for this or any previous visit (from the past 24 hour(s)).  Imaging Studies:    01/23/21 MFM OB DETAIL +14 WK  Result Date: 01/19/2021 ----------------------------------------------------------------------  OBSTETRICS REPORT                       (Signed Final 01/19/2021 12:53 pm) ---------------------------------------------------------------------- Patient Info  ID #:       01/21/2021                          D.O.B.:  10/02/1986 (34 yrs)  Name:       SHIQUITA COLLIGNON               Visit Date: 01/19/2021 12:07 pm  ---------------------------------------------------------------------- Performed By  Attending:        [name]      Secondary Phy.:   01/21/2021                    MD                                                             Lin Landsman CNM  Performed By:     Judye Bos          Address[facility]          [facility]  Referred By:      [facility] MAU/Triage         [facility] ---------------------------------------------------------------------- Orders  #  Description                           Code        Ordered By  1  [place] MFM OB DETAIL +14 WK               [id number]    Edd Arbour ----------------------------------------------------------------------  #  Order #                     Accession #                Episode #  1  [id number]                   [id number]                 [id number] ---------------------------------------------------------------------- Indications  Vaginal bleeding in pregnancy, third trimester O46.93  [redacted] weeks gestation of pregnancy                Z3A.34  Insufficient Prenatal Care                     O09.30  Fetal abnormality - other known or suspected   O35.9XX0  2 vessel umbilical cord                        O69.89X0 ---------------------------------------------------------------------- Fetal Evaluation  Num Of Fetuses:         1  Fetal Heart Rate(bpm):  150  Cardiac Activity:       Observed  Presentation:           Cephalic  Placenta:               Anterior  P. Cord Insertion:      Visualized  Amniotic Fluid  AFI FV:      Within normal limits  AFI Sum(cm)     %Tile       Largest Pocket(cm)  16.99           62          7.56  RUQ(cm)       RLQ(cm)       LUQ(cm)        LLQ(cm)  4.75          7.56          2.15           2.53  Comment:    No  placental abruption or previa identified. ---------------------------------------------------------------------- Biometry  BPD:      86.2  mm     G. Age:  34w 5d         67  %    CI:        78.29   %    70 - 86  FL/HC:      20.5   %    19.4 - 21.8  HC:      308.2  mm     G. Age:  34w 3d         21  %    HC/AC:      1.05        0.96 - 1.11  AC:      294.6  mm     G. Age:  33w 3d         33  %    FL/BPD:     73.3   %    71 - 87  FL:       63.2  mm     G. Age:  32w 5d         10  %    FL/AC:      21.5   %    20 - 24  HUM:        55  mm     G. Age:  32w 0d         15  %  Est. FW:    2197  gm    4 lb 13 oz      24  % ---------------------------------------------------------------------- OB History  Gravidity:    4         Term:   1        Prem:   2        SAB:   0  TOP:          0       Ectopic:  0        Living: 3 ---------------------------------------------------------------------- Gestational Age  U/S Today:     33w 6d                                        EDD:   03/03/21  Best:          34w 1d     Det. By:  U/S  (12/19/20)          EDD:   03/01/21 ---------------------------------------------------------------------- Anatomy  Cranium:               Appears normal         Aortic Arch:            Appears normal  Cavum:                 Not well visualized    Ductal Arch:            Not well visualized  Ventricles:            Appears normal         Diaphragm:              Appears normal  Choroid Plexus:        Not well visualized    Stomach:                Appears normal, left  sided  Cerebellum:            Appears normal         Abdomen:                Appears normal  Posterior Fossa:       Appears normal         Abdominal Wall:         Not well visualized  Nuchal Fold:           Not applicable (>20    Cord Vessels:           2 Vessel Cord                         wks GA)  Face:                   Orbits nl; profile not Kidneys:                Absent left kidney                         well visualized  Lips:                  Not well visualized    Bladder:                Appears normal  Thoracic:              Appears normal         Spine:                  Not well visualized  Heart:                 Appears normal         Upper Extremities:      Visualized                         (4CH, axis, and                         situs)  RVOT:                  Not well visualized    Lower Extremities:      Visualized  LVOT:                  Appears normal  Other:  Nasal bone visualized. 3VV and 3VTV visualized. Technically difficult          due to advanced gestational age. ---------------------------------------------------------------------- Cervix Uterus Adnexa  Cervix  Not visualized (advanced GA >24wks)  Adnexa  No abnormality visualized. ---------------------------------------------------------------------- Impression  Single intrauterine pregnancy here for a detailed anatomy  due to single umbilical artery and absent left kidney  Ms. Paz presents with vaginal bleeding at L&D today  Normal anatomy with measurements consistent with dates  There is good fetal movement and amniotic fluid volume  Suboptimal views of the fetal anatomy were obtained  secondary to fetal position.  There is no evidence of placental abruption or previa.  There is no evidence of a left kidney there is a lying adrenal  gland, absent left renal artery. There is no evidence of a  pelvic kidney. ---------------------------------------------------------------------- Recommendations  Follow up growth in 4 weeks  Fetal echocardiogram recommended at first available  appointment  Genetic  screening via cell free DNA if not previously  performed. ----------------------------------------------------------------------               Lin Landsman, MD Electronically Signed Final Report   01/19/2021 12:53 pm  ----------------------------------------------------------------------    Medications:  Scheduled . docusate sodium  100 mg Oral Daily  . prenatal vitamin w/FE, FA  1 tablet Oral Q1200  . sodium chloride flush  3 mL Intravenous Q12H   I have reviewed the patient's current medications.  ASSESSMENT: Principal Problem:   Vaginal bleeding in pregnancy, third trimester Active Problems:   Late prenatal care, antepartum   [redacted] weeks gestation of pregnancy  PLAN: 34yo [id number]@ [redacted]w[redacted]d admitted due to vaginal bleeding 1) Vaginal bleeding - No further bleeding since admission, no evidence of preterm labor - Plan for in-house monitoring for 5 days- if no further bleeding or other acute concerns, plan for discharge home at that time (3/23).  Pt does have some social concerns about the care of her other children- letter written for partner to confirm that she has been admitted.   2) Elevated BP, h/o preeclamosia - Will check labs today, denies any symptoms  3) Fetal well being - Category 1 FHR tracing - Continue NST daily - s/p betamethasone x 2   Jaynie Collins, MD 01/22/2021,7:58 AM

## 2021-01-22 NOTE — Progress Notes (Signed)
CSW followed up with patient at bedside. CSW inquired about how patient was doing, patient reported that she was doing good. Patient provided an update about her plan of care and is hopeful to discharge on Wednesday. CSW positively affirmed MOB's improvement in mood. CSW inquired about any needs/concerns. Patient reported that she needed nipple pads and denied any additional needs/concerns. CSW agreed to check into getting patient nipple pads. Patient shared that she was about to leave the room. CSW agreed to follow up with patient tomorrow to see how she is doing, patient agreeable.    CSW got nipple pads from lactation and placed in patient's room.   CSW will continue to offer support while patient is admitted.   Celso Sickle, LCSW Clinical Social Worker [facility] Cell#: [phone]

## 2021-01-23 ENCOUNTER — Telehealth (HOSPITAL_COMMUNITY): Payer: Self-pay | Admitting: *Deleted

## 2021-01-23 DIAGNOSIS — O0933 Supervision of pregnancy with insufficient antenatal care, third trimester: Secondary | ICD-10-CM

## 2021-01-23 MED ORDER — FERROUS SULFATE 325 (65 FE) MG PO TABS
325.0000 mg | ORAL_TABLET | Freq: Two times a day (BID) | ORAL | Status: DC
  Administered 2021-01-23 – 2021-01-24 (×2): 325 mg via ORAL
  Filled 2021-01-23 (×2): qty 1

## 2021-01-23 NOTE — Telephone Encounter (Signed)
Preadmission screen  

## 2021-01-23 NOTE — Progress Notes (Signed)
FACULTY PRACTICE ANTEPARTUM PROGRESS NOTE  Vanessa Wise is a 35 y.o. [phone] at [redacted]w[redacted]d who is admitted for vaginal bleeding in third trimester..  Estimated Date of Delivery: 02/27/21 Fetal presentation is cephalic.  Length of Stay:  3 Days. Admitted 01/19/2021  Subjective: Pt seen.  Doing well. Patient reports normal fetal movement.  She denies uterine contractions, denies bleeding and leaking of fluid per vagina.  Pt denies headache, visual changes or RUQ pain.  Vitals:  Blood pressure 130/78, pulse 75, temperature 98.3 F (36.8 C), temperature source Oral, resp. rate 16, height 5\' 4"  (1.626 m), weight 77.1 kg, SpO2 98 %, currently breastfeeding. Physical Examination: CONSTITUTIONAL: Well-developed, well-nourished female in no acute distress.  HENT:  Normocephalic, atraumatic, External right and left ear normal. Oropharynx is clear and moist EYES: Conjunctivae and EOM are normal. Pupils are equal, round, and reactive to light. No scleral icterus.  NECK: Normal range of motion, supple, no masses. SKIN: Skin is warm and dry. No rash noted. Not diaphoretic. No erythema. No pallor. NEUROLGIC: Alert and oriented to person, place, and time. Normal reflexes, muscle tone coordination. No cranial nerve deficit noted. PSYCHIATRIC: Normal mood and affect. Normal behavior. Normal judgment and thought content. CARDIOVASCULAR: Normal heart rate noted, regular rhythm RESPIRATORY: Effort and breath sounds normal, no problems with respiration noted MUSCULOSKELETAL: Normal range of motion. No edema and no tenderness. ABDOMEN: Soft, nontender, nondistended, gravid. CERVIX: deferred  Fetal monitoring: FHR: 140-143 bpm, Variability: moderate, Accelerations: Present, Decelerations: Absent  Uterine activity: occasional contraction  Results for orders placed or performed during the hospital encounter of 01/19/21 (from the past 48 hour(s))  CBC     Status: Abnormal   Collection Time: 01/22/21  8:19 AM   Result Value Ref Range   WBC 10.0 4.0 - 10.5 K/uL   RBC 3.16 (L) 3.87 - 5.11 MIL/uL   Hemoglobin 9.4 (L) 12.0 - 15.0 g/dL   HCT 01/24/21 (L) 49.6 - 75.9 %   MCV 88.3 80.0 - 100.0 fL   MCH 29.7 26.0 - 34.0 pg   MCHC 33.7 30.0 - 36.0 g/dL   RDW 16.3 84.6 - 65.9 %   Platelets 153 150 - 400 K/uL   nRBC 0.0 0.0 - 0.2 %    Comment: Performed at [facility], [address], [place], [place]  Comprehensive metabolic panel     Status: Abnormal   Collection Time: 01/22/21  8:19 AM  Result Value Ref Range   Sodium 134 (L) 135 - 145 mmol/L   Potassium 3.0 (L) 3.5 - 5.1 mmol/L   Chloride 107 98 - 111 mmol/L   CO2 20 (L) 22 - 32 mmol/L   Glucose, Bld 84 70 - 99 mg/dL    Comment: Glucose reference range applies only to samples taken after fasting for at least 8 hours.   BUN 6 6 - 20 mg/dL   Creatinine, Ser 01/24/21 0.44 - 1.00 mg/dL   Calcium 8.3 (L) 8.9 - 10.3 mg/dL   Total Protein 5.6 (L) 6.5 - 8.1 g/dL   Albumin 2.2 (L) 3.5 - 5.0 g/dL   AST 22 15 - 41 U/L   ALT 22 0 - 44 U/L   Alkaline Phosphatase 134 (H) 38 - 126 U/L   Total Bilirubin 0.5 0.3 - 1.2 mg/dL   GFR, Estimated 9.39 >03 mL/min    Comment: (NOTE) Calculated using the CKD-EPI Creatinine Equation (2021)    Anion gap 7 5 - 15    Comment: Performed at  [facility], [address] [place], [place] [address]  Protein / creatinine ratio, urine     Status: Abnormal   Collection Time: 01/22/21 12:00 PM  Result Value Ref Range   Creatinine, Urine 320.95 mg/dL   Total Protein, Urine 92 mg/dL    Comment: NO NORMAL RANGE ESTABLISHED FOR THIS TEST   Protein Creatinine Ratio 0.29 (H) 0.00 - 0.15 mg/mg[Cre]    Comment: Performed at [facility], [address] [place], [place] [address]    I have reviewed the patient's current medications.  ASSESSMENT: Principal Problem:   Vaginal bleeding in pregnancy, third trimester Active Problems:   Late prenatal care, antepartum   [redacted] weeks gestation of  pregnancy   PLAN: 1. Vaginal bleeding in third trimester, no bleeding since admission.  D/c home today or tomorrow.  2. Hypertension pregnancy: BP normal this morning.  Labs WNL, P/C ratio borderline at 0.29, ? Gestational hypertensive. Will discuss with Dr. Jolayne Panther concerning discharge.   Continue routine antenatal care.   Mariel Aloe, MD [facility] Faculty Attending, [facility] 01/23/2021 7:37 AM

## 2021-01-23 NOTE — Discharge Summary (Signed)
Physician Discharge Summary  Patient ID: Vanessa Wise MRN: [id number] DOB/AGE: 1986-10-23 35 y.o.  Admit date: 01/19/2021 Discharge date: 01/24/2021  Admission Diagnoses: Third trimester vaginal bleeding  Discharge Diagnoses:  Principal Problem:   Vaginal bleeding in pregnancy, third trimester Active Problems:   Late prenatal care, antepartum   [redacted] weeks gestation of pregnancy   Discharged Condition: good  Hospital Course: Patient admitted on 01/19/21 with third trimester vaginal bleeding. Patient remained inpatient for observation. The vaginal bleeding stopped spontaneously and maternal/fetal status remained stable. Patient was anxious to be discharged and was not able to stay for a full 7 day observation. Patient found to be stable on HD#5. Discharge instructions were reviewed with the patient. In addition, patient was found to have elevated blood pressure during her admission with normal labs. Patient now with diagnosis of GHTN with plans for IOL on 02/06/21 at 37 weeks. Labor and delivery already contacted. Patient desires to [facility]  Consults: None  Significant Diagnostic Studies:  [place] MFM OB DETAIL +14 WK  Result Date: 01/19/2021 ----------------------------------------------------------------------  OBSTETRICS REPORT                       (Signed Final 01/19/2021 12:53 pm) ---------------------------------------------------------------------- Patient Info  ID #:       [id number]                          D.O.B.:  Dec 09, 1985 (34 yrs)  Name:       Vanessa Wise               Visit Date: 01/19/2021 12:07 pm ---------------------------------------------------------------------- Performed By  Attending:        Lin Landsman      Secondary Phy.:   Judye Bos                    MD                                                             Dan Humphreys CNM  Performed By:     Marcellina Millin          Address:          [facility]  Referred By:      [facility] MAU/Triage         [facility] ---------------------------------------------------------------------- Orders  #  Description                           Code        Ordered By  1  [place] MFM OB DETAIL +14 WK               60630.16    Edd Arbour ----------------------------------------------------------------------  #  Order #                     Accession #                Episode #  1  [id number]                   [id number]                 [id number] ---------------------------------------------------------------------- Indications  Vaginal bleeding in pregnancy, third trimester O46.93  [redacted] weeks gestation of pregnancy                Z3A.34  Insufficient Prenatal Care                     O09.30  Fetal abnormality - other known or suspected   O35.9XX0  2 vessel umbilical cord                        O69.89X0 ---------------------------------------------------------------------- Fetal Evaluation  Num Of Fetuses:         1  Fetal Heart Rate(bpm):  150  Cardiac Activity:       Observed  Presentation:           Cephalic  Placenta:               Anterior  P. Cord Insertion:      Visualized  Amniotic Fluid  AFI FV:      Within normal limits  AFI Sum(cm)     %Tile       Largest Pocket(cm)  16.99           62          7.56  RUQ(cm)       RLQ(cm)       LUQ(cm)        LLQ(cm)  4.75          7.56          2.15           2.53  Comment:    No placental abruption or previa identified. ---------------------------------------------------------------------- Biometry  BPD:      86.2  mm     G. Age:  34w 5d         67  %    CI:        78.29   %    70 - 86                                                          FL/HC:      20.5   %    19.4 - 21.8  HC:      308.2  mm     G. Age:  75w 3d  21  %    HC/AC:      1.05        0.96 -  1.11  AC:      294.6  mm     G. Age:  33w 3d         33  %    FL/BPD:     73.3   %    71 - 87  FL:       63.2  mm     G. Age:  32w 5d         10  %    FL/AC:      21.5   %    20 - 24  HUM:        55  mm     G. Age:  32w 0d         15  %  Est. FW:    2197  gm    4 lb 13 oz      24  % ---------------------------------------------------------------------- OB History  Gravidity:    4         Term:   1        Prem:   2        SAB:   0  TOP:          0       Ectopic:  0        Living: 3 ---------------------------------------------------------------------- Gestational Age  U/S Today:     33w 6d                                        EDD:   03/03/21  Best:          34w 1d     Det. By:  U/S  (12/19/20)          EDD:   03/01/21 ---------------------------------------------------------------------- Anatomy  Cranium:               Appears normal         Aortic Arch:            Appears normal  Cavum:                 Not well visualized    Ductal Arch:            Not well visualized  Ventricles:            Appears normal         Diaphragm:              Appears normal  Choroid Plexus:        Not well visualized    Stomach:                Appears normal, left                                                                        sided  Cerebellum:            Appears normal         Abdomen:  Appears normal  Posterior Fossa:       Appears normal         Abdominal Wall:         Not well visualized  Nuchal Fold:           Not applicable (>20    Cord Vessels:           2 Vessel Cord                         wks GA)  Face:                  Orbits nl; profile not Kidneys:                Absent left kidney                         well visualized  Lips:                  Not well visualized    Bladder:                Appears normal  Thoracic:              Appears normal         Spine:                  Not well visualized  Heart:                 Appears normal         Upper Extremities:      Visualized                          (4CH, axis, and                         situs)  RVOT:                  Not well visualized    Lower Extremities:      Visualized  LVOT:                  Appears normal  Other:  Nasal bone visualized. 3VV and 3VTV visualized. Technically difficult          due to advanced gestational age. ---------------------------------------------------------------------- Cervix Uterus Adnexa  Cervix  Not visualized (advanced GA >24wks)  Adnexa  No abnormality visualized. ---------------------------------------------------------------------- Impression  Single intrauterine pregnancy here for a detailed anatomy  due to single umbilical artery and absent left kidney  Ms. Duffus presents with vaginal bleeding at L&D today  Normal anatomy with measurements consistent with dates  There is good fetal movement and amniotic fluid volume  Suboptimal views of the fetal anatomy were obtained  secondary to fetal position.  There is no evidence of placental abruption or previa.  There is no evidence of a left kidney there is a lying adrenal  gland, absent left renal artery. There is no evidence of a  pelvic kidney. ---------------------------------------------------------------------- Recommendations  Follow up growth in 4 weeks  Fetal echocardiogram recommended at first available  appointment  Genetic screening via cell free DNA if not previously  performed. ----------------------------------------------------------------------               Lin Landsman, MD Electronically Signed Final Report   01/19/2021 12:53 pm ----------------------------------------------------------------------  Discharge Exam: Blood pressure (!) 143/87, pulse (!) 59, temperature 98.1 F (36.7 C), temperature source Oral, resp. rate 18, height  (1.626 m), weight 77.1 kg, SpO2 100 %, currently breastfeeding. GENERAL: Well-developed, well-nourished female in no acute distress.  LUNGS: Clear to auscultation bilaterally.  HEART: Regular rate and  rhythm. ABDOMEN: Soft, nontender, gravid PELVIC: Not indicated EXTREMITIES: No cyanosis, clubbing, or edema, 2+ distal pulses.  FHT: baseline 140, mod variability, +accels, no decels Toco: irregular contractions  Disposition:  Discharge disposition: 01-Home or Self Care        Allergies as of 01/24/2021   No Known Allergies     Medication List    TAKE these medications   acetaminophen 500 MG tablet Commonly known as: TYLENOL Take 1,000 mg by mouth every 6 (six) hours as needed for mild pain or headache.   aspirin EC 81 MG tablet Take 1 tablet (81 mg total) by mouth daily. Take after 12 weeks for prevention of preeclampsia later in pregnancy   calcium carbonate 500 MG chewable tablet Commonly known as: TUMS - dosed in mg elemental calcium Chew 4 tablets by mouth daily as needed for indigestion or heartburn.   ferrous sulfate 325 (65 FE) MG tablet Take 1 tablet (325 mg total) by mouth 2 (two) times daily with a meal.   pantoprazole 20 MG tablet Commonly known as: Protonix Take 1 tablet (20 mg total) by mouth daily.   promethazine 12.5 MG tablet Commonly known as: PHENERGAN Take 1 tablet (12.5 mg total) by mouth every 6 (six) hours as needed for nausea or vomiting.   Vitafol Gummies 3.33-0.333-34.8 MG Chew Chew 1 tablet by mouth daily.       Follow-up Information    [facility] Follow up.   Why: An appointment will be scheduled for you to be seen next week in the office Contact information: [address] [place] [address] [phone]              Signed: Catalina Antigua 01/24/2021, 9:46 AM

## 2021-01-23 NOTE — Discharge Instructions (Signed)
Vaginal Bleeding During Pregnancy, Third Trimester A small amount of bleeding from the vagina, or spotting, is common during pregnancy. Sometimes bleeding is normal and is not a problem. However, bleeding during the third trimester can also be a sign of something serious for the mother and the baby. Some normal things may cause bleeding or spotting during the third trimester. They include:  Rapid changes in blood vessels. This is caused by changes that are happening to the body during pregnancy.  Sex.  Pelvic exams. Some abnormal causes of vaginal bleeding during the third trimester include:  Infection in the cervix.  Growths on the cervix. The growths on the cervix are also called polyps.  A condition in which the placenta partially or completely covers the opening of the cervix inside the uterus (placenta previa).  The placenta separating from the uterus (placenta abruption).  Early labor, also called preterm labor.  A condition in which the placenta grows into the muscle of the uterus (placenta accreta). Tell your health care provider about any vaginal bleeding right away. Follow these instructions at home: Monitoring your bleeding  Pay attention to any changes in your symptoms. Let your health care provider know about any concerns.  Try to understand when the bleeding occurs. Does the bleeding start on its own, or does it start after something is done, such as sex or a pelvic exam?  Use a diary to record the things you see about your bleeding, including: ? The kind of bleeding you are having. Does the bleeding start and stop irregularly, or is it a Rahm Minix flow? ? The severity of the bleeding. Is the bleeding heavy or light? ? The number of pads you use each day, how often you change them, and how soaked they are.  Tell your health care provider if you pass tissue. He or she may want to see it.   Activity  Follow instructions from your health care provider about limiting  your activity. If your health care provider recommends activity restriction, you may need to stay in bed and only get up to use the bathroom. In some cases, your health care provider may allow you to continue light activity.  Ask your health care provider if it is safe for you to drive.  Do not lift anything that is heavier than 10 lb (4.5 kg), or the limit that you are told, until your health care provider says that it is safe.  Do not have sex until your health care provider says that this is safe.  If needed, make plans for someone to help with your regular activities. Medicines  Take over-the-counter and prescription medicines only as told by your health care provider.  Do not take aspirin because it can cause bleeding. General instructions  Do not use tampons or douche.  Keep all follow-up visits. This is important. Contact a health care provider if:  You have vaginal bleeding during any part of your pregnancy.  You have cramps or labor pains.  You have a fever. Get help right away if:  You have severe cramps or pain in your back or abdomen.  You have a gush of fluid from the vagina.  Your bleeding increases or you pass large clots or a large amount of tissue from your vagina.  You feel light-headed or weak, or you faint.  You feel that your baby is moving less than usual, or not moving at all. Summary  Some normal things can cause bleeding or spotting in pregnancy.  Bleeding during the third trimester can be a sign of a serious problem for the mother and the baby.  Be sure to tell your health care provider about any vaginal bleeding right away. This information is not intended to replace advice given to you by your health care provider. Make sure you discuss any questions you have with your health care provider. Document Revised: 07/13/2020 Document Reviewed: 07/13/2020 Elsevier Patient Education  2021 [employer].

## 2021-01-24 MED ORDER — FERROUS SULFATE 325 (65 FE) MG PO TABS: 325.0000 mg | ORAL_TABLET | Freq: Two times a day (BID) | ORAL | 3 refills | Status: DC

## 2021-01-24 NOTE — Plan of Care (Signed)
  Problem: Education: Goal: Knowledge of General Education information will improve Description: Including pain rating scale, medication(s)/side effects and non-pharmacologic comfort measures Outcome: Adequate for Discharge   Problem: Health Behavior/Discharge Planning: Goal: Ability to manage health-related needs will improve Outcome: Adequate for Discharge   Problem: Clinical Measurements: Goal: Ability to maintain clinical measurements within normal limits will improve Outcome: Adequate for Discharge Goal: Cardiovascular complication will be avoided Outcome: Adequate for Discharge   Problem: Activity: Goal: Risk for activity intolerance will decrease Outcome: Adequate for Discharge   Problem: Nutrition: Goal: Adequate nutrition will be maintained Outcome: Adequate for Discharge   Problem: Coping: Goal: Level of anxiety will decrease Outcome: Adequate for Discharge   Problem: Pain Managment: Goal: General experience of comfort will improve Outcome: Adequate for Discharge   Problem: Education: Goal: Knowledge of disease or condition will improve Outcome: Adequate for Discharge Goal: Knowledge of the prescribed therapeutic regimen will improve Outcome: Adequate for Discharge   Problem: Clinical Measurements: Goal: Complications related to the disease process, condition or treatment will be avoided or minimized Outcome: Adequate for Discharge

## 2021-01-25 ENCOUNTER — Encounter: Payer: Medicaid Other | Admitting: Obstetrics and Gynecology

## 2021-01-26 ENCOUNTER — Ambulatory Visit: Payer: Medicaid Other

## 2021-01-26 ENCOUNTER — Other Ambulatory Visit: Payer: Medicaid Other

## 2021-01-31 ENCOUNTER — Encounter: Payer: Medicaid Other | Admitting: Obstetrics & Gynecology

## 2021-02-01 ENCOUNTER — Encounter: Payer: Medicaid Other | Admitting: Obstetrics & Gynecology

## 2021-02-06 ENCOUNTER — Encounter: Payer: Medicaid Other | Admitting: Obstetrics and Gynecology

## 2021-02-06 ENCOUNTER — Encounter (HOSPITAL_COMMUNITY): Payer: Medicaid Other

## 2021-02-07 ENCOUNTER — Other Ambulatory Visit: Payer: Self-pay

## 2021-02-07 ENCOUNTER — Inpatient Hospital Stay (HOSPITAL_COMMUNITY)
Admission: AD | Admit: 2021-02-07 | Discharge: 2021-02-09 | DRG: 788 | Disposition: A | Discharge status: Home / Self Care | Payer: Medicaid Other | Attending: Obstetrics and Gynecology | Admitting: Obstetrics and Gynecology

## 2021-02-07 ENCOUNTER — Encounter (HOSPITAL_COMMUNITY)
Admission: AD | Disposition: A | Discharge status: Home / Self Care | Payer: Self-pay | Source: Home / Self Care | Attending: Obstetrics and Gynecology

## 2021-02-07 ENCOUNTER — Inpatient Hospital Stay (HOSPITAL_COMMUNITY): Payer: Medicaid Other | Admitting: Anesthesiology

## 2021-02-07 ENCOUNTER — Inpatient Hospital Stay (HOSPITAL_COMMUNITY): Payer: Medicaid Other

## 2021-02-07 ENCOUNTER — Encounter (HOSPITAL_COMMUNITY): Payer: Self-pay | Admitting: Obstetrics and Gynecology

## 2021-02-07 DIAGNOSIS — O0990 Supervision of high risk pregnancy, unspecified, unspecified trimester: Secondary | ICD-10-CM

## 2021-02-07 DIAGNOSIS — O99334 Smoking (tobacco) complicating childbirth: Secondary | ICD-10-CM | POA: Diagnosis present

## 2021-02-07 DIAGNOSIS — O358XX0 Maternal care for other (suspected) fetal abnormality and damage, not applicable or unspecified: Secondary | ICD-10-CM | POA: Diagnosis present

## 2021-02-07 DIAGNOSIS — O34219 Maternal care for unspecified type scar from previous cesarean delivery: Secondary | ICD-10-CM

## 2021-02-07 DIAGNOSIS — Z3A37 37 weeks gestation of pregnancy: Secondary | ICD-10-CM

## 2021-02-07 DIAGNOSIS — O1404 Mild to moderate pre-eclampsia, complicating childbirth: Secondary | ICD-10-CM | POA: Diagnosis present

## 2021-02-07 DIAGNOSIS — K0889 Other specified disorders of teeth and supporting structures: Secondary | ICD-10-CM | POA: Diagnosis present

## 2021-02-07 DIAGNOSIS — Z98891 History of uterine scar from previous surgery: Secondary | ICD-10-CM

## 2021-02-07 DIAGNOSIS — F1290 Cannabis use, unspecified, uncomplicated: Secondary | ICD-10-CM | POA: Diagnosis present

## 2021-02-07 DIAGNOSIS — O134 Gestational [pregnancy-induced] hypertension without significant proteinuria, complicating childbirth: Secondary | ICD-10-CM | POA: Diagnosis present

## 2021-02-07 DIAGNOSIS — O321XX0 Maternal care for breech presentation, not applicable or unspecified: Secondary | ICD-10-CM | POA: Diagnosis present

## 2021-02-07 DIAGNOSIS — F17210 Nicotine dependence, cigarettes, uncomplicated: Secondary | ICD-10-CM | POA: Diagnosis present

## 2021-02-07 DIAGNOSIS — O34211 Maternal care for low transverse scar from previous cesarean delivery: Secondary | ICD-10-CM | POA: Diagnosis present

## 2021-02-07 DIAGNOSIS — O99324 Drug use complicating childbirth: Secondary | ICD-10-CM | POA: Diagnosis present

## 2021-02-07 DIAGNOSIS — O99892 Other specified diseases and conditions complicating childbirth: Secondary | ICD-10-CM | POA: Diagnosis present

## 2021-02-07 DIAGNOSIS — Z20822 Contact with and (suspected) exposure to covid-19: Secondary | ICD-10-CM | POA: Diagnosis present

## 2021-02-07 LAB — COMPREHENSIVE METABOLIC PANEL
ALT: 14 U/L (ref 0–44)
AST: 21 U/L (ref 15–41)
Albumin: 2.4 g/dL — ABNORMAL LOW (ref 3.5–5.0)
Alkaline Phosphatase: 183 U/L — ABNORMAL HIGH (ref 38–126)
Anion gap: 8 (ref 5–15)
BUN: 7 mg/dL (ref 6–20)
CO2: 22 mmol/L (ref 22–32)
Calcium: 8.7 mg/dL — ABNORMAL LOW (ref 8.9–10.3)
Chloride: 107 mmol/L (ref 98–111)
Creatinine, Ser: 0.8 mg/dL (ref 0.44–1.00)
GFR, Estimated: >60 mL/min (ref >60)
Glucose, Bld: 117 mg/dL — ABNORMAL HIGH (ref 70–99)
Potassium: 2.9 mmol/L — ABNORMAL LOW (ref 3.5–5.1)
Sodium: 137 mmol/L (ref 135–145)
Total Bilirubin: 0.4 mg/dL (ref 0.3–1.2)
Total Protein: 6.1 g/dL — ABNORMAL LOW (ref 6.5–8.1)

## 2021-02-07 LAB — PROTEIN / CREATININE RATIO, URINE
Creatinine, Urine: 447.99 mg/dL
Protein Creatinine Ratio: 1.13 mg/mg{Cre} — ABNORMAL HIGH (ref 0.00–0.15)
Total Protein, Urine: 505 mg/dL

## 2021-02-07 LAB — CBC
HCT: 30.2 % — ABNORMAL LOW (ref 36.0–46.0)
HCT: 32 % — ABNORMAL LOW (ref 36.0–46.0)
Hemoglobin: 10.2 g/dL — ABNORMAL LOW (ref 12.0–15.0)
Hemoglobin: 10.6 g/dL — ABNORMAL LOW (ref 12.0–15.0)
MCH: 29.6 pg (ref 26.0–34.0)
MCH: 29.7 pg (ref 26.0–34.0)
MCHC: 33.1 g/dL (ref 30.0–36.0)
MCHC: 33.8 g/dL (ref 30.0–36.0)
MCV: 87.5 fL (ref 80.0–100.0)
MCV: 89.6 fL (ref 80.0–100.0)
Platelets: 149 10*3/uL — ABNORMAL LOW (ref 150–400)
Platelets: 153 10*3/uL (ref 150–400)
RBC: 3.45 MIL/uL — ABNORMAL LOW (ref 3.87–5.11)
RBC: 3.57 MIL/uL — ABNORMAL LOW (ref 3.87–5.11)
RDW: 13.8 % (ref 11.5–15.5)
RDW: 13.8 % (ref 11.5–15.5)
WBC: 7.7 10*3/uL (ref 4.0–10.5)
WBC: 7.7 10*3/uL (ref 4.0–10.5)
nRBC: 0 % (ref 0.0–0.2)
nRBC: 0 % (ref 0.0–0.2)

## 2021-02-07 LAB — TYPE AND SCREEN
ABO/RH(D): O POS
Antibody Screen: NEGATIVE

## 2021-02-07 LAB — RESP PANEL BY RT-PCR (FLU A&B, COVID) ARPGX2
Influenza A by PCR: NEGATIVE
Influenza B by PCR: NEGATIVE
SARS Coronavirus 2 by RT PCR: NEGATIVE

## 2021-02-07 LAB — RPR: RPR Ser Ql: NONREACTIVE

## 2021-02-07 SURGERY — Surgical Case
Anesthesia: Epidural | Wound class: Clean Contaminated

## 2021-02-07 MED ORDER — SODIUM CHLORIDE 0.9 % IR SOLN
Status: DC | PRN
  Administered 2021-02-07: 1000 mL

## 2021-02-07 MED ORDER — LIDOCAINE HCL (PF) 1 % IJ SOLN
INTRAMUSCULAR | Status: DC | PRN
  Administered 2021-02-07: 5 mL via EPIDURAL

## 2021-02-07 MED ORDER — LACTATED RINGERS IV SOLN
500.0000 mL | Freq: Once | INTRAVENOUS | Status: AC
  Administered 2021-02-07: 500 mL via INTRAVENOUS

## 2021-02-07 MED ORDER — OXYTOCIN BOLUS FROM INFUSION: 333.0000 mL | Freq: Once | INTRAVENOUS | Status: DC

## 2021-02-07 MED ORDER — SODIUM CHLORIDE 0.9 % IV SOLN
INTRAVENOUS | Status: DC | PRN
  Administered 2021-02-07: 500 mg via INTRAVENOUS

## 2021-02-07 MED ORDER — FENTANYL-BUPIVACAINE-NACL 0.5-0.125-0.9 MG/250ML-% EP SOLN
12.0000 mL/h | EPIDURAL | Status: DC | PRN
  Filled 2021-02-07: qty 250

## 2021-02-07 MED ORDER — MORPHINE SULFATE (PF) 0.5 MG/ML IJ SOLN
INTRAMUSCULAR | Status: AC
  Filled 2021-02-07: qty 10

## 2021-02-07 MED ORDER — NALBUPHINE HCL 10 MG/ML IJ SOLN: 5.0000 mg | INTRAMUSCULAR | Status: DC | PRN

## 2021-02-07 MED ORDER — TERBUTALINE SULFATE 1 MG/ML IJ SOLN: 0.2500 mg | Freq: Once | INTRAMUSCULAR | Status: DC | PRN

## 2021-02-07 MED ORDER — NALOXONE HCL 0.4 MG/ML IJ SOLN: 0.4000 mg | INTRAMUSCULAR | Status: DC | PRN

## 2021-02-07 MED ORDER — ONDANSETRON HCL 4 MG/2ML IJ SOLN
4.0000 mg | Freq: Three times a day (TID) | INTRAMUSCULAR | Status: DC | PRN
  Administered 2021-02-07: 4 mg via INTRAVENOUS
  Filled 2021-02-07: qty 2

## 2021-02-07 MED ORDER — FENTANYL CITRATE (PF) 100 MCG/2ML IJ SOLN
100.0000 ug | INTRAMUSCULAR | Status: DC | PRN
  Administered 2021-02-07: 100 ug via INTRAVENOUS
  Filled 2021-02-07 (×2): qty 2

## 2021-02-07 MED ORDER — SENNOSIDES-DOCUSATE SODIUM 8.6-50 MG PO TABS
2.0000 | ORAL_TABLET | ORAL | Status: DC
  Administered 2021-02-09: 2 via ORAL
  Filled 2021-02-07: qty 2

## 2021-02-07 MED ORDER — DIPHENHYDRAMINE HCL 50 MG/ML IJ SOLN: 12.5000 mg | INTRAMUSCULAR | Status: DC | PRN

## 2021-02-07 MED ORDER — EPHEDRINE 5 MG/ML INJ: 10.0000 mg | INTRAVENOUS | Status: DC | PRN

## 2021-02-07 MED ORDER — OXYTOCIN-SODIUM CHLORIDE 30-0.9 UT/500ML-% IV SOLN
1.0000 m[IU]/min | INTRAVENOUS | Status: DC
  Administered 2021-02-07: 2 m[IU]/min via INTRAVENOUS
  Filled 2021-02-07: qty 500

## 2021-02-07 MED ORDER — IBUPROFEN 600 MG PO TABS
600.0000 mg | ORAL_TABLET | Freq: Four times a day (QID) | ORAL | Status: DC | PRN
  Administered 2021-02-08: 600 mg via ORAL
  Filled 2021-02-07 (×2): qty 1

## 2021-02-07 MED ORDER — SCOPOLAMINE 1 MG/3DAYS TD PT72: 1.0000 | MEDICATED_PATCH | Freq: Once | TRANSDERMAL | Status: DC

## 2021-02-07 MED ORDER — FENTANYL CITRATE (PF) 100 MCG/2ML IJ SOLN
INTRAMUSCULAR | Status: DC | PRN
  Administered 2021-02-07: 100 ug via EPIDURAL
  Administered 2021-02-07 (×2): 50 ug via EPIDURAL

## 2021-02-07 MED ORDER — DIBUCAINE (PERIANAL) 1 % EX OINT: 1.0000 "application " | TOPICAL_OINTMENT | CUTANEOUS | Status: DC | PRN

## 2021-02-07 MED ORDER — LACTATED RINGERS IV SOLN: INTRAVENOUS | Status: DC | PRN

## 2021-02-07 MED ORDER — ONDANSETRON HCL 4 MG/2ML IJ SOLN: 4.0000 mg | Freq: Four times a day (QID) | INTRAMUSCULAR | Status: DC | PRN

## 2021-02-07 MED ORDER — ACETAMINOPHEN 325 MG PO TABS: 650.0000 mg | ORAL_TABLET | ORAL | Status: DC | PRN

## 2021-02-07 MED ORDER — DIPHENHYDRAMINE HCL 25 MG PO CAPS: 25.0000 mg | ORAL_CAPSULE | Freq: Four times a day (QID) | ORAL | Status: DC | PRN

## 2021-02-07 MED ORDER — OXYCODONE HCL 5 MG/5ML PO SOLN: 5.0000 mg | Freq: Once | ORAL | Status: DC | PRN

## 2021-02-07 MED ORDER — SCOPOLAMINE 1 MG/3DAYS TD PT72
MEDICATED_PATCH | TRANSDERMAL | Status: AC
  Filled 2021-02-07: qty 1

## 2021-02-07 MED ORDER — COCONUT OIL OIL: 1.0000 "application " | TOPICAL_OIL | Status: DC | PRN

## 2021-02-07 MED ORDER — ONDANSETRON HCL 4 MG/2ML IJ SOLN: 4.0000 mg | Freq: Once | INTRAMUSCULAR | Status: DC | PRN

## 2021-02-07 MED ORDER — SOD CITRATE-CITRIC ACID 500-334 MG/5ML PO SOLN
30.0000 mL | ORAL | Status: DC | PRN
  Administered 2021-02-07: 30 mL via ORAL
  Filled 2021-02-07: qty 15

## 2021-02-07 MED ORDER — ONDANSETRON HCL 4 MG/2ML IJ SOLN
INTRAMUSCULAR | Status: AC
  Filled 2021-02-07: qty 2

## 2021-02-07 MED ORDER — CEFAZOLIN SODIUM-DEXTROSE 2-3 GM-%(50ML) IV SOLR
INTRAVENOUS | Status: DC | PRN
  Administered 2021-02-07: 2 g via INTRAVENOUS

## 2021-02-07 MED ORDER — STERILE WATER FOR IRRIGATION IR SOLN
Status: DC | PRN
  Administered 2021-02-07: 1000 mL

## 2021-02-07 MED ORDER — AMLODIPINE BESYLATE 5 MG PO TABS
10.0000 mg | ORAL_TABLET | Freq: Every day | ORAL | Status: DC
  Administered 2021-02-08 (×2): 10 mg via ORAL
  Filled 2021-02-07 (×2): qty 2

## 2021-02-07 MED ORDER — CEFAZOLIN SODIUM-DEXTROSE 2-4 GM/100ML-% IV SOLN
INTRAVENOUS | Status: AC
  Filled 2021-02-07: qty 100

## 2021-02-07 MED ORDER — SIMETHICONE 80 MG PO CHEW
80.0000 mg | CHEWABLE_TABLET | ORAL | Status: DC | PRN
Start: 2021-02-07 — End: 2021-02-09

## 2021-02-07 MED ORDER — METOCLOPRAMIDE HCL 5 MG/ML IJ SOLN
INTRAMUSCULAR | Status: DC | PRN
  Administered 2021-02-07: 10 mg via INTRAVENOUS

## 2021-02-07 MED ORDER — FENTANYL-BUPIVACAINE-NACL 0.5-0.125-0.9 MG/250ML-% EP SOLN
EPIDURAL | Status: DC | PRN
  Administered 2021-02-07: 12 mL/h via EPIDURAL

## 2021-02-07 MED ORDER — NALBUPHINE HCL 10 MG/ML IJ SOLN
5.0000 mg | Freq: Once | INTRAMUSCULAR | Status: AC | PRN
Start: 2021-02-07 — End: 2021-02-07

## 2021-02-07 MED ORDER — MORPHINE SULFATE (PF) 0.5 MG/ML IJ SOLN
INTRAMUSCULAR | Status: DC | PRN
  Administered 2021-02-07: 3 mg via EPIDURAL

## 2021-02-07 MED ORDER — DEXAMETHASONE SODIUM PHOSPHATE 4 MG/ML IJ SOLN
INTRAMUSCULAR | Status: DC | PRN
  Administered 2021-02-07: 10 mg via INTRAVENOUS

## 2021-02-07 MED ORDER — OXYCODONE HCL 5 MG PO TABS
5.0000 mg | ORAL_TABLET | ORAL | Status: DC | PRN
  Administered 2021-02-08 (×2): 10 mg via ORAL
  Administered 2021-02-08: 5 mg via ORAL
  Filled 2021-02-07 (×3): qty 2

## 2021-02-07 MED ORDER — OXYTOCIN-SODIUM CHLORIDE 30-0.9 UT/500ML-% IV SOLN
INTRAVENOUS | Status: AC
  Filled 2021-02-07: qty 500

## 2021-02-07 MED ORDER — NICOTINE 21 MG/24HR TD PT24
21.0000 mg | MEDICATED_PATCH | Freq: Every day | TRANSDERMAL | Status: DC
  Administered 2021-02-07: 21 mg via TRANSDERMAL
  Filled 2021-02-07: qty 1

## 2021-02-07 MED ORDER — NALBUPHINE HCL 10 MG/ML IJ SOLN
INTRAMUSCULAR | Status: AC
  Filled 2021-02-07: qty 1

## 2021-02-07 MED ORDER — LACTATED RINGERS IV SOLN: INTRAVENOUS | Status: DC

## 2021-02-07 MED ORDER — OXYTOCIN-SODIUM CHLORIDE 30-0.9 UT/500ML-% IV SOLN: 2.5000 [IU]/h | INTRAVENOUS | Status: DC

## 2021-02-07 MED ORDER — SCOPOLAMINE 1 MG/3DAYS TD PT72
MEDICATED_PATCH | TRANSDERMAL | Status: DC | PRN
  Administered 2021-02-07: 1 via TRANSDERMAL

## 2021-02-07 MED ORDER — OXYCODONE-ACETAMINOPHEN 5-325 MG PO TABS: 2.0000 | ORAL_TABLET | ORAL | Status: DC | PRN

## 2021-02-07 MED ORDER — OXYTOCIN-SODIUM CHLORIDE 30-0.9 UT/500ML-% IV SOLN
INTRAVENOUS | Status: DC | PRN
  Administered 2021-02-07: 450 mL via INTRAVENOUS

## 2021-02-07 MED ORDER — OXYCODONE-ACETAMINOPHEN 5-325 MG PO TABS: 1.0000 | ORAL_TABLET | ORAL | Status: DC | PRN

## 2021-02-07 MED ORDER — ACETAMINOPHEN 500 MG PO TABS
1000.0000 mg | ORAL_TABLET | Freq: Four times a day (QID) | ORAL | Status: DC
  Administered 2021-02-08 (×2): 1000 mg via ORAL
  Filled 2021-02-07 (×3): qty 2

## 2021-02-07 MED ORDER — LACTATED RINGERS IV SOLN: 500.0000 mL | INTRAVENOUS | Status: DC | PRN

## 2021-02-07 MED ORDER — PHENYLEPHRINE HCL (PRESSORS) 10 MG/ML IV SOLN
INTRAVENOUS | Status: DC | PRN
  Administered 2021-02-07: 80 ug via INTRAVENOUS

## 2021-02-07 MED ORDER — CHLOROPROCAINE HCL (PF) 3 % IJ SOLN
INTRAMUSCULAR | Status: DC | PRN
  Administered 2021-02-07: 10 mg
  Administered 2021-02-07: 7 mg
  Administered 2021-02-07: 3 mg

## 2021-02-07 MED ORDER — OXYTOCIN-SODIUM CHLORIDE 30-0.9 UT/500ML-% IV SOLN: 1.0000 m[IU]/min | INTRAVENOUS | Status: DC

## 2021-02-07 MED ORDER — NALOXONE HCL 4 MG/10ML IJ SOLN
1.0000 ug/kg/h | INTRAVENOUS | Status: DC | PRN
  Filled 2021-02-07: qty 5

## 2021-02-07 MED ORDER — OXYTOCIN-SODIUM CHLORIDE 30-0.9 UT/500ML-% IV SOLN
2.5000 [IU]/h | INTRAVENOUS | Status: AC
  Administered 2021-02-07: 2.5 [IU]/h via INTRAVENOUS
  Filled 2021-02-07: qty 500

## 2021-02-07 MED ORDER — CHLOROPROCAINE HCL (PF) 3 % IJ SOLN
INTRAMUSCULAR | Status: AC
  Filled 2021-02-07: qty 20

## 2021-02-07 MED ORDER — FENTANYL CITRATE (PF) 100 MCG/2ML IJ SOLN
INTRAMUSCULAR | Status: AC
  Filled 2021-02-07: qty 2

## 2021-02-07 MED ORDER — PRENATAL MULTIVITAMIN CH
1.0000 | ORAL_TABLET | Freq: Every day | ORAL | Status: DC
  Administered 2021-02-08: 1 via ORAL
  Filled 2021-02-07: qty 1

## 2021-02-07 MED ORDER — DEXAMETHASONE SODIUM PHOSPHATE 4 MG/ML IJ SOLN
INTRAMUSCULAR | Status: AC
  Filled 2021-02-07: qty 1

## 2021-02-07 MED ORDER — ACETAMINOPHEN 10 MG/ML IV SOLN
INTRAVENOUS | Status: AC
  Filled 2021-02-07: qty 100

## 2021-02-07 MED ORDER — MENTHOL 3 MG MT LOZG: 1.0000 | LOZENGE | OROMUCOSAL | Status: DC | PRN

## 2021-02-07 MED ORDER — DEXMEDETOMIDINE (PRECEDEX) IN NS 20 MCG/5ML (4 MCG/ML) IV SYRINGE
PREFILLED_SYRINGE | INTRAVENOUS | Status: DC | PRN
  Administered 2021-02-07: 8 ug via INTRAVENOUS
  Administered 2021-02-07: 4 ug via INTRAVENOUS

## 2021-02-07 MED ORDER — HYDROMORPHONE HCL 1 MG/ML IJ SOLN: 0.2500 mg | INTRAMUSCULAR | Status: DC | PRN

## 2021-02-07 MED ORDER — ONDANSETRON HCL 4 MG/2ML IJ SOLN
INTRAMUSCULAR | Status: DC | PRN
  Administered 2021-02-07: 4 mg via INTRAVENOUS

## 2021-02-07 MED ORDER — METOCLOPRAMIDE HCL 5 MG/ML IJ SOLN
INTRAMUSCULAR | Status: AC
  Filled 2021-02-07: qty 2

## 2021-02-07 MED ORDER — ZOLPIDEM TARTRATE 5 MG PO TABS: 5.0000 mg | ORAL_TABLET | Freq: Every evening | ORAL | Status: DC | PRN

## 2021-02-07 MED ORDER — DIPHENHYDRAMINE HCL 25 MG PO CAPS: 25.0000 mg | ORAL_CAPSULE | ORAL | Status: DC | PRN

## 2021-02-07 MED ORDER — SODIUM CHLORIDE 0.9% FLUSH: 3.0000 mL | INTRAVENOUS | Status: DC | PRN

## 2021-02-07 MED ORDER — CHLOROPROCAINE HCL (PF) 3 % IJ SOLN
INTRAMUSCULAR | Status: DC | PRN
  Administered 2021-02-07: 10 mL via EPIDURAL
  Administered 2021-02-07: 5 mL via EPIDURAL

## 2021-02-07 MED ORDER — SODIUM CHLORIDE 0.9 % IV SOLN
INTRAVENOUS | Status: AC
  Filled 2021-02-07: qty 500

## 2021-02-07 MED ORDER — NICOTINE POLACRILEX 2 MG MT GUM
2.0000 mg | CHEWING_GUM | OROMUCOSAL | Status: DC | PRN
  Filled 2021-02-07: qty 1

## 2021-02-07 MED ORDER — ENOXAPARIN SODIUM 40 MG/0.4ML ~~LOC~~ SOLN
40.0000 mg | SUBCUTANEOUS | Status: DC
  Filled 2021-02-07: qty 0.4

## 2021-02-07 MED ORDER — TETANUS-DIPHTH-ACELL PERTUSSIS 5-2.5-18.5 LF-MCG/0.5 IM SUSY: 0.5000 mL | PREFILLED_SYRINGE | Freq: Once | INTRAMUSCULAR | Status: DC

## 2021-02-07 MED ORDER — PHENYLEPHRINE 40 MCG/ML (10ML) SYRINGE FOR IV PUSH (FOR BLOOD PRESSURE SUPPORT): 80.0000 ug | PREFILLED_SYRINGE | INTRAVENOUS | Status: DC | PRN

## 2021-02-07 MED ORDER — KETOROLAC TROMETHAMINE 30 MG/ML IJ SOLN: 30.0000 mg | Freq: Once | INTRAMUSCULAR | Status: DC | PRN

## 2021-02-07 MED ORDER — WITCH HAZEL-GLYCERIN EX PADS: 1.0000 "application " | MEDICATED_PAD | CUTANEOUS | Status: DC | PRN

## 2021-02-07 MED ORDER — NALBUPHINE HCL 10 MG/ML IJ SOLN
5.0000 mg | Freq: Once | INTRAMUSCULAR | Status: AC | PRN
  Administered 2021-02-07: 5 mg via INTRAVENOUS

## 2021-02-07 MED ORDER — ACETAMINOPHEN 10 MG/ML IV SOLN
INTRAVENOUS | Status: DC | PRN
  Administered 2021-02-07: 1000 mg via INTRAVENOUS

## 2021-02-07 MED ORDER — OXYCODONE HCL 5 MG PO TABS: 5.0000 mg | ORAL_TABLET | Freq: Once | ORAL | Status: DC | PRN

## 2021-02-07 MED ORDER — SIMETHICONE 80 MG PO CHEW
80.0000 mg | CHEWABLE_TABLET | Freq: Three times a day (TID) | ORAL | Status: DC
  Administered 2021-02-08 – 2021-02-09 (×2): 80 mg via ORAL
  Filled 2021-02-07 (×2): qty 1

## 2021-02-07 MED ORDER — LIDOCAINE HCL (PF) 1 % IJ SOLN: 30.0000 mL | INTRAMUSCULAR | Status: DC | PRN

## 2021-02-07 SURGICAL SUPPLY — 36 items
BENZOIN TINCTURE PRP APPL 2/3 (GAUZE/BANDAGES/DRESSINGS) ×2 IMPLANT
CHLORAPREP W/TINT 26ML (MISCELLANEOUS) ×2 IMPLANT
CLAMP CORD UMBIL (MISCELLANEOUS) IMPLANT
CLOTH BEACON ORANGE TIMEOUT ST (SAFETY) ×2 IMPLANT
DRSG OPSITE POSTOP 4X10 (GAUZE/BANDAGES/DRESSINGS) ×2 IMPLANT
ELECT REM PT RETURN 9FT ADLT (ELECTROSURGICAL) ×2
ELECTRODE REM PT RTRN 9FT ADLT (ELECTROSURGICAL) ×1 IMPLANT
EXTRACTOR VACUUM M CUP 4 TUBE (SUCTIONS) IMPLANT
GLOVE BIOGEL PI IND STRL 7.0 (GLOVE) ×2 IMPLANT
GLOVE BIOGEL PI IND STRL 7.5 (GLOVE) ×2 IMPLANT
GLOVE BIOGEL PI INDICATOR 7.0 (GLOVE) ×2
GLOVE BIOGEL PI INDICATOR 7.5 (GLOVE) ×2
GLOVE ECLIPSE 7.5 STRL STRAW (GLOVE) ×2 IMPLANT
GOWN STRL REUS W/TWL LRG LVL3 (GOWN DISPOSABLE) ×6 IMPLANT
HEMOSTAT SURGICEL 2X3 (HEMOSTASIS) ×2 IMPLANT
HEMOSTAT SURGICEL 4X8 (HEMOSTASIS) ×2 IMPLANT
KIT ABG SYR 3ML LUER SLIP (SYRINGE) IMPLANT
NEEDLE HYPO 25X5/8 SAFETYGLIDE (NEEDLE) ×2 IMPLANT
NS IRRIG 1000ML POUR BTL (IV SOLUTION) ×2 IMPLANT
PACK C SECTION WH (CUSTOM PROCEDURE TRAY) ×2 IMPLANT
PAD ABD 7.5X8 STRL (GAUZE/BANDAGES/DRESSINGS) ×2 IMPLANT
PAD OB MATERNITY 4.3X12.25 (PERSONAL CARE ITEMS) ×2 IMPLANT
PENCIL SMOKE EVAC W/HOLSTER (ELECTROSURGICAL) ×2 IMPLANT
RTRCTR C-SECT PINK 25CM LRG (MISCELLANEOUS) ×2 IMPLANT
SPONGE GAUZE 4X4 12PLY STER LF (GAUZE/BANDAGES/DRESSINGS) ×4 IMPLANT
SPONGE LAP 18X18 RF (DISPOSABLE) ×2 IMPLANT
STRIP CLOSURE SKIN 1/2X4 (GAUZE/BANDAGES/DRESSINGS) ×2 IMPLANT
SUT VIC AB 0 CT1 36 (SUTURE) ×2 IMPLANT
SUT VIC AB 0 CTX 36 (SUTURE) ×6
SUT VIC AB 0 CTX36XBRD ANBCTRL (SUTURE) ×3 IMPLANT
SUT VIC AB 2-0 CT1 27 (SUTURE) ×2
SUT VIC AB 2-0 CT1 TAPERPNT 27 (SUTURE) ×1 IMPLANT
SUT VIC AB 4-0 KS 27 (SUTURE) ×2 IMPLANT
TOWEL OR 17X24 6PK STRL BLUE (TOWEL DISPOSABLE) ×2 IMPLANT
TRAY FOLEY W/BAG SLVR 14FR LF (SET/KITS/TRAYS/PACK) ×2 IMPLANT
WATER STERILE IRR 1000ML POUR (IV SOLUTION) ×2 IMPLANT

## 2021-02-07 NOTE — Anesthesia Procedure Notes (Addendum)
Epidural Patient location during procedure: OB Start time: 02/07/2021 12:16 PM End time: 02/07/2021 12:30 PM  Staffing Anesthesiologist: Trevor Iha, MD Performed: anesthesiologist   Preanesthetic Checklist Completed: patient identified, IV checked, site marked, risks and benefits discussed, surgical consent, monitors and equipment checked, pre-op evaluation and timeout performed  Epidural Patient position: sitting Prep: DuraPrep and site prepped and draped Patient monitoring: continuous pulse ox and blood pressure Approach: midline Location: L3-L4 Injection technique: LOR air  Needle:  Needle type: Tuohy  Needle gauge: 17 G Needle length: 9 cm and 9 Needle insertion depth: 6 cm Catheter type: closed end flexible Catheter size: 19 Gauge Catheter at skin depth: 12 cm Test dose: negative  Assessment Events: blood not aspirated, injection not painful, no injection resistance, no paresthesia and negative IV test  Additional Notes Patient identified. Risks/Benefits/Options discussed with patient including but not limited to bleeding, infection, nerve damage, paralysis, failed block, incomplete pain control, headache, blood pressure changes, nausea, vomiting, reactions to medication both or allergic, itching and postpartum back pain. Confirmed with bedside nurse the patient's most recent platelet count. Confirmed with patient that they are not currently taking any anticoagulation, have any bleeding history or any family history of bleeding disorders. Patient expressed understanding and wished to proceed. All questions were answered. Sterile technique was used throughout the entire procedure. Please see nursing notes for vital signs. Test dose was given through epidural needle and negative prior to continuing to dose epidural or start infusion. Warning signs of high block given to the patient including shortness of breath, tingling/numbness in hands, complete motor block, or any  concerning symptoms with instructions to call for help. Patient was given instructions on fall risk and not to get out of bed. All questions and concerns addressed with instructions to call with any issues. 1  Attempt (S) . Patient tolerated procedure well.

## 2021-02-07 NOTE — Progress Notes (Addendum)
Labor Progress Note Vanessa Wise is a 35 y.o. [phone] at [redacted]w[redacted]d presented for IOL secondary to gHTN, found to have Preeclampsia without severe features on admission. S: Expresses discomfort, mor comfortable laying on side.  O:  BP 122/80   Pulse 66   Temp (!) 97.3 F (36.3 C) (Oral)   Resp 16   Ht 5\' 4"  (1.626 m)   Wt 80.4 kg   SpO2 98%   BMI 30.43 kg/m  EFM: baseline 130/moderate variability/+accels/no decels Toco: ctx every 2-3 minutes  CVE: Dilation: 4 Effacement (%): 70 Station: -2 Presentation: Vertex Exam by:: K Jeanluc Wegman CNM   A&P: 35 y.o. 32 [redacted]w[redacted]d presented for IOL secondary to gHTN, found to have Preeclampsia without severe features on admission. #TOLAC: consent for TOLAC signed on admission. FB placement attempted but unsuccessful on admission, so pitocin was initiated at 0145. However, several minutes s/p pitocin was started, FHT showed prolonged deceleration and pitocin was discontinued. Pitocin ultimately restarted 1x1 at 0300. FB now in place.  Continuing Pitocin 1x1.  Not increasing currently due to discomfort.  Consider starting epidural at next check. #Pain: TBD per pt request #FWB: Category 1 strip #GBS negative #Preeclampsia w/o SF: Admission labs notable for UP:C of 1.13 (up from baseline of 0.11). Preeclampsia labs otherwise unremarkable except for Hgb 10.2. Pt continued to be asymptomatic. Will continue to monitor. #Tobacco Use: 1 ppd. Nicotine patch+gum prn ordered on admission #Late PNC  Marijuana Use: plan for SW consult in postpartum period #Labor: Progressing well.    [redacted]w[redacted]d, MD 10:59 AM   Attestation of Supervision of Student:  I confirm that I have verified the information documented in the  resident  student's note and that I have also personally reperformed the history, physical exam and all medical decision making activities.  I have verified that all services and findings are accurately documented in this student's note; and I  agree with management and plan as outlined in the documentation. I have also made any necessary editorial changes.   Jovita Kussmaul, CNM [facility], [facility] 02/07/2021 5:50 PM

## 2021-02-07 NOTE — H&P (Addendum)
OBSTETRIC ADMISSION HISTORY AND PHYSICAL  Vanessa Wise is a 35 y.o. female [phone] with IUP at [redacted]w[redacted]d by 5 week ultrasound presenting for IOL secondary to gHTN. She reports +FMs, No LOF, no VB, no blurry vision, headaches or peripheral edema, and RUQ pain.  She plans on breast and bottle feeding. She requests inpatient Nexplanon for birth control. She received her prenatal care at [facility]   Dating: By 5 week ultrasound --->  Estimated Date of Delivery: 02/27/21  Sono:  @[redacted]w[redacted]d , CWD, normal anatomy, cephalic presentation, 2197g, 10-22-1968 EFW  Prenatal History/Complications:  - late prenatal care (established at [redacted]w[redacted]d) - h/o Cesarean x1 ([redacted]w[redacted]d in s/o severe preeclampsia) - tobacco use in pregnancy - fetal anomaly (absesnt left kidney) - gHTN with h/o Preeclampsia - single umbilical artery - false positive RPR     Past Medical History: Past Medical History:  Diagnosis Date  . [redacted] weeks gestation of pregnancy   . Anxiety   . Chlamydia infection 02/03/2012  . IBS (irritable bowel syndrome)   . Migraines   . Pain due to intrauterine contraceptive device (IUD) (HCC) 01/17/2015   S/p hysteroscopic removal 01/17/2015   . Seasonal allergies     Past Surgical History: Past Surgical History:  Procedure Laterality Date  . CESAREAN SECTION N/A 07/07/2016   Procedure: CESAREAN SECTION;  Surgeon: 09/06/2016, MD;  Location: [facility] BIRTHING SUITES;  Service: Obstetrics;  Laterality: N/A;  . HYSTEROSCOPY N/A 01/17/2015   Procedure: HYSTEROSCOPY WITH REMOVAL INTRAUTERINE DIVICE;  Surgeon: 01/19/2015, MD;  Location: [facility] ORS;  Service: Gynecology;  Laterality: N/A;  . IUD REMOVAL    . MOUTH SURGERY    . NO PAST SURGERIES      Obstetrical History: OB History    Gravida  4   Para  3   Term  1   Preterm  2   AB      Living  3     SAB      IAB      Ectopic      Multiple  0   Live Births  1           Social History Social History   Socioeconomic History  . Marital  status: Single    Spouse name: Not on file  . Number of children: Not on file  . Years of education: Not on file  . Highest education level: Not on file  Occupational History  . Not on file  Tobacco Use  . Smoking status: Current Every Day Smoker    Packs/day: 1.00    Years: 9.00    Pack years: 9.00    Types: Cigarettes  . Smokeless tobacco: Never Used  . Tobacco comment: 1 pack or less/ day  Vaping Use  . Vaping Use: Never used  Substance and Sexual Activity  . Alcohol use: No    Comment: rare  . Drug use: Not Currently    Types: Marijuana    Comment: Last used 06/25/16  . Sexual activity: Not Currently    Birth control/protection: None  Other Topics Concern  . Not on file  Social History Narrative  . Not on file   Social Determinants of Health   Financial Resource Strain: Not on file  Food Insecurity: Not on file  Transportation Needs: Not on file  Physical Activity: Not on file  Stress: Not on file  Social Connections: Not on file    Family History: Family History  Problem Relation Age of Onset  .  Hypertension Mother     Allergies: No Known Allergies  Medications Prior to Admission  Medication Sig Dispense Refill Last Dose  . acetaminophen (TYLENOL) 500 MG tablet Take 1,000 mg by mouth every 6 (six) hours as needed for mild pain or headache.     Marland Kitchen aspirin EC 81 MG tablet Take 1 tablet (81 mg total) by mouth daily. Take after 12 weeks for prevention of preeclampsia later in pregnancy 300 tablet 2   . calcium carbonate (TUMS - DOSED IN MG ELEMENTAL CALCIUM) 500 MG chewable tablet Chew 4 tablets by mouth daily as needed for indigestion or heartburn.     . ferrous sulfate 325 (65 FE) MG tablet Take 1 tablet (325 mg total) by mouth 2 (two) times daily with a meal. 60 tablet 3   . pantoprazole (PROTONIX) 20 MG tablet Take 1 tablet (20 mg total) by mouth daily. (Patient not taking: Reported on 01/22/2021) 30 tablet 1   . Prenatal Vit-Fe Phos-FA-Omega (VITAFOL  GUMMIES) 3.33-0.333-34.8 MG CHEW Chew 1 tablet by mouth daily. 90 tablet 5   . promethazine (PHENERGAN) 12.5 MG tablet Take 1 tablet (12.5 mg total) by mouth every 6 (six) hours as needed for nausea or vomiting. (Patient not taking: No sig reported) 30 tablet 0      Review of Systems   All systems reviewed and negative except as stated in HPI  currently breastfeeding. General appearance: alert, cooperative and appears stated age Lungs: normal WOB Heart: regular rate  Abdomen: soft, non-tender Extremities: no sign of DVT Presentation: cephalic for bedside ultrasound Fetal monitoringBaseline: 140 bpm, Variability: Good {> 6 bpm), Accelerations: Reactive and Decelerations: Absent Uterine activity: irregular    Prenatal labs: ABO, Rh: --/--/O POS (03/18 1215) Antibody: NEG (03/18 1215) Rubella: 1.01 (03/07 1602) RPR: Reactive (03/07 1602)  HBsAg: Negative (03/07 1602)  HIV: Non Reactive (03/07 1602)  GBS:   negative 1 hr Glucola not performed (late to prenatal care) Genetic screening not performed (late to prenatal care) Anatomy US: wnl except for absent left fetal kidney  Prenatal Transfer Tool  Maternal Diabetes: No Genetic Screening: not performed--late to prenatal care Maternal Ultrasounds/Referrals: Normal except for absent left fetal kideny Fetal Ultrasounds or other Referrals:  Referred to Materal Fetal Medicine  Maternal Substance Abuse:  Yes:  Type: Smoker, Marijuana Significant Maternal Medications:  None Significant Maternal Lab Results: Group B Strep negative, false positive RPR  No results found for this or any previous visit (from the past 24 hour(s)).  Patient Active Problem List   Diagnosis Date Noted  . Supervision of high risk pregnancy, antepartum 02/07/2021  . [redacted] weeks gestation of pregnancy 01/21/2021  . 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 Vaginal bleeding in pregnancy, third trimester 01/19/2021  . Fetal abnormality  in antepartum pregnancy 12/19/2020  . Late prenatal care, antepartum 12/19/2020  . 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 Hx of preeclampsia, prior pregnancy, currently pregnant 06/26/2016  . Cannabis dependence 01/28/2012  . Chronic migraine 01/28/2012  . TOBACCO USER 11/06/2009    Assessment/Plan:  Vanessa Wise is a 35 y.o. [phone] at [redacted]w[redacted]d here for IOL secondary to gHTN.  #TOLAC: Given h/o Cesarean will plan for FB placement with initiation of low dose pitocin.  Reviewed risks/benefits of TOLAC versus RCS in detail. Patient counseled regarding potential vaginal delivery, chance of success, future implications, possible uterine rupture and need for urgent/emergent repeat cesarean. Counseled regarding potential need for repeat c-section for reasons unrelated to first c-section. Counseled  regarding scheduled repeat cesarean including risks of bleeding, infection, damage to surrounding tissue, abnormal placentation, implications for future pregnancies. All questions answered.  Patient desires TOLAC, consent signed 02/07/2021.  #Pain: TBD per pt request #FWB:  Category 1 strip #ID: GBS negative #MOF: breast & bottle #MOC: inpatient Nexplanon; consented on admission #Circ: n/a #gHTN  H/o Preeclampsia: Pt without signs/symptoms of preeclampsia on admission. F/u PreE labs on admission. #Late PNC Marijuana Use in Pregnancy: plan for SW consult in postpartum period #Fetal anomaly: peds aware of absent L kidney #Tobacco use: encouraged complete cessation on admission  Tenesha Garza, Skipper Cliche, MD OB Fellow, Faculty Practice 02/07/2021 12:21 AM

## 2021-02-07 NOTE — Op Note (Addendum)
Operative Note   SURGERY DATE: 02/07/2021  PRE-OP DIAGNOSIS:  *Pregnancy at [redacted]w[redacted]d *Cord Prolapse *History of prior c section  POST-OP DIAGNOSIS:  Same    PROCEDURE: stat repeat low transverse cesarean section via pfannenstiel skin incision with double layer uterine closure  SURGEON: Surgeon(s) and Role:    * Ebert Forrester, Ailene Rud, MD - Primary    * Janyth Pupa, DO - Assisting    * Marsala, Placido Sou, MD - Fellow   ANESTHESIA: epidural  ESTIMATED BLOOD LOSS:  594  DRAINS: 145mL UOP via indwelling foley  TOTAL IV FLUIDS: 3319mL crystalloid  VTE PROPHYLAXIS: SCDs to bilateral lower extremities  ANTIBIOTICS: Two grams of Cefazolin were given, $RemoveBef'500mg'[id number]$  azithromycin, within 1 hour of skin incision  SPECIMENS: placenta  COMPLICATIONS: none. Given hysterotomy very close to bladder, discussed with patient would not advise any future attempt at [facility].  INDICATIONS: Cord Prolapse   FINDINGS: No intra-abdominal adhesions were noted. Grossly normal uterus, tubes and ovaries. Clear amniotic fluid, cephalic  female infant, weight pending, APGARs 7/9, intact placenta. Cord gas results   Ref. Range 02/07/2021 14:20  pH cord blood (arterial) Latest Ref Range: 7.210 - 7.380  7.072 (LL)  pCO2 cord blood (arterial) Latest Ref Range: 42.0 - 56.0 mmHg 86.5 (H)  Bicarbonate Latest Ref Range: 13.0 - 22.0 mmol/L 24.1 (H)    PROCEDURE IN DETAIL:   Cord prolapse during initial attempts at pushing. Fetal station and maternal effort deemed by delivering midwife not to be adequate for safe prompt vaginal delivery and so emergency cesarean section was called. I met the patient in the OR.   Due to emergency situation, a full written consent process was not obtained. The risks, benefits, complications, treatment options, and expected outcomes were discussed with the patient while moving to the OR. Basic consent was obtained given the emergent nature of the surgery. The patient agreed with proceeding with  surgery.  PROCEDURE IN DETAIL:  The patient was urgently taken to the the operating room her epidural anesthesia was dosed up to surgical level and was found to be adequate. Ancef and azithromycin ordered. She was then placed in a dorsal supine position with a leftward tilt, prepped quickly with betadine and draped in a sterile manner.  She already had a foley catheter in her bladder from L&D.  After a timeout was performed and anesthesia tested and deemed to be adequate, a Pfannenstiel skin incision was made with scalpel and carried through to the underlying layer of fascia. The fascial incision was extended bilaterally in a blunt fashion.  The fascia was separated from underlying rectus muscles bluntly.  The rectus muscles were separated in the midline bluntly and the peritoneum was entered bluntly. Attention was turned to the lower uterine segment where a low transverse hysterotomy was made with a scalpel and extended bilaterally bluntly. Attempted to deliver infant from cephalic was unsuccessful, able to deliver infant from breech presentation. The infant was successfully delivered. Poor tone noted, decision made to clamp and cut before the usual 60 seconds, and the infant was handed over to awaiting neonatology team. Incision to delivery time was less than two minutes. Time from decision to delivery was less than 10 minutes.  A cord gas was obtained with the results above. The placenta was delivered intact and had a three-vessel cord. The uterus was then exteriorized and cleared of clot and debris.  The hysterotomy was closed with 0 Vicryl in a running locked fashion,. Bladder was noted to be close to the  incision line and serosa overlying this area noted to be denuded.. At this time experienced surgeon Dr. Nelda Marseille was called in to assist. We back-filled the bladder with 300 ccs sterile milk and noted no leakage. We then carefully completed an imbricating layer with 0 vicryl. Hemostasis achieved after  placement of figure of 8 suture. The pelvis was cleared of all clot and debris. Irrigation with warm saline was performed and surgicel was placed on hysterotomy site. The fascia was then closed using 0 Vicryl in a running fashion. The subcutaneous layer was irrigated and the skin was closed with a 4-0 Vicryl subcuticular stitch.    The patient  tolerated the procedure well. Sponge, lap, needle, and instrument counts were correct x 2. The patient was transferred to the recovery room awake, alert and breathing independently in stable condition.    Sharene Skeans, MD [facility] Fellow, [facility] for [facility] [place] of Attending Supervision of Provider:  Evaluation and management procedures were performed by this provider under my supervision and collaboration. I have reviewed the provider's note and chart, and I agree with the management and plan.   Laurey Arrow, MD Faculty Practice, [facility]

## 2021-02-07 NOTE — Anesthesia Preprocedure Evaluation (Addendum)
Anesthesia Evaluation  Patient identified by MRN, date of birth, ID band Patient awake    Reviewed: Allergy & Precautions, NPO status , Patient's Chart, lab work & pertinent test results  Airway Mallampati: II  TM Distance: >3 FB Neck ROM: Full    Dental no notable dental hx. (+) Teeth Intact, Dental Advisory Given   Pulmonary Current Smoker,    Pulmonary exam normal breath sounds clear to auscultation       Cardiovascular hypertension (g HTN), Normal cardiovascular exam Rhythm:Regular Rate:Normal     Neuro/Psych    GI/Hepatic negative GI ROS, Lab Results      Component                Value               Date                      ALT                      14                  02/07/2021                AST                      21                  02/07/2021                ALKPHOS                  183 (H)             02/07/2021                BILITOT                  0.4                 02/07/2021              Endo/Other    Renal/GU      Musculoskeletal negative musculoskeletal ROS (+)   Abdominal   Peds  Hematology Lab Results      Component                Value               Date                      WBC                      7.7                 02/07/2021                HGB                      10.6 (L)            02/07/2021                HCT                      32.0 (L)            02/07/2021  MCV                      89.6                02/07/2021                PLT                      149 (L)             02/07/2021              Anesthesia Other Findings   Reproductive/Obstetrics (+) Pregnancy                             Anesthesia Physical Anesthesia Plan  ASA: III and emergent  Anesthesia Plan: Epidural   Post-op Pain Management:    Induction:   PONV Risk Score and Plan: Treatment may vary due to age or medical condition, Ondansetron and  Dexamethasone  Airway Management Planned: Natural Airway  Additional Equipment:   Intra-op Plan:   Post-operative Plan:   Informed Consent: I have reviewed the patients History and Physical, chart, labs and discussed the procedure including the risks, benefits and alternatives for the proposed anesthesia with the patient or authorized representative who has indicated his/her understanding and acceptance.     Dental advisory given  Plan Discussed with: CRNA and Anesthesiologist  Anesthesia Plan Comments: ( 37.1 wk G4P3 w Pre E  Smoker for TOLAc under LEA to Stat C/S for cord prolapse)      Anesthesia Quick Evaluation

## 2021-02-07 NOTE — Progress Notes (Signed)
Labor Progress Note Vanessa Wise is a 35 y.o. [phone] at [redacted]w[redacted]d presented for IOL secondary to gHTN, found to have Preeclampsia without severe features on admission.  S: Pt reports moderate discomfort with contractions. No headache, vision change or other concerns.  O:  BP (!) 150/93   Pulse 81   Temp 98 F (36.7 C) (Oral)   Resp 16   Ht 5\' 4"  (1.626 m)   Wt 80.4 kg   SpO2 98%   BMI 30.43 kg/m  EFM: baseline 130/moderate variability/+accels/no decels Toco: ctx every 5 minutes  CVE: Dilation: 1 Effacement (%): 50 Station: -3 Presentation: Vertex Exam by:: Skiler Olden, MD   A&P: 35 y.o. 32 [redacted]w[redacted]d presented for IOL secondary to gHTN, found to have Preeclampsia without severe features on admission. #TOLAC: consent for TOLAC signed on admission. FB placement attempted but unsuccessful on admission, so pitocin was initiated at 0145. However, several minutes s/p pitocin was started, FHT showed prolonged deceleration and pitocin was discontinued. Pitocin ultimately restarted 1x1 at 0300. FB now in place. #Pain: TBD per pt request #FWB: Category 1 strip #GBS negative #Preeclampsia w/o SF: Admission labs notable for UP:C of 1.13 (up from baseline of 0.11). Preeclampsia labs otherwise unremarkable except for Hgb 10.2. Pt continued to be asymptomatic. Will continue to monitor. #Tobacco Use: 1 ppd. Nicotine patch+gum prn ordered on admission #Late PNC  Marijuana Use: plan for SW consult in postpartum period  [redacted]w[redacted]d, MD 6:20 AM

## 2021-02-07 NOTE — Discharge Summary (Signed)
Postpartum Discharge Summary    Patient Name: Vanessa Wise DOB: [birth year] MRN: [id number]  Date of admission: 02/07/2021 Delivery date:02/07/2021  Delivering provider: Laurey Arrow [place]  Date of discharge: 02/09/2021  Admitting diagnosis: Supervision of high risk pregnancy, antepartum [O09.90] Intrauterine pregnancy: [redacted]w[redacted]d     Secondary diagnosis:  Active Problems:   Supervision of high risk pregnancy, antepartum   History of 2 cesarean sections  Additional problems: IOL for mild preeclampsia    Discharge diagnosis: Term Pregnancy Delivered and Preeclampsia (mild)                                              Post partum procedures:None Augmentation: AROM and Pitocin Complications: Cord Prolapse   Hospital course: Induction of Labor With Cesarean Section   35 y.o. yo [phone] at [redacted]w[redacted]d was admitted to the hospital 02/07/2021 for induction of labor. Patient had a labor course significant for progressing to complete after AROM and pitocin. Upon started to push a large segment of cord was noted to prolapse. The patient went for cesarean section due to Cord Prolapse. Delivery details are as follows: Membrane Rupture Time/Date: 1:30 PM ,02/07/2021   Delivery Method:C-Section, Low Transverse  Details of operation can be found in separate operative Note.  Patient had an normal postpartum course. Her blood pressures were mildly elevated, she was discharged on vasotec. She is ambulating, tolerating a regular diet, passing flatus, and urinating well.  Patient is discharged home in stable condition on 02/09/21.      Newborn Data: Birth date:02/07/2021  Birth time:2:21 PM  Gender:Female  Living status:Living  Apgars:7 ,9  Weight:5 lb 4.7 oz (2.4 kg)                                 Magnesium Sulfate received: No BMZ received: No Rhophylac:N/A MMR:N/A T-DaP:Given prenatally Offered postpartum  Flu: N/A Transfusion:No  Physical exam  Vitals:   02/09/21 0000 02/09/21 0400 02/09/21 0650  02/09/21 1218  BP: (!) 142/103 130/81 (!) 145/97 (!) 156/96  Pulse: 68 76 69 83  Resp:  17 18   Temp:   98.2 F (36.8 C)   TempSrc:   Oral   SpO2:  98% 100% 100%  Weight:      Height:       General: alert, cooperative and no distress Lochia: appropriate Uterine Fundus: firm Incision: Healing well with no significant drainage DVT Evaluation: No evidence of DVT seen on physical exam. Labs: Lab Results  Component Value Date   WBC 20.9 (H) 02/08/2021   HGB 9.4 (L) 02/08/2021   HCT 28.4 (L) 02/08/2021   MCV 89.9 02/08/2021   PLT 132 (L) 02/08/2021   CMP Latest Ref Rng & Units 02/08/2021  Glucose 70 - 99 mg/dL 111(H)  BUN 6 - 20 mg/dL 5(L)  Creatinine 0.44 - 1.00 mg/dL 0.68  Sodium 135 - 145 mmol/L 131(L)  Potassium 3.5 - 5.1 mmol/L 3.8  Chloride 98 - 111 mmol/L 100  CO2 22 - 32 mmol/L 25  Calcium 8.9 - 10.3 mg/dL 8.6(L)  Total Protein 6.5 - 8.1 g/dL 5.4(L)  Total Bilirubin 0.3 - 1.2 mg/dL 0.5  Alkaline Phos 38 - 126 U/L 138(H)  AST 15 - 41 U/L 34  ALT 0 - 44 U/L 19   Edinburgh Score: Edinburgh Postnatal Depression Scale  Screening Tool 02/07/2021  I have been able to laugh and see the funny side of things. 0  I have looked forward with enjoyment to things. 0  I have blamed myself unnecessarily when things went wrong. 1  I have been anxious or worried for no good reason. 1  I have felt scared or panicky for no good reason. 1  Things have been getting on top of me. 0  I have been so unhappy that I have had difficulty sleeping. 0  I have felt sad or miserable. 0  I have been so unhappy that I have been crying. 0  The thought of harming myself has occurred to me. 0  Edinburgh Postnatal Depression Scale Total 3     After visit meds:  Allergies as of 02/09/2021   No Known Allergies     Medication List    STOP taking these medications   aspirin EC 81 MG tablet   calcium carbonate 500 MG chewable tablet Commonly known as: TUMS - dosed in mg elemental calcium    pantoprazole 20 MG tablet Commonly known as: Protonix   promethazine 12.5 MG tablet Commonly known as: PHENERGAN   Vitafol Gummies 3.33-0.333-34.8 MG Chew     TAKE these medications   acetaminophen 500 MG tablet Commonly known as: TYLENOL Take 2 tablets (1,000 mg total) by mouth every 6 (six) hours as needed for moderate pain. What changed: reasons to take this   enalapril 10 MG tablet Commonly known as: VASOTEC Take 1 tablet (10 mg total) by mouth daily.   FeroSul 325 (65 FE) MG tablet Generic drug: ferrous sulfate Take 1 tablet (325 mg total) by mouth every other day. What changed: when to take this   ibuprofen 600 MG tablet Commonly known as: ADVIL Take 1 tablet (600 mg total) by mouth 4 (four) times daily.   lidocaine 2 % solution Commonly known as: XYLOCAINE Use as directed 1 mL in the mouth or throat as needed for mouth pain (place directly on sore tooth).   oxyCODONE 5 MG immediate release tablet Commonly known as: Oxy IR/ROXICODONE Take 1-2 tablets (5-10 mg total) by mouth every 6 (six) hours as needed for moderate pain or severe pain.      Discharge home in stable condition Infant Feeding: Bottle and Breast Infant Disposition:home with mother Discharge instruction: per After Visit Summary and Postpartum booklet. Activity: Advance as tolerated. Pelvic rest for 6 weeks.  Diet: routine diet Future Appointments: Future Appointments  Date Time Provider [place]  02/14/2021  1:20 PM [place] None  03/12/2021  1:00 PM Constant, Peggy, MD [facility] None   Follow up Visit:  Please schedule this patient for a In person postpartum visit in 4 weeks with the following provider: MD. Additional Postpartum F/U:Incision check 1 week and BP check 1 week  High risk pregnancy complicated by: preE w/o SF, hx prior CS Delivery mode:  C-Section, Low Transverse  Anticipated Birth Control:  Nexplanon   02/09/2021 Gabriel Carina, CNM

## 2021-02-07 NOTE — Transfer of Care (Signed)
Immediate Anesthesia Transfer of Care Note  Patient: Vanessa Wise  Procedure(s) Performed: CESAREAN SECTION (N/A )  Patient Location: PACU  Anesthesia Type:Epidural  Level of Consciousness: awake, alert  and oriented  Airway & Oxygen Therapy: Patient Spontanous Breathing  Post-op Assessment: Report given to RN and Post -op Vital signs reviewed and stable  Post vital signs: Reviewed and stable  Last Vitals:  Vitals Value Taken Time  BP 139/88 02/07/21 1546  Temp 36.7 C 02/07/21 1545  Pulse 71 02/07/21 1548  Resp 14 02/07/21 1548  SpO2 98 % 02/07/21 1548  Vitals shown include unvalidated device data.  Last Pain:  Vitals:   02/07/21 1545  TempSrc: Oral  PainSc:          Complications: No complications documented.

## 2021-02-07 NOTE — Progress Notes (Signed)
Vanessa Wise is a 35 y.o. [phone] at [redacted]w[redacted]d admitted for pre-e without severe features; TOLAC with 2 prior NSVD and c/section  Subjective: uncomfortable w/ contractions  Objective: BP 110/83   Pulse 82   Temp 98.1 F (36.7 C) (Oral)   Resp 17   Ht 5\' 4"  (1.626 m)   Wt 80.4 kg   SpO2 98%   BMI 30.43 kg/m  No intake/output data recorded.  FHT:  FHR: 140 bpm, variability: moderate,  accelerations:  Present,  decelerations:  Present Repetitive deep variables with contractions UC:   irregular, every 2-4 minutes  SVE:   Dilation: 10 Effacement (%): 80 Station: Plus 2 Exam by:: K Ebony Yorio CNM  Pitocin @ 8 mu/min  Labs: Lab Results  Component Value Date   WBC 7.7 02/07/2021   HGB 10.6 (L) 02/07/2021   HCT 32.0 (L) 02/07/2021   MCV 89.6 02/07/2021   PLT 149 (L) 02/07/2021   Patient Vitals for the past 24 hrs:  BP Temp Temp src Pulse Resp SpO2 Height Weight  02/07/21 1315 110/83 -- -- 82 -- -- -- --  02/07/21 1314 127/70 -- -- 69 -- -- -- --  02/07/21 1250 (!) 147/98 -- -- 80 -- 98 % -- --  02/07/21 1245 (!) 146/100 -- -- 80 -- 98 % -- --  02/07/21 1240 (!) 149/99 -- -- 73 -- 97 % -- --  02/07/21 1236 (!) 171/97 -- -- 79 -- -- -- --  02/07/21 1231 (!) 131/100 -- -- 75 -- -- -- --  02/07/21 1100 132/75 98.1 F (36.7 C) Oral 63 17 -- -- --  02/07/21 1007 122/80 -- -- 66 -- -- -- --  02/07/21 1006 122/80 -- -- 66 -- -- -- --  02/07/21 0933 (!) 122/92 -- -- 70 -- -- -- --  02/07/21 0847 128/77 -- -- 76 -- -- -- --  02/07/21 0722 135/71 (!) 97.3 F (36.3 C) Oral 76 -- -- -- --  02/07/21 0615 (!) 145/86 -- -- 79 -- -- -- --  02/07/21 0600 (!) 154/97 98 F (36.7 C) Oral 68 -- -- -- --  02/07/21 0530 (!) 150/93 -- -- 81 -- -- -- --  02/07/21 0525 (!) 151/95 -- -- 100 -- -- -- --  02/07/21 0500 134/79 -- -- 89 -- -- -- --  02/07/21 0440 (!) 148/96 -- -- 83 -- -- -- --  02/07/21 0435 (!) 158/94 -- -- 76 -- -- -- --  02/07/21 0116 -- -- -- -- -- -- 5\' 4"  (1.626 m) 80.4 kg   02/07/21 0030 (!) 149/88 98.3 F (36.8 C) Oral 82 16 98 % -- --     Assessment / Plan: In to see patient for repetitive deep variables, plan for amnioinfusion if no progress. Patient vomiting at the bedside. After SVE, patient was found to be complete + 2. Patient positioned and draped, upon reexamine two loops of cord in the vagina. Code Cesearian called, anesthesia notified and patient consented and brought to OR  BP are mild range; one severe range pressure but has not required treatment. Consider mgSo4 if continues after delivery.  Labor: Urgent c/section Fetal Wellbeing:  Category II Pain Control:  Epidural Pre-eclampsia: asymptomatic I/D:  GBS neg Anticipated MOD: urgent c/section  Tarena Gockley CNM 02/07/2021, 2:25 PM

## 2021-02-07 NOTE — Anesthesia Postprocedure Evaluation (Signed)
Anesthesia Post Note  Patient: Vanessa Wise  Procedure(s) Performed: CESAREAN SECTION (N/A )     Patient location during evaluation: Mother Baby Anesthesia Type: Epidural Level of consciousness: oriented and awake and alert Pain management: pain level controlled Vital Signs Assessment: post-procedure vital signs reviewed and stable Respiratory status: spontaneous breathing and respiratory function stable Cardiovascular status: blood pressure returned to baseline and stable Postop Assessment: no headache, no backache, no apparent nausea or vomiting and able to ambulate Anesthetic complications: no   No complications documented.  Last Vitals:  Vitals:   02/07/21 1615 02/07/21 1630  BP: (!) 146/98 138/76  Pulse: 63 74  Resp: 13 17  Temp:  36.7 C  SpO2: 100% 98%    Last Pain:  Vitals:   02/07/21 1630  TempSrc: Oral  PainSc:    Pain Goal:                   Trevor Iha

## 2021-02-08 ENCOUNTER — Other Ambulatory Visit: Payer: Self-pay | Admitting: *Deleted

## 2021-02-08 DIAGNOSIS — Q270 Congenital absence and hypoplasia of umbilical artery: Secondary | ICD-10-CM

## 2021-02-08 LAB — CBC
HCT: 28.4 % — ABNORMAL LOW (ref 36.0–46.0)
Hemoglobin: 9.4 g/dL — ABNORMAL LOW (ref 12.0–15.0)
MCH: 29.7 pg (ref 26.0–34.0)
MCHC: 33.1 g/dL (ref 30.0–36.0)
MCV: 89.9 fL (ref 80.0–100.0)
Platelets: 132 10*3/uL — ABNORMAL LOW (ref 150–400)
RBC: 3.16 MIL/uL — ABNORMAL LOW (ref 3.87–5.11)
RDW: 13.7 % (ref 11.5–15.5)
WBC: 20.9 10*3/uL — ABNORMAL HIGH (ref 4.0–10.5)
nRBC: 0 % (ref 0.0–0.2)

## 2021-02-08 LAB — COMPREHENSIVE METABOLIC PANEL
ALT: 19 U/L (ref 0–44)
AST: 34 U/L (ref 15–41)
Albumin: 2.1 g/dL — ABNORMAL LOW (ref 3.5–5.0)
Alkaline Phosphatase: 138 U/L — ABNORMAL HIGH (ref 38–126)
Anion gap: 6 (ref 5–15)
BUN: 5 mg/dL — ABNORMAL LOW (ref 6–20)
CO2: 25 mmol/L (ref 22–32)
Calcium: 8.6 mg/dL — ABNORMAL LOW (ref 8.9–10.3)
Chloride: 100 mmol/L (ref 98–111)
Creatinine, Ser: 0.68 mg/dL (ref 0.44–1.00)
GFR, Estimated: >60 mL/min (ref >60)
Glucose, Bld: 111 mg/dL — ABNORMAL HIGH (ref 70–99)
Potassium: 3.8 mmol/L (ref 3.5–5.1)
Sodium: 131 mmol/L — ABNORMAL LOW (ref 135–145)
Total Bilirubin: 0.5 mg/dL (ref 0.3–1.2)
Total Protein: 5.4 g/dL — ABNORMAL LOW (ref 6.5–8.1)

## 2021-02-08 MED ORDER — CALCIUM CARBONATE ANTACID 500 MG PO CHEW
1.0000 | CHEWABLE_TABLET | Freq: Three times a day (TID) | ORAL | Status: DC | PRN
  Administered 2021-02-08: 200 mg via ORAL
  Filled 2021-02-08: qty 1

## 2021-02-08 MED ORDER — FERROUS SULFATE 325 (65 FE) MG PO TABS
325.0000 mg | ORAL_TABLET | ORAL | 1 refills | Status: AC
  Filled 2021-02-08: qty 15, 30d supply, fill #0

## 2021-02-08 MED ORDER — LABETALOL HCL 5 MG/ML IV SOLN
20.0000 mg | INTRAVENOUS | Status: DC | PRN
  Administered 2021-02-08: 20 mg via INTRAVENOUS
  Filled 2021-02-08: qty 4

## 2021-02-08 MED ORDER — IBUPROFEN 600 MG PO TABS
600.0000 mg | ORAL_TABLET | Freq: Four times a day (QID) | ORAL | 0 refills | Status: DC | PRN
  Filled 2021-02-08: qty 30, 8d supply, fill #0

## 2021-02-08 MED ORDER — LABETALOL HCL 5 MG/ML IV SOLN
40.0000 mg | INTRAVENOUS | Status: DC | PRN
  Filled 2021-02-08: qty 8

## 2021-02-08 MED ORDER — OXYCODONE HCL 5 MG PO TABS
5.0000 mg | ORAL_TABLET | Freq: Four times a day (QID) | ORAL | 0 refills | Status: DC | PRN
  Filled 2021-02-08: qty 15, 2d supply, fill #0

## 2021-02-08 MED ORDER — LABETALOL HCL 5 MG/ML IV SOLN: 80.0000 mg | INTRAVENOUS | Status: DC | PRN

## 2021-02-08 MED ORDER — ENALAPRIL MALEATE 10 MG PO TABS
10.0000 mg | ORAL_TABLET | Freq: Every day | ORAL | 11 refills | Status: AC
  Filled 2021-02-08: qty 30, 30d supply, fill #0

## 2021-02-08 MED ORDER — FERROUS SULFATE 325 (65 FE) MG PO TABS
325.0000 mg | ORAL_TABLET | ORAL | Status: DC
  Administered 2021-02-08: 325 mg via ORAL
  Filled 2021-02-08: qty 1

## 2021-02-08 NOTE — Progress Notes (Signed)
Patient's BP was taken at 2041 --> 160/101. BP was taken again on the opposite arm and it was 153/96. Patient had Norvasc due at 2200. Due to patients blood pressure ranges, Norvasc was given at 2142. Patient was asked if she had any pain under her right breast, severe headache, and visual disturbances. The patient stated "I feel like y'all are trying to make something happen that is not going to happen. When I had my soon four years ago I had pre-eclampsia. I was 350 pounds and could not remember anything because I was put on Mag. I don't remember anything about my sons birth or about myself then. So I will not be taking that again. I was given every medicine in the hospital before they decided to do something about the issue. My heart rate started dropping and my sons started dropping and I had to be rushed to the OR for an emergency c-section. I don't feel any of that like I did with my son so no I will not be taking any of that."  RN called physician and midwife Christin Fudge came to assess patient. Midwife discussed with patient that she had severe pressures and met the criteria for pre-eclampsia. The patient refused magnesium, but agreed to starting the labetalol. Patient signed form refusing magnesium. Patient expressed to midwife that she did not feel safe, that she would like to leave, and that she has not had a great experience here. Patient stated "I do not want anything else done that does not absolutely have to be."   Patient stated "I do not feel safe here and I don't feel like these doctors have my best interest at heart. If I have to stay here another day, I'm going to transfer to a different hospital."   RN went to give patient first dose of labetalol and initiate seizure precautions and patient refused padding. Patient stated "I believe in God, he's covered me and I feel like are trying to bring the devil in with all of this. I'm going to just chill out and go to sleep." Patient  was given $RemoveB'20mg'WBmaIxZt$  of labetalol.   Patients blood pressures after $RemoveBeforeD'20mg'PzSJrmWpDuNhcn$  of labetalol: 151/109, 153/106, 142/103. Will continue to follow protocol and orders.

## 2021-02-08 NOTE — Progress Notes (Signed)
POSTPARTUM PROGRESS NOTE  Subjective: Vanessa Wise is a 35 y.o. [phone] s/p pLTCS at [redacted]w[redacted]d secondary to cord prolapse.  She reports she doing well. No acute events overnight. She denies any problems with ambulating, voiding or po intake. Denies nausea or vomiting. She has passed flatus. Pain is well controlled.  Lochia is minimal. Pt does endorse tooth pain this morning.  Objective: Blood pressure (!) 141/91, pulse 74, temperature 97.9 F (36.6 C), temperature source Oral, resp. rate 16, height 5\' 4"  (1.626 m), weight 80.4 kg, SpO2 99 %, currently breastfeeding.  Physical Exam:  General: alert, cooperative and no distress Chest: no respiratory distress Abdomen: soft, non-tender, honeycomb dressing c/d/i Uterine Fundus: firm and at level of umbilicus Extremities: No calf swelling or tenderness  no LE edema  Recent Labs    02/07/21 0009 02/07/21 1047  HGB 10.2* 10.6*  HCT 30.2* 32.0*    Assessment/Plan: Vanessa Wise is a 35 y.o. [phone] s/p pLTCS at [redacted]w[redacted]d secondary to cord prolapse.  Routine Postpartum Care: Doing well, pain well-controlled.  -- Continue routine care, lactation support  -- Contraception: plan for Nexplanon prior to discharge -- Feeding: breast & formula -- Mild Preeclampsia: started norvasc 10 mg daily given up-trending blood pressures. -- Tobacco Use: encouraged cessation. Pt declined nicotine patch. -- Late Prenatal Care: SW consult ordered. -- Tooth Pain: pain meds prn. Pt has dental appt scheduled.  Dispo: Plan for discharge POD#2-3.  [redacted]w[redacted]d, MD OB Fellow, [facility] 02/08/2021 3:21 AM

## 2021-02-08 NOTE — Progress Notes (Signed)
Patient Vitals for the past 4 hrs:  BP Temp Temp src Pulse Resp SpO2  02/08/21 2315 (!) 151/104 -- -- 72 -- --  02/08/21 2241 (!) 163/108 -- -- 79 -- --  02/08/21 2100 (!) 151/102 -- -- 75 -- --  02/08/21 2048 (!) 153/96 -- -- -- -- --  02/08/21 2041 (!) 160/101 98.1 F (36.7 C) Oral 91 17 99 %   Called by RN to report BPs and pt's anger.  I went to the room, sat down and prepared to talk to Ms Osso about her severe range blood pressures.  She immediately let me know that she is extremely unhappy with "everything about this place", "nobody hears me", and "I don't want to tell you anything else".  I validated her feelings, let her know that I was listening and wanted to hear more about her frustrations and help her in any way that I could.  Ms. Delpino said that she "didn't like any of my doctors or nurses" and that (she) "is not getting good care here".  I asked again to please elaborate and pt declined.  Went on to discuss ACOG recommendations of MgS04 for seizure prevention, and she refused.  "I know my body, and I feel fine".  " I don't remember anything about my son's birth 4 years ago because of the mag, and I'm not taking that again."  I informed pt that she is also at risk for stroke and possibly death.  She said "that's not going to happen to me" and "my blood pressure is up because I am upset and have been eating things I shouldn't be eating" and adamantly refused magnesium. She signed an AMA paper acknowledging this refusal. She did accept IV labetalol.  Pt wishes to be discharged ASAP, and if her baby isn't discharged tomorrow she wants to be transferred "to another facility".  I told her I would process her discharge tomorrow if she is stable.  Pt remained upset, irritated, defensive and angry throughout the encounter.  Dr. Donavan Foil notified of all of the above.

## 2021-02-08 NOTE — Clinical Social Work Maternal (Addendum)
CLINICAL SOCIAL WORK MATERNAL/CHILD NOTE  Patient Details  Name: Vanessa Wise MRN: [id number] Date of Birth: 1986-10-01  Date:  02/08/2021  Clinical Social Worker Initiating Note:  Kathrin Greathouse, [place] Date/Time: Initiated:  02/08/21/0959     Child's Name:  Vanessa Wise (MOB not sure of the last name)   Biological Parents:  Mother   Need for Interpreter:  None   Reason for Referral:  Current Substance Use/Substance Use During Pregnancy ,[place] or No Prenatal Care    Address:  [address] [place] [address]    Phone number:  [phone] (home)     Additional phone number:  Household Members/Support Persons (HM/SP):    MOB refused to provide the names of the individuals in the household.    HM/SP Name Relationship DOB or Age  HM/SP -1        HM/SP -2        HM/SP -3        HM/SP -4        HM/SP -5        HM/SP -6        HM/SP -7        HM/SP -8          Natural Supports (not living in the home):      Professional Supports:     Employment: Full-time   Type of Work: [employer]   Education:  9 to 11 years   Homebound arranged: No  Financial Resources:  Medicaid   Other Resources:    WIC/FS  Cultural/Religious Considerations Which May Impact Care:    Strengths:  Ability to meet basic needs ,Home prepared for child    Psychotropic Medications:         Pediatrician:       Pediatrician List:   [place]      Pediatrician Fax Number:    Risk Factors/Current Problems:  Substance Use    Cognitive State:  Able to Concentrate ,Alert   Mood/Affect:  Blunted ,Flat    CSW Assessment:  CSW received consult for substance use during pregnancy. CSW met with MOB to offer support and complete assessment.    CSW entered MOB room. MOB immediately asked, "who are you." CSW introduced role. CSW observed MOB feeding and  bonding with the infant and her support person sleeping on the couch. MOB presented very guarded. CSW suggested to return at a different time to offer MOB privacy. MOB asked, "what do you want, why are you here." CSW moved closer to bedside and inform MOB CSW was there to complete an assessment and notify her of the hospital's substance use policy. MOB asked, "why." CSW explain the the substance use policy and inform her the infant's urine was positive for THC. MOB then asked why she needed to complete an assessment if CPS was coming out to her home. CSW explain to MOB the assessment is to provide education and offer resources. MOB agreeable to answer "some questions." MOB refused to answer questions about late prenatal care. CSW asked MOB about her supports and FOB. MOB declined to answer. CSW asked MOB who lives in the household. MOB reports," just me and three kids, now [age] (pointing to the infant)." CSW asked MOB the names of her children. MOB stated that CSW could look in the hospital system and find  the name of her children, since CSW had access to other documents. CSW asked infants name. MOB reports Vanessa Wise and has not determine the last name.   CSW inquired about MOB mental health history with anxiety and depression. MOB denied mental health history of depression and anxiety. MOB denied receiving medication or received therapy services for mental health.  CSW assessed MOB for safety, MOB denies suicidal or homicidal ideations, and domestic violence. CSW asked MOB about history of postpartum. MOB denies history. CSW provided education regarding the baby blues period vs. perinatal mood disorders, discussed treatment and offered resources for mental health follow up. MOB declined resources.   CSW recommended MOB complete a self-evaluation during the postpartum time period using the New Mom Checklist from Postpartum Progress and encouraged MOB to contact a medical professional if symptoms are noted at any  time. MOB declined the postpartum check list.   CSW provided review of Sudden Infant Death Syndrome (SIDS) precautions and inform MOB no co-sleeping with the infant. CSW asked MOB if the infant had a safe sleep space. MOB reports the infant has a place to sleep. CSW asked MOB if she has items for the infant. MOB reports she has items for the infant. CSW asked MOB is she is receiving food stamps and WIC. MOB reports she is receiving both food stamps and WIC.   CSW asked MOB about her substance use during pregnancy. MOB states she used marijuana during her pregnancy "to pick up my appetite". CSW explain the hospital drug screen policy and informed MOB a report will be made to CPS. MOB responded, "ok. CSW asked MOB if she had any questions. MOB had no questions. CSW asked MOB if she had CPS history or current case. MOB reports she had history with CPS. MOB reports she cursed out her son's doctor and CPS was notified. MOB reports the case was open and closed the same day.   MOB reports she has not chosen a pediatrician for the infant. CSW asked MOB if she has transportation. MOB says she is in the process of buying a car and will have transportation. CSW assessed MOB for additional needs. MOB reports no further need.   CPS report made to [place].   CSW identifies no further need for intervention and no barriers to discharge at this time.   CSW Plan/Description:  CSW Will Continue to Monitor Umbilical Cord Tissue Drug Screen Results and Make Report if [place] Information,Child Protective Service Report ,Sudden Infant Death Syndrome (SIDS) Education,Perinatal Mood and Anxiety Disorder (PMADs) Education,No Further Intervention Required/No Barriers to Discharge    Lia Hopping, LCSW 02/08/2021, 10:05 AM

## 2021-02-09 ENCOUNTER — Other Ambulatory Visit (HOSPITAL_COMMUNITY): Payer: Self-pay

## 2021-02-09 LAB — SURGICAL PATHOLOGY

## 2021-02-09 MED ORDER — LIDOCAINE VISCOUS HCL 2 % MT SOLN
1.0000 mL | OROMUCOSAL | Status: DC | PRN
  Filled 2021-02-09 (×3): qty 15

## 2021-02-09 MED ORDER — IBUPROFEN 600 MG PO TABS
600.0000 mg | ORAL_TABLET | Freq: Four times a day (QID) | ORAL | Status: DC
  Administered 2021-02-09 (×2): 600 mg via ORAL
  Filled 2021-02-09: qty 1

## 2021-02-09 MED ORDER — LIDOCAINE VISCOUS HCL 2 % MT SOLN
1.0000 mL | OROMUCOSAL | 0 refills | Status: DC | PRN
  Filled 2021-02-09: qty 15, fill #0

## 2021-02-09 MED ORDER — ACETAMINOPHEN 500 MG PO TABS
1000.0000 mg | ORAL_TABLET | Freq: Four times a day (QID) | ORAL | 0 refills | Status: AC | PRN
  Filled 2021-02-09: qty 30, 4d supply, fill #0

## 2021-02-09 MED ORDER — ACETAMINOPHEN 500 MG PO TABS: 1000.0000 mg | ORAL_TABLET | Freq: Four times a day (QID) | ORAL | Status: DC | PRN

## 2021-02-09 MED ORDER — IBUPROFEN 600 MG PO TABS
600.0000 mg | ORAL_TABLET | Freq: Four times a day (QID) | ORAL | 0 refills | Status: DC
  Filled 2021-02-09: qty 30, 8d supply, fill #0

## 2021-02-09 NOTE — Progress Notes (Signed)
RN notified Midwife of patients increased toothache. Patient stated "I will do whatever at this point because I'm in so much pain." Patient had been give 10mg  of oxy at 2332 for the pain. During reassessment, per patient the pain level had decreased to an 8-9. Midwife contacted and it was recommenced to give ibuprofen instead of scheduled tylenol and there would be an order for PRN liquid lidocaine mouth solution that could be applied directly to the sore tooth as needed. The patient stated that she would be receptive of taking the ibuprofen and having the oral solution if needed. Ibuprofen was given at 0108.   RN will continue to monitor patients pain level and blood pressures per orders.

## 2021-02-09 NOTE — Discharge Instructions (Signed)
Cesarean Delivery, Care After This sheet gives you information about how to care for yourself after your procedure. Your health care provider may also give you more specific instructions. If you have problems or questions, contact your health care provider. What can I expect after the procedure? After the procedure, it is common to have:  A small amount of blood or clear fluid coming from the incision.  Some redness, swelling, and pain in your incision area.  Some abdominal pain and soreness.  Vaginal bleeding (lochia). Even though you did not have a vaginal delivery, you will still have vaginal bleeding and discharge.  Pelvic cramps.  Fatigue. You may have pain, swelling, and discomfort in the tissue between your vagina and your anus (perineum) if:  Your C-section was unplanned, and you were allowed to labor and push.  An incision was made in the area (episiotomy) or the tissue tore during attempted vaginal delivery. Follow these instructions at home: Incision care  Follow instructions from your health care provider about how to take care of your incision. Make sure you: ? Wash your hands with soap and water before you change your bandage (dressing). If soap and water are not available, use hand sanitizer. ? If you have a dressing, change it or remove it as told by your health care provider. ? Leave stitches (sutures), skin staples, skin glue, or adhesive strips in place. These skin closures may need to stay in place for 2 weeks or longer. If adhesive strip edges start to loosen and curl up, you may trim the loose edges. Do not remove adhesive strips completely unless your health care provider tells you to do that.  Check your incision area every day for signs of infection. Check for: ? More redness, swelling, or pain. ? More fluid or blood. ? Warmth. ? Pus or a bad smell.  Do not take baths, swim, or use a hot tub until your health care provider says it's okay. Ask your health  care provider if you can take showers.  When you cough or sneeze, hug a pillow. This helps with pain and decreases the chance of your incision opening up (dehiscing). Do this until your incision heals.   Medicines  Take over-the-counter and prescription medicines only as told by your health care provider.  If you were prescribed an antibiotic medicine, take it as told by your health care provider. Do not stop taking the antibiotic even if you start to feel better.  Do not drive or use heavy machinery while taking prescription pain medicine. Lifestyle  Do not drink alcohol. This is especially important if you are breastfeeding or taking pain medicine.  Do not use any products that contain nicotine or tobacco, such as cigarettes, e-cigarettes, and chewing tobacco. If you need help quitting, ask your health care provider. Eating and drinking  Drink at least 8 eight-ounce glasses of water every day unless told not to by your health care provider. If you breastfeed, you may need to drink even more water.  Eat high-fiber foods every day. These foods may help prevent or relieve constipation. High-fiber foods include: ? Whole grain cereals and breads. ? Brown rice. ? Beans. ? Fresh fruits and vegetables. Activity  If possible, have someone help you care for your baby and help with household activities for at least a few days after you leave the hospital.  Return to your normal activities as told by your health care provider. Ask your health care provider what activities are safe for   you.  Rest as much as possible. Try to rest or take a nap while your baby is sleeping.  Do not lift anything that is heavier than 10 lbs (4.5 kg), or the limit that you were told, until your health care provider says that it is safe.  Talk with your health care provider about when you can engage in sexual activity. This may depend on your: ? Risk of infection. ? How fast you heal. ? Comfort and desire to  engage in sexual activity.   General instructions  Do not use tampons or douches until your health care provider approves.  Wear loose, comfortable clothing and a supportive and well-fitting bra.  Keep your perineum clean and dry. Wipe from front to back when you use the toilet.  If you pass a blood clot, save it and call your health care provider to discuss. Do not flush blood clots down the toilet before you get instructions from your health care provider.  Keep all follow-up visits for you and your baby as told by your health care provider. This is important. Contact a health care provider if:  You have: ? A fever. ? Bad-smelling vaginal discharge. ? Pus or a bad smell coming from your incision. ? Difficulty or pain when urinating. ? A sudden increase or decrease in the frequency of your bowel movements. ? More redness, swelling, or pain around your incision. ? More fluid or blood coming from your incision. ? A rash. ? Nausea. ? Little or no interest in activities you used to enjoy. ? Questions about caring for yourself or your baby.  Your incision feels warm to the touch.  Your breasts turn red or become painful or hard.  You feel unusually sad or worried.  You vomit.  You pass a blood clot from your vagina.  You urinate more than usual.  You are dizzy or light-headed. Get help right away if:  You have: ? Pain that does not go away or get better with medicine. ? Chest pain. ? Difficulty breathing. ? Blurred vision or spots in your vision. ? Thoughts about hurting yourself or your baby. ? New pain in your abdomen or in one of your legs. ? A severe headache.  You faint.  You bleed from your vagina so much that you fill more than one sanitary pad in one hour. Bleeding should not be heavier than your heaviest period. Summary  After the procedure, it is common to have pain at your incision site, abdominal cramping, and slight bleeding from your vagina.  Check  your incision area every day for signs of infection.  Tell your health care provider about any unusual symptoms.  Keep all follow-up visits for you and your baby as told by your health care provider. This information is not intended to replace advice given to you by your health care provider. Make sure you discuss any questions you have with your health care provider. Document Revised: 04/29/2018 Document Reviewed: 04/29/2018 Elsevier Patient Education  2021 Elsevier Inc.  

## 2021-02-14 ENCOUNTER — Ambulatory Visit (INDEPENDENT_AMBULATORY_CARE_PROVIDER_SITE_OTHER): Payer: Medicaid Other

## 2021-02-14 ENCOUNTER — Other Ambulatory Visit: Payer: Self-pay

## 2021-02-14 VITALS — BP 141/102

## 2021-02-14 DIAGNOSIS — Z0130 Encounter for examination of blood pressure without abnormal findings: Secondary | ICD-10-CM

## 2021-02-14 DIAGNOSIS — Z4889 Encounter for other specified surgical aftercare: Secondary | ICD-10-CM

## 2021-02-14 NOTE — Progress Notes (Signed)
Subjective:  Vanessa Wise is a 35 y.o. female here for BP check and wound check s/p TV C/S on 02/07/2021. Pt admits to not taking enalapril 10 mg daily. She did take it last night at 8 pm.  Hypertension ROS: not taking medications as instructed, no medication side effects noted, no TIA's, no chest pain on exertion, no dyspnea on exertion and no swelling of ankles   Objective:  BP 152/110 repeat 141/102  Asymptomatic, appearance alert, well appearing, and in no distress and oriented to person, place, and time. General exam BP noted to be not well controlled today in office.     Assessment:   Blood Pressure need improvement.   The wound is CDI. The patient is alerted to watch for any signs of infection (redness, pus, pain, increased swelling or fever) and call if such occurs. Home wound care instructions are provided.   Plan:  Take BP meds daily as instructed   1 week follow up BP check

## 2021-02-15 NOTE — Progress Notes (Signed)
Patient was assessed and managed by nursing staff during this encounter. I have reviewed the chart and agree with the documentation and plan. I have also made any necessary editorial changes.  Warden Fillers, MD 02/15/2021 8:06 AM

## 2021-02-19 ENCOUNTER — Ambulatory Visit: Payer: Medicaid Other

## 2021-02-20 ENCOUNTER — Ambulatory Visit: Payer: Medicaid Other

## 2021-02-20 ENCOUNTER — Ambulatory Visit: Payer: Self-pay

## 2021-03-12 ENCOUNTER — Ambulatory Visit (INDEPENDENT_AMBULATORY_CARE_PROVIDER_SITE_OTHER): Payer: Medicaid Other | Admitting: Obstetrics and Gynecology

## 2021-03-12 ENCOUNTER — Encounter: Payer: Self-pay | Admitting: Obstetrics and Gynecology

## 2021-03-12 ENCOUNTER — Other Ambulatory Visit: Payer: Self-pay

## 2021-03-12 ENCOUNTER — Encounter: Payer: Self-pay | Admitting: Obstetrics

## 2021-03-12 DIAGNOSIS — Z3042 Encounter for surveillance of injectable contraceptive: Secondary | ICD-10-CM

## 2021-03-12 LAB — POCT URINE PREGNANCY: Preg Test, Ur: NEGATIVE

## 2021-03-12 MED ORDER — MEDROXYPROGESTERONE ACETATE 150 MG/ML IM SUSP
150.0000 mg | Freq: Once | INTRAMUSCULAR | Status: AC
  Administered 2021-03-12: 150 mg via INTRAMUSCULAR

## 2021-03-12 MED ORDER — MEDROXYPROGESTERONE ACETATE 150 MG/ML IM SUSP: 150.0000 mg | INTRAMUSCULAR | 5 refills | Status: DC

## 2021-03-12 NOTE — Progress Notes (Signed)
Post Partum Visit Note  Vanessa Wise is a 35 y.o. [phone] female who presents for a postpartum visit. She is 4 weeks postpartum following a primary cesarean section.  I have fully reviewed the prenatal and intrapartum course. The delivery was at 37 gestational weeks.  Anesthesia: spinal. Postpartum course has been uncomplicated. Baby is doing well. Baby is feeding by bottle - Lucien Mons Start. Bleeding staining only. Bowel function is normal. Bladder function is normal. Patient is not sexually active. Contraception method is abstinence. Postpartum depression screening: negative.     Edinburgh Postnatal Depression Scale - 03/12/21 1331      Edinburgh Postnatal Depression Scale:  In the Past 7 Days   I have been able to laugh and see the funny side of things. 0    I have looked forward with enjoyment to things. 0    I have blamed myself unnecessarily when things went wrong. 0    I have been anxious or worried for no good reason. 0    I have felt scared or panicky for no good reason. 0    Things have been getting on top of me. 0    I have been so unhappy that I have had difficulty sleeping. 0    I have felt sad or miserable. 0    I have been so unhappy that I have been crying. 0    The thought of harming myself has occurred to me. 0    Edinburgh Postnatal Depression Scale Total 0           Health Maintenance Due  Topic Date Due  . COVID-19 Vaccine (1) Never done       Review of Systems Pertinent items noted in HPI and remainder of comprehensive ROS otherwise negative.  Objective:  BP (!) 155/113   Pulse 72   Ht 5\' 4"  (1.626 m)   Wt 150 lb 9.6 oz (68.3 kg)   Breastfeeding No   BMI 25.85 kg/m    General:  alert, cooperative and no distress   Breasts:  normal  Lungs: clear to auscultation bilaterally  Heart:  regular rate and rhythm  Abdomen: soft, non-tender; bowel sounds normal; no masses,  no organomegaly   Wound well approximated incision  GU exam:  not  indicated       Assessment:    There are no diagnoses linked to this encounter.  Normal postpartum exam.   Plan:   Essential components of care per ACOG recommendations:  1.  Mood and well being: Patient with negative depression screening today. Reviewed local resources for support.  - Patient tobacco use? No.   - hx of drug use? No.    2. Infant care and feeding:  -Patient currently breastmilk feeding? No.  -Social determinants of health (SDOH) reviewed in EPIC. No concerns  3. Sexuality, contraception and birth spacing - Patient does not want a pregnancy in the next year.  Desired family size is 3 children.  - Reviewed forms of contraception in tiered fashion. Patient desired Depo-Provera, vasectomy today.   - Discussed birth spacing of 18 months  4. Sleep and fatigue -Encouraged family/partner/community support of 4 hrs of uninterrupted sleep to help with mood and fatigue  5. Physical Recovery  - Discussed patients delivery and complications. She describes her labor as good. - Patient had a C-section repeat; no problems after deliver.  Patient expressed understanding - Patient has urinary incontinence? No. - Patient is safe to resume physical and sexual  activity  6.  Health Maintenance - HM due items addressed Yes - Last pap smear  Diagnosis  Date Value Ref Range Status  01/08/2021   Final   - Negative for Intraepithelial Lesions or Malignancy (NILM)  01/08/2021 - Benign reactive/reparative changes  Final   Pap smear not done at today's visit.  -Breast Cancer screening indicated? No.   7. Chronic Disease/Pregnancy Condition follow up: Hypertension Patient admits to not taking her BP medication as prescribed. She plans on taking it going forward and understands the risk of stoke and CVD associated with poorly controlled BP. Patient to return in 1 month for BP check  - PCP follow up  Catalina Antigua, MD [facility], [facility]

## 2021-04-12 ENCOUNTER — Ambulatory Visit: Payer: Medicaid Other

## 2021-05-06 ENCOUNTER — Other Ambulatory Visit: Payer: Self-pay

## 2021-05-06 ENCOUNTER — Emergency Department (HOSPITAL_COMMUNITY)
Admission: EM | Admit: 2021-05-06 | Discharge: 2021-05-06 | Disposition: A | Discharge status: Home / Self Care | Payer: Medicaid Other | Attending: Emergency Medicine | Admitting: Emergency Medicine

## 2021-05-06 ENCOUNTER — Encounter (HOSPITAL_COMMUNITY): Payer: Self-pay | Admitting: Emergency Medicine

## 2021-05-06 DIAGNOSIS — K0889 Other specified disorders of teeth and supporting structures: Secondary | ICD-10-CM

## 2021-05-06 DIAGNOSIS — F17210 Nicotine dependence, cigarettes, uncomplicated: Secondary | ICD-10-CM | POA: Insufficient documentation

## 2021-05-06 MED ORDER — AMOXICILLIN-POT CLAVULANATE 875-125 MG PO TABS: 1.0000 | ORAL_TABLET | Freq: Two times a day (BID) | ORAL | 0 refills | Status: AC

## 2021-05-06 MED ORDER — IBUPROFEN 400 MG PO TABS
800.0000 mg | ORAL_TABLET | Freq: Once | ORAL | Status: AC
  Administered 2021-05-06: 800 mg via ORAL
  Filled 2021-05-06: qty 2

## 2021-05-06 MED ORDER — BENZOCAINE 10 % MT GEL: 1.0000 "application " | OROMUCOSAL | 0 refills | Status: DC | PRN

## 2021-05-06 NOTE — ED Provider Notes (Signed)
[facility] EMERGENCY DEPARTMENT Provider Note   CSN: [id number] Arrival date & time: 05/06/21  1644     History Chief Complaint  Patient presents with   Dental Pain    Vanessa Wise is a 35 y.o. female who is 3 months postpartum who presents with concern for left upper dental pain that started late in her pregnancy with her daughter.  Patient states that she was not able to have any dental procedures during her pregnancy was complicated by preeclampsia.  States that her left upper quadrant pain has been getting worse, she has not been able to sleep for the last several nights well, is tearful in triage secondary to pain.  She is breast-feeding in addition to formula feeding.  No fevers, chills, nausea, vomiting, difficulty swallowing, or swelling in her neck.  I personally reviewed this patient's medical records.  She has history of polysubstance usage, high risk pregnancy, migraines, IBS.   HPI     Past Medical History:  Diagnosis Date   [redacted] weeks gestation of pregnancy    Anxiety    Chlamydia infection 02/03/2012   IBS (irritable bowel syndrome)    Migraines    Pain due to intrauterine contraceptive device (IUD) (HCC) 01/17/2015   S/p hysteroscopic removal 01/17/2015    Seasonal allergies     Patient Active Problem List   Diagnosis Date Noted   Supervision of high risk pregnancy, antepartum 02/07/2021   History of 2 cesarean sections 02/07/2021   [redacted] weeks gestation of pregnancy 01/21/2021   Biological false-positive (BFP) syphilis serology test 01/19/2021   Vaginal bleeding in pregnancy, third trimester 01/19/2021   Fetal abnormality in antepartum pregnancy 12/19/2020   Late prenatal care, antepartum 12/19/2020   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   Hx of preeclampsia, prior pregnancy, currently pregnant 06/26/2016   Cannabis dependence 01/28/2012   Chronic migraine 01/28/2012   TOBACCO USER 11/06/2009    Past Surgical History:  Procedure  Laterality Date   CESAREAN SECTION N/A 07/07/2016   Procedure: CESAREAN SECTION;  Surgeon: Adam Phenix, MD;  Location: [facility] BIRTHING SUITES;  Service: Obstetrics;  Laterality: N/A;   CESAREAN SECTION N/A 02/07/2021   Procedure: CESAREAN SECTION;  Surgeon: Kathrynn Running, MD;  Location: [facility] LD ORS;  Service: Obstetrics;  Laterality: N/A;   HYSTEROSCOPY N/A 01/17/2015   Procedure: HYSTEROSCOPY WITH REMOVAL INTRAUTERINE DIVICE;  Surgeon: Willodean Rosenthal, MD;  Location: [facility] ORS;  Service: Gynecology;  Laterality: N/A;   IUD REMOVAL     MOUTH SURGERY     NO PAST SURGERIES       OB History     Gravida  4   Para  4   Term  2   Preterm  2   AB      Living  3      SAB      IAB      Ectopic      Multiple  0   Live Births  3           Family History  Problem Relation Age of Onset   Hypertension Mother     Social History   Tobacco Use   Smoking status: Every Day    Packs/day: 1.00    Years: 9.00    Pack years: 9.00    Types: Cigarettes   Smokeless tobacco: Never   Tobacco comments:    1 pack or less/ day  Vaping Use   Vaping Use: Never used  Substance  Use Topics   Alcohol use: No    Comment: rare   Drug use: Not Currently    Types: Marijuana    Comment: Last used 06/25/16    Home Medications Prior to Admission medications   Medication Sig Start Date End Date Taking? Authorizing Provider  acetaminophen (TYLENOL) 500 MG tablet Take 2 tablets (1,000 mg total) by mouth every 6 (six) hours as needed for moderate pain. 02/09/21   Bernerd Limbo, CNM  enalapril (VASOTEC) 10 MG tablet Take 1 tablet (10 mg total) by mouth daily. 02/08/21 02/08/22  Cresenzo-Dishmon, Scarlette Calico, CNM  ferrous sulfate 325 (65 FE) MG tablet Take 1 tablet (325 mg total) by mouth every other day. 02/08/21   Cresenzo-Dishmon, Scarlette Calico, CNM  ibuprofen (ADVIL) 600 MG tablet Take 1 tablet (600 mg total) by mouth 4 (four) times daily. 02/09/21   Bernerd Limbo, CNM  lidocaine (XYLOCAINE) 2 %  solution Use as directed 1 mL in the mouth or throat as needed for mouth pain (place directly on sore tooth). 02/09/21   Bernerd Limbo, CNM  medroxyPROGESTERone (DEPO-PROVERA) 150 MG/ML injection Inject 1 mL (150 mg total) into the muscle every 3 (three) months. 03/12/21   Constant, Peggy, MD    Allergies    Patient has no known allergies.  Review of Systems   Review of Systems  Constitutional: Negative.   HENT:  Positive for dental problem. Negative for congestion, drooling, ear discharge, ear pain, facial swelling, sore throat, trouble swallowing and voice change.   Eyes: Negative.   Respiratory: Negative.    Cardiovascular: Negative.   Gastrointestinal: Negative.   Musculoskeletal: Negative.   Neurological: Negative.    Physical Exam Updated Vital Signs BP 119/88 (BP Location: Left Arm)   Pulse 87   Temp 99.6 F (37.6 C) (Oral)   Resp 18   SpO2 100%   Physical Exam Vitals and nursing note reviewed.  Constitutional:      Appearance: She is not toxic-appearing.  HENT:     Head: Normocephalic and atraumatic.     Mouth/Throat:     Mouth: Mucous membranes are moist.     Dentition: Normal dentition.     Pharynx: Oropharynx is clear. Uvula midline. No oropharyngeal exudate or posterior oropharyngeal erythema.     Tonsils: No tonsillar exudate or tonsillar abscesses.      Comments: No sublingual or submental tenderness to palpation.  No lingual or uvular edema.  No sign of oropharyngeal abscess. Eyes:     General:        Right eye: No discharge.        Left eye: No discharge.     Extraocular Movements: Extraocular movements intact.     Conjunctiva/sclera: Conjunctivae normal.     Pupils: Pupils are equal, round, and reactive to light.  Neck:     Trachea: Trachea and phonation normal.  Cardiovascular:     Rate and Rhythm: Normal rate and regular rhythm.     Pulses: Normal pulses.  Pulmonary:     Effort: Pulmonary effort is normal. No respiratory distress.     Breath  sounds: Normal breath sounds. No wheezing or rales.  Abdominal:     General: Bowel sounds are normal. There is no distension.     Palpations: Abdomen is soft.     Tenderness: There is no abdominal tenderness. There is no guarding or rebound.  Musculoskeletal:        General: No deformity.     Cervical back: Normal range of  motion and neck supple. No edema, rigidity or crepitus. No pain with movement, spinous process tenderness or muscular tenderness.     Right lower leg: No edema.     Left lower leg: No edema.  Lymphadenopathy:     Cervical: No cervical adenopathy.  Skin:    General: Skin is warm and dry.     Capillary Refill: Capillary refill takes less than 2 seconds.  Neurological:     Mental Status: She is alert and oriented to person, place, and time. Mental status is at baseline.  Psychiatric:        Mood and Affect: Mood normal.    ED Results / Procedures / Treatments   Labs (all labs ordered are listed, but only abnormal results are displayed) Labs Reviewed - No data to display  EKG None  Radiology No results found.  Procedures Procedures   Medications Ordered in ED Medications - No data to display  ED Course  I have reviewed the triage vital signs and the nursing notes.  Pertinent labs & imaging results that were available during my care of the patient were reviewed by me and considered in my medical decision making (see chart for details).    MDM Rules/Calculators/A&P                         35 year old female with left upper dental pain times several months that started during pregnancy, now 3 months postpartum.  Does have appointment scheduled for her dentist in the next week.  Differential diagnose includes not limited to periapical infection, periodontal abscess, dental carry, fractured tooth, oropharyngeal abscess, Ludwick's angina.  Vital signs normal intake.  Cardiopulmonary exam focal abdominal exam is benign.  EGD exam revealed abnormal dentition  with dental fracture and carry with gingival edema and tenderness to palpation.  No evidence of Ludewig's angina oropharyngeal abscess.  Patient is tearful secondary to pain.  Patient's HPI and presentation most consistent with periapical infection of fractured tooth with decay.  We will start patient on Augmentin which is safe in breast-feeding, prescribe Orajel, and have her follow-up closely with her dentist as previously scheduled.  No further work-up is warranted in the ED as time.  [place] voiced understanding for medical evaluation and treatment plan.  Each of her questions was answered to her expressed satisfaction.  Return precautions given.  Patient is stable and appropriate for discharge at this time.  This chart was dictated using voice recognition software, Dragon. Despite the best efforts of this provider to proofread and correct errors, errors may still occur which can change documentation meaning.  Final Clinical Impression(s) / ED Diagnoses Final diagnoses:  None    Rx / DC Orders ED Discharge Orders     None        Sherrilee Gilles 05/07/21 0021    Koleen Distance, MD 05/07/21 [phone]

## 2021-05-06 NOTE — ED Notes (Signed)
Pt seen and discharged from triage.

## 2021-05-06 NOTE — ED Triage Notes (Signed)
C/o L upper dental pain/gum pain x 3 days.  Denies fever and chills.

## 2021-05-06 NOTE — Discharge Instructions (Addendum)
You were seen in the ER today and diagnosed with a dental infection.  You have been prescribed antibiotics to take for the next 10 days, twice a day.  Please take entire course of antibiotics as directed.  Continue using ibuprofen / Tylenol, as well as prescribed Orajel for pain.  You will need to follow-up with your dentist for continued management of this.  Please keep the appointment as prescheduled your dentist for this week.  Return to the emergency department for fevers, swelling or pain under the tongue or in the neck, difficulty breathing or swallowing, nausea or vomiting that does not stop, or any other new or concerning symptoms.

## 2021-05-23 ENCOUNTER — Telehealth: Payer: Self-pay

## 2021-05-23 NOTE — Telephone Encounter (Signed)
Patient called and left message on triage vm stating that she is having really bad cramping.  Returned patients call. No answer. Left message on vm for patient to return call to office.

## 2021-05-29 ENCOUNTER — Ambulatory Visit: Payer: Medicaid Other

## 2021-06-06 ENCOUNTER — Telehealth: Payer: Self-pay

## 2021-06-06 NOTE — Telephone Encounter (Signed)
  Patient is now enrolled into the Transportation Program.   Vanessa Wise DOB: [birth year] MRN: [id number]   RIDER WAIVER AND RELEASE OF LIABILITY  For purposes of improving physical access to our facilities, [facility] is pleased to partner with third parties to provide [facility] patients or other authorized individuals the option of convenient, on-demand ground transportation services (the [employer]") through use of the technology service that enables users to request on-demand ground transportation from independent third-party providers.  By opting to use and accept these [employer], I, the undersigned, hereby agree on behalf of myself, and on behalf of any minor child using the Science writer for whom I am the parent or legal guardian, as follows:  Science writer provided to me are provided by independent third-party transportation providers who are not [employer] or employees and who are unaffiliated with [employer]. [place] is neither a transportation carrier nor a common or public carrier. [place] [facility] has no control over the quality or safety of the transportation that occurs as a result of the [employer]. [facility] cannot guarantee that any third-party transportation provider will complete any arranged transportation service. [facility] makes no representation, warranty, or guarantee regarding the reliability, timeliness, quality, safety, suitability, or availability of any of the Transport Services or that they will be error free. I fully understand that traveling by vehicle involves risks and dangers of serious bodily injury, including permanent disability, paralysis, and death. I agree, on behalf of myself and on behalf of any minor child using the Transport Services for whom I am the parent or legal guardian, that the entire risk arising out of my use of the [employer] remains solely with me, to the maximum extent permitted  under applicable law. The [employer] are provided "as is" and "as available." [facility] disclaims all representations and warranties, express, implied or statutory, not expressly set out in these terms, including the implied warranties of merchantability and fitness for a particular purpose. I hereby waive and release [facility] [place], its agents, employees, officers, directors, representatives, insurers, attorneys, assigns, successors, subsidiaries, and affiliates from any and all past, present, or future claims, demands, liabilities, actions, causes of action, or suits of any kind directly or indirectly arising from acceptance and use of the [employer]. I further waive and release [place] and its affiliates from all present and future liability and responsibility for any injury or death to persons or damages to property caused by or related to the use of the [employer]. I have read this Waiver and Release of Liability, and I understand the terms used in it and their legal significance. This Waiver is freely and voluntarily given with the understanding that my right (as well as the right of any minor child for whom I am the parent or legal guardian using the [employer]) to legal recourse against [facility] in connection with the [employer] is knowingly surrendered in return for use of these services.   I attest that I read the consent document to Vanessa Wise, gave [name] the opportunity to ask questions and answered the questions asked (if any). I affirm that Vanessa Wise then provided consent for she's participation in this program.       Vanessa Wise

## 2021-06-07 ENCOUNTER — Ambulatory Visit (INDEPENDENT_AMBULATORY_CARE_PROVIDER_SITE_OTHER): Payer: Medicaid Other | Admitting: *Deleted

## 2021-06-07 ENCOUNTER — Other Ambulatory Visit: Payer: Self-pay

## 2021-06-07 DIAGNOSIS — Z3042 Encounter for surveillance of injectable contraceptive: Secondary | ICD-10-CM | POA: Diagnosis not present

## 2021-06-07 MED ORDER — MEDROXYPROGESTERONE ACETATE 150 MG/ML IM SUSP
150.0000 mg | Freq: Once | INTRAMUSCULAR | Status: AC
  Administered 2021-06-07: 150 mg via INTRAMUSCULAR

## 2021-06-07 NOTE — Addendum Note (Signed)
Addended by: Harrel Lemon on: 06/07/2021 04:19 PM   Modules accepted: Orders

## 2021-06-07 NOTE — Progress Notes (Signed)
Date last pap: 01/08/21. Last Depo-Provera: 03/12/21. Side Effects if any: NA. Serum HCG indicated? NA. Depo-Provera 150 mg IM given by: Montez Morita, RNC in RUO glut. Next appointment due 08/23/21-09/06/21.

## 2021-08-14 ENCOUNTER — Other Ambulatory Visit: Payer: Self-pay

## 2021-08-14 ENCOUNTER — Emergency Department (HOSPITAL_COMMUNITY)
Admission: EM | Admit: 2021-08-14 | Discharge: 2021-08-14 | Disposition: A | Discharge status: Home / Self Care | Payer: Medicaid Other | Attending: Emergency Medicine | Admitting: Emergency Medicine

## 2021-08-14 ENCOUNTER — Other Ambulatory Visit (HOSPITAL_COMMUNITY): Payer: Self-pay

## 2021-08-14 DIAGNOSIS — K029 Dental caries, unspecified: Secondary | ICD-10-CM

## 2021-08-14 DIAGNOSIS — F17210 Nicotine dependence, cigarettes, uncomplicated: Secondary | ICD-10-CM | POA: Insufficient documentation

## 2021-08-14 DIAGNOSIS — K0889 Other specified disorders of teeth and supporting structures: Secondary | ICD-10-CM | POA: Diagnosis present

## 2021-08-14 MED ORDER — ACETAMINOPHEN 500 MG PO TABS
1000.0000 mg | ORAL_TABLET | Freq: Once | ORAL | Status: AC
  Administered 2021-08-14: 1000 mg via ORAL
  Filled 2021-08-14: qty 2

## 2021-08-14 MED ORDER — PENICILLIN V POTASSIUM 500 MG PO TABS
500.0000 mg | ORAL_TABLET | Freq: Four times a day (QID) | ORAL | 0 refills | Status: DC
  Filled 2021-08-14: qty 28, 7d supply, fill #0

## 2021-08-14 MED ORDER — PENICILLIN V POTASSIUM 500 MG PO TABS
500.0000 mg | ORAL_TABLET | Freq: Four times a day (QID) | ORAL | 0 refills | Status: AC
  Filled 2021-08-14: qty 28, 7d supply, fill #0

## 2021-08-14 NOTE — ED Triage Notes (Signed)
Pt c/o of dental pain since 9/20. Pt states pain has gotten worse over the last two or three days.

## 2021-08-14 NOTE — ED Provider Notes (Signed)
Emergency Medicine Provider Triage Evaluation Note  Vanessa Wise , a 35 y.o. female  was evaluated in triage.  Pt complains of dental pain.  States that she has had pain for almost a month now but worse over the past 2 or 3 days.  Describes pain in the upper premolar which appears chipped, and pain in the left upper molars.  She states that her Medicaid called yesterday and is setting her up with a dentist however she does not have an appointment yet.    Review of Systems  Positive: Dental pain Negative: Fevers  Physical Exam  BP 118/83 (BP Location: Right Arm)   Pulse 90   Temp 98.8 F (37.1 C) (Oral)   Resp 18   SpO2 98%  Gen:   Awake, no distress   Resp:  Normal effort  MSK:   Moves extremities without difficulty  Other:  Oral exam with poor dentition.  The right upper premolar appears chipped or decayed.  She also has pain in the left upper molars.  There is no obvious abscess or swelling of the gums or buccal mucosa.  She has no swelling in her cheeks or under the tongue.  Oropharynx is nonerythematous.  There are no lesions on the tongue or the base of the mouth.  There are no other lesions/ulcers present.  Medical Decision Making  Medically screening exam initiated at 10:01 AM.  Appropriate orders placed.  Vanessa Wise was informed that the remainder of the evaluation will be completed by another provider, this initial triage assessment does not replace that evaluation, and the importance of remaining in the ED until their evaluation is complete.     Cristopher Peru, PA-C 08/14/21 1003    Terald Sleeper, MD 08/14/21 1524

## 2021-08-14 NOTE — ED Notes (Signed)
Pt provided discharge instructions and prescription information. Pt was given the opportunity to ask questions and questions were answered. Discharge signature not obtained in the setting of the COVID-19 pandemic in order to reduce high touch surfaces.  ° °

## 2021-08-14 NOTE — Discharge Instructions (Addendum)
You are seen in the emergency department today for dental pain.  It does appear that your right premolars have some decay and possible root exposure.  This can be a source of infection.  He also have pain on the left upper side of your mouth and your molars.  There is no obvious abscess or swelling for [place] to drain.  We will be giving you antibiotics called penicillin that you will take 4 times a day for the next 7 days.  Please complete this prescription in its entirety.  You stated that you are going to go to [facility] on [address] next week to be seen by a dentist.  Please ensure that you make this appointment so that you can get definitive treatment for your dental pain.  Additionally please stop smoking as we discussed at bedside.  Please return to emergency department if you begin to have swelling of your face or mouth, or begin to have fevers.  You may take ibuprofen and Tylenol as needed for pain.  Do not exceed more than 4 g of Tylenol a day.  You can take ibuprofen 400 mg every 6 hours as needed for pain.  Additionally you can go to a local drugstore and grab Orajel to help with pain relief.

## 2021-08-14 NOTE — ED Provider Notes (Signed)
[facility] EMERGENCY DEPARTMENT Provider Note   CSN: [id number] Arrival date & time: 08/14/21  7253     History Chief Complaint  Patient presents with   Dental Pain    Vanessa Wise is a 35 y.o. female. With past medical history of migraines who presents to the emergency department with dental pain.  States that she has had intermittent, aching dental pain for one month that has progressively worsened over the past 3 days. Describes pain in upper premolar which appears chipped or decays and pain in left upper molars. Pain is sharp at times. Worse with eating. Improved with rest. She denies fevers, swelling of her mouth or face, trismus, sore throat, difficulty swallowing or drainage from her mouth. Denies sinus pressure or pain. Daily tobacco use.  States that her Medicaid called yesterday and is assisting with dental referral. She states her mother is also on the phone with local dentistries attempting to get her an appointment.    Dental Pain Associated symptoms: no fever and no oral lesions       Past Medical History:  Diagnosis Date   [redacted] weeks gestation of pregnancy    Anxiety    Chlamydia infection 02/03/2012   IBS (irritable bowel syndrome)    Migraines    Pain due to intrauterine contraceptive device (IUD) (HCC) 01/17/2015   S/p hysteroscopic removal 01/17/2015    Seasonal allergies     Patient Active Problem List   Diagnosis Date Noted   Supervision of high risk pregnancy, antepartum 02/07/2021   History of 2 cesarean sections 02/07/2021   [redacted] weeks gestation of pregnancy 01/21/2021   Biological false-positive (BFP) syphilis serology test 01/19/2021   Vaginal bleeding in pregnancy, third trimester 01/19/2021   Fetal abnormality in antepartum pregnancy 12/19/2020   Late prenatal care, antepartum 12/19/2020   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   Hx of preeclampsia, prior pregnancy, currently pregnant 06/26/2016   Cannabis dependence  01/28/2012   Chronic migraine 01/28/2012   TOBACCO USER 11/06/2009    Past Surgical History:  Procedure Laterality Date   CESAREAN SECTION N/A 07/07/2016   Procedure: CESAREAN SECTION;  Surgeon: Adam Phenix, MD;  Location: [facility] B[facility]THING SUIT[facility];  Service: Obstetrics;  Laterality: N/A;   CESAREAN SECTION N/A 02/07/2021   Procedure: CESAREAN SECTION;  Surgeon: Kathrynn Running, MD;  Location: [facility] LD ORS;  Service: Obstetrics;  Laterality: N/A;   HYSTEROSCOPY N/A 01/17/2015   Procedure: HYSTEROSCOPY WITH REMOVAL INTRAUTERINE DIVICE;  Surgeon: Willodean Rosenthal, MD;  Location: [facility] ORS;  Service: Gynecology;  Laterality: N/A;   IUD REMOVAL     MOUTH SURGERY     NO PAST SURGERIES       OB History     Gravida  4   Para  4   Term  2   Preterm  2   AB      Living  3      SAB      IAB      Ectopic      Multiple  0   Live Births  3           Family History  Problem Relation Age of Onset   Hypertension Mother     Social History   Tobacco Use   Smoking status: Every Day    Packs/day: 1.00    Years: 9.00    Pack years: 9.00    Types: Cigarettes   Smokeless tobacco: Never   Tobacco comments:  1 pack or less/ day  Vaping Use   Vaping Use: Never used  Substance Use Topics   Alcohol use: No    Comment: rare   Drug use: Not Currently    Types: Marijuana    Comment: Last used 06/25/16    Home Medications Prior to Admission medications   Medication Sig Start Date End Date Taking? Authorizing Provider  penicillin v potassium (VEETID) 500 MG tablet Take 1 tablet (500 mg total) by mouth 4 (four) times daily for 7 days. 08/14/21 08/21/21 Yes Cristopher Peru, PA-C  acetaminophen (TYLENOL) 500 MG tablet Take 2 tablets (1,000 mg total) by mouth every 6 (six) hours as needed for moderate pain. 02/09/21   Bernerd Limbo, CNM  benzocaine (ORAJEL) 10 % mucosal gel Use as directed 1 application in the mouth or throat as needed for mouth pain. 05/06/21   Sponseller,  Lupe Carney R, PA-C  enalapril (VASOTEC) 10 MG tablet Take 1 tablet (10 mg total) by mouth daily. 02/08/21 02/08/22  Cresenzo-Dishmon, Scarlette Calico, CNM  ferrous sulfate 325 (65 FE) MG tablet Take 1 tablet (325 mg total) by mouth every other day. 02/08/21   Cresenzo-Dishmon, Scarlette Calico, CNM  ibuprofen (ADVIL) 600 MG tablet Take 1 tablet (600 mg total) by mouth 4 (four) times daily. 02/09/21   Bernerd Limbo, CNM  medroxyPROGESTERone (DEPO-PROVERA) 150 MG/ML injection Inject 1 mL (150 mg total) into the muscle every 3 (three) months. 03/12/21   Constant, Peggy, MD    Allergies    Patient has no known allergies.  Review of Systems   Review of Systems  Constitutional:  Negative for fever.  HENT:  Positive for dental problem. Negative for mouth sores, sinus pressure, sinus pain and sore throat.   Allergic/Immunologic: Negative for immunocompromised state.  All other systems reviewed and are negative.  Physical Exam Updated Vital Signs BP (!) 126/93 (BP Location: Right Arm)   Pulse 71   Temp 98.8 F (37.1 C) (Oral)   Resp 16   SpO2 100%   Physical Exam Vitals and nursing note reviewed.  Constitutional:      Appearance: Normal appearance. She is not toxic-appearing.  HENT:     Head: Normocephalic and atraumatic.     Nose: Nose normal. No congestion.     Mouth/Throat:     Lips: No lesions.     Mouth: Mucous membranes are dry. No oral lesions.     Dentition: Abnormal dentition. Dental tenderness and dental caries present. No gingival swelling, dental abscesses or gum lesions.     Tongue: No lesions.     Palate: No lesions.     Pharynx: Oropharynx is clear. Uvula midline. No oropharyngeal exudate or uvula swelling.     Tonsils: No tonsillar exudate or tonsillar abscesses.      Comments: Right upper premolar appears broken or decayed.  Left back molars appear intact No obvious abscesses present in the mouth. No swelling. No facial swelling or phlegmon present  No Ludwig's angina No trismus  Tongue  base without lesions  Eyes:     General: No scleral icterus.       Right eye: No discharge.        Left eye: No discharge.     Pupils: Pupils are equal, round, and reactive to light.  Cardiovascular:     Pulses: Normal pulses.  Pulmonary:     Effort: Pulmonary effort is normal. No respiratory distress.  Musculoskeletal:     Cervical back: Normal range of motion and neck  supple. No tenderness.  Lymphadenopathy:     Cervical: No cervical adenopathy.  Skin:    General: Skin is warm and dry.     Findings: No rash.  Neurological:     General: No focal deficit present.     Mental Status: She is alert and oriented to person, place, and time.  Psychiatric:        Mood and Affect: Mood normal.        Behavior: Behavior normal.        Thought Content: Thought content normal.        Judgment: Judgment normal.    ED Results / Procedures / Treatments   Labs (all labs ordered are listed, but only abnormal results are displayed) Labs Reviewed - No data to display  EKG None  Radiology No results found.  Procedures Procedures   Medications Ordered in ED Medications  acetaminophen (TYLENOL) tablet 1,000 mg (1,000 mg Oral Given 08/14/21 1027)    ED Course  I have reviewed the triage vital signs and the nursing notes.  Pertinent labs & imaging results that were available during my care of the patient were reviewed by me and considered in my medical decision making (see chart for details).  Clinical Course as of 08/14/21 2038  Tue Aug 14, 2021  1524 I resent the prescription for penicillin to the pharmacy as the PA reports there was an error with her prescription given the patient's medicaide status. [MT]    Clinical Course User Index [MT] Trifan, Kermit Balo, MD   MDM Rules/Calculators/A&P 35 year old female presents with dental pain.  Oral exam with dental tenderness. Possible exposed dental nerve on the right upper premolar as there is decaying of the tooth.  No abscess  requiring drainage  No trismus Presentation is not consistent with Ludwig's angina, PTA, RPA   Discussed patient with Dr. Renaye Rakers who recommends course of PCN for possible exposed nerve root.  I have spoken with the patient, and she updates me that her mom has communicated with [facility] about possibly being able to see her next week. I have discussed antibiotic treatment with her and need to follow-up with dentist. She can also use tylenol and ibuprofen as needed for pain relief until she can get to the dentist. I have also recommended her use Orajel over the counter. She is agreeable to the plan of care at this time. No emergent intervention needed at this time. She is given strict return precautions if worsening pain, swelling to the mouth or face, difficulty breathing or fevers.  Will discharge.  Final Clinical Impression(s) / ED Diagnoses Final diagnoses:  Dental caries    Rx / DC Orders ED Discharge Orders          Ordered    penicillin v potassium (VEETID) 500 MG tablet  4 times daily        08/14/21 1425             Cristopher Peru, PA-C 08/14/21 2051    Terald Sleeper, MD 08/15/21 [id number]

## 2021-08-29 ENCOUNTER — Ambulatory Visit: Payer: Medicaid Other

## 2021-09-05 ENCOUNTER — Ambulatory Visit (INDEPENDENT_AMBULATORY_CARE_PROVIDER_SITE_OTHER): Payer: Medicaid Other

## 2021-09-05 ENCOUNTER — Other Ambulatory Visit: Payer: Self-pay

## 2021-09-05 ENCOUNTER — Other Ambulatory Visit (HOSPITAL_COMMUNITY): Payer: Self-pay

## 2021-09-05 DIAGNOSIS — Z3042 Encounter for surveillance of injectable contraceptive: Secondary | ICD-10-CM | POA: Diagnosis not present

## 2021-09-05 MED ORDER — MEDROXYPROGESTERONE ACETATE 150 MG/ML IM SUSP
150.0000 mg | Freq: Once | INTRAMUSCULAR | Status: AC
  Administered 2021-09-05: 150 mg via INTRAMUSCULAR

## 2021-09-05 NOTE — Progress Notes (Signed)
Subjective:  Pt in for Depo Provera injection.    Objective: Need for contraception. No unusual complaints.    Assessment: Depo given L upper outer quadrant. Pt tolerated Depo injection.   Plan:  Next injection due 1/18-12/05/21.

## 2021-09-05 NOTE — Progress Notes (Signed)
Patient was assessed and managed by nursing staff during this encounter. I have reviewed the chart and agree with the documentation and plan.   Jaynie Collins, MD 09/05/2021 4:23 PM

## 2021-11-21 ENCOUNTER — Other Ambulatory Visit: Payer: Self-pay

## 2021-11-21 ENCOUNTER — Ambulatory Visit (INDEPENDENT_AMBULATORY_CARE_PROVIDER_SITE_OTHER): Payer: Medicaid Other | Admitting: *Deleted

## 2021-11-21 VITALS — Wt 164.5 lb

## 2021-11-21 DIAGNOSIS — Z3042 Encounter for surveillance of injectable contraceptive: Secondary | ICD-10-CM

## 2021-11-21 MED ORDER — MEDROXYPROGESTERONE ACETATE 150 MG/ML IM SUSP
150.0000 mg | Freq: Once | INTRAMUSCULAR | Status: AC
  Administered 2021-11-21: 150 mg via INTRAMUSCULAR

## 2021-11-21 NOTE — Progress Notes (Signed)
Date last pap: 01/08/21. Last Depo-Provera: 09/05/21. Side Effects if any: NA. Serum HCG indicated? NA. Depo-Provera 150 mg IM given by: Selena Batten. Emmi Wertheim, RNC in [place] glute. Next appointment due 02/06/22-02/20/22.

## 2022-01-28 ENCOUNTER — Emergency Department (HOSPITAL_COMMUNITY)
Admission: EM | Admit: 2022-01-28 | Discharge: 2022-01-28 | Discharge status: Against Medical Advice | Payer: Medicaid Other

## 2022-01-28 NOTE — ED Notes (Signed)
Pt states that she is leaving and going to another hospital  

## 2022-02-11 ENCOUNTER — Ambulatory Visit: Payer: Medicaid Other

## 2022-02-12 ENCOUNTER — Telehealth: Payer: Self-pay | Admitting: *Deleted

## 2022-02-12 NOTE — Telephone Encounter (Signed)
Pt called to office, had concerns with bleeding and Depo. ?Discussed and answered pt questions.  Pt advised to call office if she has any prolonged bleeding.  ?

## 2022-02-14 ENCOUNTER — Ambulatory Visit (INDEPENDENT_AMBULATORY_CARE_PROVIDER_SITE_OTHER): Payer: Medicaid Other | Admitting: *Deleted

## 2022-02-14 DIAGNOSIS — Z3042 Encounter for surveillance of injectable contraceptive: Secondary | ICD-10-CM | POA: Diagnosis not present

## 2022-02-14 MED ORDER — MEDROXYPROGESTERONE ACETATE 150 MG/ML IM SUSP
150.0000 mg | Freq: Once | INTRAMUSCULAR | Status: AC
  Administered 2022-02-14: 150 mg via INTRAMUSCULAR

## 2022-02-14 NOTE — Progress Notes (Signed)
Date last pap: 01/08/21- Normal. ?Last Depo-Provera: 11/21/21. ?Depo-Provera 150 mg IM given by: S.Maily Debarge, CMA in [place]. Pt tolerated well. ?Next appointment due 6/29-7/13. ? ? ?Administrations This Visit   ? ? medroxyPROGESTERone (DEPO-PROVERA) injection 150 mg   ? ? Admin Date ?02/14/2022 Action ?Given Dose ?150 mg Route ?Intramuscular Administered By ?Lewie Loron D, CMA  ? ?  ?  ? ?  ? ? ? ? ?

## 2022-02-14 NOTE — Progress Notes (Signed)
Patient was assessed and managed by nursing staff during this encounter. I have reviewed the chart and agree with the documentation and plan. I have also made any necessary editorial changes. ? ?Vada Swift A Domenick Quebedeaux, MD ?02/14/2022 12:49 PM   ?

## 2022-02-28 ENCOUNTER — Institutional Professional Consult (permissible substitution): Payer: Medicaid Other | Admitting: Obstetrics

## 2022-03-05 ENCOUNTER — Institutional Professional Consult (permissible substitution): Payer: Medicaid Other | Admitting: Obstetrics and Gynecology

## 2022-05-02 ENCOUNTER — Ambulatory Visit: Payer: Medicaid Other

## 2022-05-03 ENCOUNTER — Other Ambulatory Visit: Payer: Self-pay

## 2022-05-03 MED ORDER — MEDROXYPROGESTERONE ACETATE 150 MG/ML IM SUSP: 150.0000 mg | INTRAMUSCULAR | 0 refills | Status: DC

## 2022-05-08 ENCOUNTER — Ambulatory Visit: Payer: Medicaid Other

## 2022-05-14 ENCOUNTER — Ambulatory Visit (INDEPENDENT_AMBULATORY_CARE_PROVIDER_SITE_OTHER): Payer: Medicaid Other

## 2022-05-14 VITALS — BP 120/85 | HR 75 | Ht 64.0 in | Wt 169.0 lb

## 2022-05-14 DIAGNOSIS — Z3042 Encounter for surveillance of injectable contraceptive: Secondary | ICD-10-CM | POA: Diagnosis not present

## 2022-05-14 MED ORDER — MEDROXYPROGESTERONE ACETATE 150 MG/ML IM SUSP
150.0000 mg | Freq: Once | INTRAMUSCULAR | Status: AC
  Administered 2022-05-14: 150 mg via INTRAMUSCULAR

## 2022-05-14 NOTE — Progress Notes (Signed)
.  SUBJECTIVE: Vanessa Wise is a 36 y.o. female who presents for DEPO Injection BC.   OBJECTIVE: Appears well, in no apparent distress.  Vital signs are normal.   ASSESSMENT: Need for BC.  Last PAP 01/05/2021   PLAN: DEPO Injection given in [place], tolerated well.  Next DEPO due Sept. 26 - Oct. 10, 2023    Administrations This Visit     medroxyPROGESTERone (DEPO-PROVERA) injection 150 mg     Admin Date 05/14/2022 Action Given Dose 150 mg Route Intramuscular Administered By Maretta Bees, RMA

## 2022-05-27 ENCOUNTER — Emergency Department (HOSPITAL_COMMUNITY)
Admission: EM | Admit: 2022-05-27 | Discharge: 2022-05-27 | Disposition: A | Discharge status: Home / Self Care | Payer: Medicaid Other | Attending: Emergency Medicine | Admitting: Emergency Medicine

## 2022-05-27 ENCOUNTER — Encounter (HOSPITAL_COMMUNITY): Payer: Self-pay

## 2022-05-27 DIAGNOSIS — R109 Unspecified abdominal pain: Secondary | ICD-10-CM | POA: Diagnosis present

## 2022-05-27 DIAGNOSIS — Z5321 Procedure and treatment not carried out due to patient leaving prior to being seen by health care provider: Secondary | ICD-10-CM | POA: Diagnosis not present

## 2022-05-27 NOTE — ED Triage Notes (Signed)
Pt arrives POV for eval of umbilical pain/swelling onset yesterday around 1100. Denies N/V. States yesterday AM had abnormal BM w/ some ? Mucous.

## 2022-05-27 NOTE — ED Notes (Signed)
Pt stated that she was leaving  

## 2022-05-28 ENCOUNTER — Ambulatory Visit (HOSPITAL_COMMUNITY): Payer: Medicaid Other

## 2022-05-28 ENCOUNTER — Ambulatory Visit (INDEPENDENT_AMBULATORY_CARE_PROVIDER_SITE_OTHER): Payer: Medicaid Other

## 2022-05-28 ENCOUNTER — Emergency Department (HOSPITAL_COMMUNITY): Payer: Medicaid Other

## 2022-05-28 ENCOUNTER — Ambulatory Visit (HOSPITAL_COMMUNITY)
Admission: RE | Admit: 2022-05-28 | Discharge: 2022-05-28 | Disposition: A | Discharge status: Home / Self Care | Payer: Medicaid Other | Source: Ambulatory Visit | Attending: Family Medicine | Admitting: Family Medicine

## 2022-05-28 ENCOUNTER — Encounter (HOSPITAL_COMMUNITY): Payer: Self-pay

## 2022-05-28 ENCOUNTER — Emergency Department (HOSPITAL_COMMUNITY)
Admission: EM | Admit: 2022-05-28 | Discharge: 2022-05-28 | Disposition: A | Discharge status: Home / Self Care | Payer: Medicaid Other | Attending: Emergency Medicine | Admitting: Emergency Medicine

## 2022-05-28 ENCOUNTER — Other Ambulatory Visit: Payer: Self-pay

## 2022-05-28 VITALS — BP 113/83 | HR 88 | Temp 98.8°F | Resp 26

## 2022-05-28 DIAGNOSIS — R1010 Upper abdominal pain, unspecified: Secondary | ICD-10-CM

## 2022-05-28 DIAGNOSIS — R109 Unspecified abdominal pain: Secondary | ICD-10-CM | POA: Diagnosis present

## 2022-05-28 DIAGNOSIS — R1110 Vomiting, unspecified: Secondary | ICD-10-CM | POA: Diagnosis not present

## 2022-05-28 DIAGNOSIS — K8020 Calculus of gallbladder without cholecystitis without obstruction: Secondary | ICD-10-CM | POA: Diagnosis not present

## 2022-05-28 LAB — POCT URINALYSIS DIPSTICK, ED / UC
Glucose, UA: NEGATIVE mg/dL
Hgb urine dipstick: NEGATIVE
Ketones, ur: 15 mg/dL — AB
Leukocytes,Ua: NEGATIVE
Nitrite: NEGATIVE
Protein, ur: 30 mg/dL — AB
Specific Gravity, Urine: >=1.03 (ref 1.005–1.030)
Urobilinogen, UA: 0.2 mg/dL (ref 0.0–1.0)
pH: 5.5 (ref 5.0–8.0)

## 2022-05-28 LAB — COMPREHENSIVE METABOLIC PANEL
ALT: 14 U/L (ref 0–44)
AST: 20 U/L (ref 15–41)
Albumin: 3.9 g/dL (ref 3.5–5.0)
Alkaline Phosphatase: 79 U/L (ref 38–126)
Anion gap: 9 (ref 5–15)
BUN: 7 mg/dL (ref 6–20)
CO2: 22 mmol/L (ref 22–32)
Calcium: 9.8 mg/dL (ref 8.9–10.3)
Chloride: 105 mmol/L (ref 98–111)
Creatinine, Ser: 1.07 mg/dL — ABNORMAL HIGH (ref 0.44–1.00)
GFR, Estimated: >60 mL/min (ref >60)
Glucose, Bld: 104 mg/dL — ABNORMAL HIGH (ref 70–99)
Potassium: 3.9 mmol/L (ref 3.5–5.1)
Sodium: 136 mmol/L (ref 135–145)
Total Bilirubin: 0.6 mg/dL (ref 0.3–1.2)
Total Protein: 8.4 g/dL — ABNORMAL HIGH (ref 6.5–8.1)

## 2022-05-28 LAB — CBC WITH DIFFERENTIAL/PLATELET
Abs Immature Granulocytes: 0.03 10*3/uL (ref 0.00–0.07)
Basophils Absolute: 0 10*3/uL (ref 0.0–0.1)
Basophils Relative: 0 %
Eosinophils Absolute: 0 10*3/uL (ref 0.0–0.5)
Eosinophils Relative: 0 %
HCT: 42 % (ref 36.0–46.0)
Hemoglobin: 13.7 g/dL (ref 12.0–15.0)
Immature Granulocytes: 0 %
Lymphocytes Relative: 25 %
Lymphs Abs: 2.6 10*3/uL (ref 0.7–4.0)
MCH: 28.6 pg (ref 26.0–34.0)
MCHC: 32.6 g/dL (ref 30.0–36.0)
MCV: 87.7 fL (ref 80.0–100.0)
Monocytes Absolute: 0.6 10*3/uL (ref 0.1–1.0)
Monocytes Relative: 6 %
Neutro Abs: 7.1 10*3/uL (ref 1.7–7.7)
Neutrophils Relative %: 69 %
Platelets: 284 10*3/uL (ref 150–400)
RBC: 4.79 MIL/uL (ref 3.87–5.11)
RDW: 13.1 % (ref 11.5–15.5)
WBC: 10.4 10*3/uL (ref 4.0–10.5)
nRBC: 0 % (ref 0.0–0.2)

## 2022-05-28 LAB — I-STAT BETA HCG BLOOD, ED (MC, WL, AP ONLY): I-stat hCG, quantitative: <5 m[IU]/mL (ref <5)

## 2022-05-28 LAB — LIPASE, BLOOD: Lipase: 23 U/L (ref 11–51)

## 2022-05-28 LAB — POC URINE PREG, ED: Preg Test, Ur: NEGATIVE

## 2022-05-28 MED ORDER — IOHEXOL 300 MG/ML  SOLN
80.0000 mL | Freq: Once | INTRAMUSCULAR | Status: AC | PRN
  Administered 2022-05-28: 80 mL via INTRAVENOUS

## 2022-05-28 MED ORDER — ONDANSETRON 4 MG PO TBDP
ORAL_TABLET | ORAL | Status: AC
  Filled 2022-05-28: qty 1

## 2022-05-28 MED ORDER — ONDANSETRON 4 MG PO TBDP
4.0000 mg | ORAL_TABLET | Freq: Once | ORAL | Status: AC
  Administered 2022-05-28: 4 mg via ORAL

## 2022-05-28 MED ORDER — ONDANSETRON HCL 4 MG/2ML IJ SOLN
4.0000 mg | Freq: Once | INTRAMUSCULAR | Status: AC
  Administered 2022-05-28: 4 mg via INTRAVENOUS
  Filled 2022-05-28: qty 2

## 2022-05-28 MED ORDER — ONDANSETRON 4 MG PO TBDP: 4.0000 mg | ORAL_TABLET | Freq: Three times a day (TID) | ORAL | 0 refills | Status: AC | PRN

## 2022-05-28 MED ORDER — HYDROCODONE-ACETAMINOPHEN 5-325 MG PO TABS: 1.0000 | ORAL_TABLET | ORAL | 0 refills | Status: DC | PRN

## 2022-05-28 MED ORDER — MORPHINE SULFATE (PF) 4 MG/ML IV SOLN
4.0000 mg | Freq: Once | INTRAVENOUS | Status: AC
  Administered 2022-05-28: 4 mg via INTRAVENOUS
  Filled 2022-05-28: qty 1

## 2022-05-28 MED ORDER — ONDANSETRON 4 MG PO TBDP: 4.0000 mg | ORAL_TABLET | Freq: Three times a day (TID) | ORAL | 0 refills | Status: DC | PRN

## 2022-05-28 MED ORDER — SODIUM CHLORIDE 0.9 % IV BOLUS
1000.0000 mL | Freq: Once | INTRAVENOUS | Status: AC
  Administered 2022-05-28: 1000 mL via INTRAVENOUS

## 2022-05-28 NOTE — ED Notes (Signed)
Unable to provide a urine specimen

## 2022-05-28 NOTE — Progress Notes (Signed)
Patient was assessed and managed by nursing staff during this encounter. I have reviewed the chart and agree with the documentation and plan. I have also made any necessary editorial changes.  Montrey Buist, MD 05/28/2022 2:49 PM   

## 2022-05-28 NOTE — ED Triage Notes (Signed)
Pt BIB [facility] from urgent care c/o generalized abdominal pain x several days. [facility] they id a UA and x-ray and sent her for further eval. Pt rates pain 10/10.

## 2022-05-28 NOTE — ED Notes (Signed)
[facility] notified.   

## 2022-05-28 NOTE — ED Triage Notes (Signed)
Patient says pain is from under breasts and on sides.  Describes cramps in the epigastric area. And pain in center abdomen.   Has a history of constipation.  Patient has miralax and gas-x.  Patient has been having bowel movements last night and today.  Denies urinary burning.  Denies vaginal discharge.  Vomited last night, today mucus.  Patient says she cannot eat due to feeling full, but can drink.

## 2022-05-28 NOTE — ED Notes (Signed)
Pt c/o abd pain 3 days with N/V and decreased appetite. Pt grimacing in pain during eval. Pain meds administered for comfort.

## 2022-05-28 NOTE — ED Provider Triage Note (Signed)
Emergency Medicine Provider Triage Evaluation Note  Vanessa Wise , a 36 y.o. female  was evaluated in triage.  Pt complains of diffuse abdominal pain.  Started more bilateral pelvic yesterday however has been worsening and spread through her entire abdomen.  She thought she was constipated and took a dose of MiraLAX and Gas-X per her report.  She did have 1 episode of diarrhea yesterday.  No fevers.  She denies a history of anything similar. She went to urgent care prior to arrival where she had a right upper quadrant large faint calcification noted on acute abdomen with chest. She denies her pain starting after eating, her pain did not start in the right upper quadrant or upper abdomen was bilateral lower abdomen.  No urinary symptoms.  No abnormal discharge.    Physical Exam  BP 137/84 (BP Location: Right Arm)   Pulse 73   Temp 98.6 F (37 C) (Oral)   Resp 20   SpO2 100%  Gen:   Awake, appears uncomfortable Resp:  Normal effort  MSK:   Moves extremities without difficulty  Other:  Abdomen is generally tender  Medical Decision Making  Medically screening exam initiated at 12:42 PM.  Appropriate orders placed.  Vanessa Wise was informed that the remainder of the evaluation will be completed by another provider, this initial triage assessment does not replace that evaluation, and the importance of remaining in the ED until their evaluation is complete.  Patient acute abdomen series showed a possible gallstone, however given the diffuse nature of her pain and that starting in her lower abdomen I do not think starting with a limited right upper quadrant ultrasound would be appropriate.  We will start with CT abdomen pelvis.    Cristina Gong, [place] 05/28/22 1245

## 2022-05-28 NOTE — ED Provider Notes (Signed)
[facility] EMERGENCY DEPARTMENT Provider Note   CSN: [id number] Arrival date & time: 05/28/22  1206     History  Chief Complaint  Patient presents with   Abdominal Pain    Vanessa Wise is a 36 y.o. female.  Pt is a 36 yo female with pmhx significant for migraines.  She developed abd pain 1 or 2 days ago.  She took a dose of miralax as she thought she was constipated.  She said that did not help.  She denies f/c.  No n/v.  She said she feels full when she eats, so has not been eating or drinking.  She said her pain is a 10/10.       Home Medications Prior to Admission medications   Medication Sig Start Date End Date Taking? Authorizing Provider  HYDROcodone-acetaminophen (NORCO/VICODIN) 5-325 MG tablet Take 1 tablet by mouth every 4 (four) hours as needed. 05/28/22  Yes Vanessa Lefevre, MD  ondansetron (ZOFRAN-ODT) 4 MG disintegrating tablet Take 1 tablet (4 mg total) by mouth every 8 (eight) hours as needed for nausea or vomiting. 05/28/22  Yes Vanessa Lefevre, MD  acetaminophen (TYLENOL) 500 MG tablet Take 2 tablets (1,000 mg total) by mouth every 6 (six) hours as needed for moderate pain. Patient not taking: Reported on 05/14/2022 02/09/21   Bernerd Limbo, CNM  benzocaine (ORAJEL) 10 % mucosal gel Use as directed 1 application in the mouth or throat as needed for mouth pain. Patient not taking: Reported on 05/14/2022 05/06/21   Sponseller, Lupe Carney R, PA-C  enalapril (VASOTEC) 10 MG tablet Take 1 tablet (10 mg total) by mouth daily. Patient not taking: Reported on 05/28/2022 02/08/21 02/08/22  Cresenzo-Dishmon, Scarlette Calico, CNM  ferrous sulfate 325 (65 FE) MG tablet Take 1 tablet (325 mg total) by mouth every other day. Patient not taking: Reported on 05/14/2022 02/08/21   Cresenzo-Dishmon, Scarlette Calico, CNM  ibuprofen (ADVIL) 600 MG tablet Take 1 tablet (600 mg total) by mouth 4 (four) times daily. Patient not taking: Reported on 05/14/2022 02/09/21   Bernerd Limbo, CNM   medroxyPROGESTERone (DEPO-PROVERA) 150 MG/ML injection Inject 1 mL (150 mg total) into the muscle every 3 (three) months. 03/12/21   Constant, Peggy, MD  medroxyPROGESTERone (DEPO-PROVERA) 150 MG/ML injection Inject 1 mL (150 mg total) into the muscle every 3 (three) months. 05/03/22   Hermina Staggers, MD      Allergies    Patient has no known allergies.    Review of Systems   Review of Systems  Gastrointestinal:  Positive for abdominal pain.  All other systems reviewed and are negative.   Physical Exam Updated Vital Signs BP (!) 128/109   Pulse 83   Temp 98.4 F (36.9 C)   Resp 18   SpO2 100%  Physical Exam Vitals and nursing note reviewed.  Constitutional:      Appearance: She is well-developed.  HENT:     Head: Normocephalic and atraumatic.     Mouth/Throat:     Mouth: Mucous membranes are moist.     Pharynx: Oropharynx is clear.  Eyes:     Extraocular Movements: Extraocular movements intact.     Pupils: Pupils are equal, round, and reactive to light.  Cardiovascular:     Rate and Rhythm: Normal rate and regular rhythm.  Pulmonary:     Effort: Pulmonary effort is normal.     Breath sounds: Normal breath sounds.  Abdominal:     General: Abdomen is flat. Bowel sounds are normal.  Palpations: Abdomen is soft.     Tenderness: There is abdominal tenderness in the right upper quadrant.  Skin:    General: Skin is warm.     Capillary Refill: Capillary refill takes less than 2 seconds.  Neurological:     General: No focal deficit present.     Mental Status: She is alert and oriented to person, place, and time.  Psychiatric:        Mood and Affect: Mood normal.        Behavior: Behavior normal.     ED Results / Procedures / Treatments   Labs (all labs ordered are listed, but only abnormal results are displayed) Labs Reviewed  COMPREHENSIVE METABOLIC PANEL - Abnormal; Notable for the following components:      Result Value   Glucose, Bld 104 (*)    Creatinine,  Ser 1.07 (*)    Total Protein 8.4 (*)    All other components within normal limits  CBC WITH DIFFERENTIAL/PLATELET  LIPASE, BLOOD  I-STAT BETA HCG BLOOD, ED ([facility], [facility], [facility] ONLY)    EKG None  Radiology US Abdomen Limited RUQ (LIVER/GB)  Result Date: 05/28/2022 CLINICAL DATA:  [id number]. Right upper quadrant abdominal pain for 3 days. EXAM: ULTRASOUND ABDOMEN LIMITED RIGHT UPPER QUADRANT COMPARISON:  CT abdomen pelvis 05/28/2022, x-ray abdomen 05/28/2022 FINDINGS: Gallbladder: Calcified stone and gallbladder sludge within the gallbladder lumen. Gallbladder wall thickening measuring up to 4 mm. No sonographic Murphy sign noted by sonographer. Common bile duct: Diameter: 4 mm. Liver: No focal lesion identified. Within normal limits in parenchymal echogenicity. Portal vein is patent on color Doppler imaging with normal direction of blood flow towards the liver. Other: None. IMPRESSION: Cholelithiasis and gallbladder sludge with nonspecific gallbladder wall thickening. No pericholecystic fluid or sonographic Murphy sign identified. Correlate clinically for acute cholecystitis. Electronically Signed   By: Iven Finn M.D.   On: 05/28/2022 17:34   CT ABDOMEN PELVIS W CONTRAST  Result Date: 05/28/2022 CLINICAL DATA:  Abdominal pain, acute, nonlocalized EXAM: CT ABDOMEN AND PELVIS WITH CONTRAST TECHNIQUE: Multidetector CT imaging of the abdomen and pelvis was performed using the standard protocol following bolus administration of intravenous contrast. RADIATION DOSE REDUCTION: This exam was performed according to the departmental dose-optimization program which includes automated exposure control, adjustment of the mA and/or kV according to patient size and/or use of iterative reconstruction technique. CONTRAST:  25mL OMNIPAQUE IOHEXOL 300 MG/ML  SOLN COMPARISON:  None Available. FINDINGS: Lower chest: No acute abnormality. Hepatobiliary: Mild hepatic steatosis. No discrete hepatic lesion. There is 2.7 cm  stone at the neck of the gallbladder. There additional smaller stones at the fundus of the bladder. Questionable gallbladder wall thickening and some pericholecystic fluid. Pancreas: Unremarkable. No pancreatic ductal dilatation or surrounding inflammatory changes. Spleen: Normal in size without focal abnormality. Adrenals/Urinary Tract: Adrenal glands are unremarkable. Kidneys are normal, without renal calculi, focal lesion, or hydronephrosis. Bladder is unremarkable. Stomach/Bowel: Stomach is within normal limits. Appendix appears normal. No evidence of bowel wall thickening, distention, or inflammatory changes. Vascular/Lymphatic: No significant vascular findings are present. No enlarged abdominal or pelvic lymph nodes. Reproductive: Uterus and bilateral adnexa are unremarkable. Other: No abdominal wall hernia or abnormality. No abdominopelvic ascites. Musculoskeletal: No acute or significant osseous findings. IMPRESSION: There is cholelithiasis. Gallbladder is mildly dilated. Questionable pericholecystic fluid and some thickening of the gallbladder wall. Suggest ultrasound examination of the gallbladder for further evaluation and for sonographic Murphy's sign. Electronically Signed   By: Frazier Richards M.D.   On: 05/28/2022  16:19   DG Abd Acute W/Chest  Result Date: 05/28/2022 CLINICAL DATA:  Upper abdominal pain, vomiting EXAM: DG ABDOMEN ACUTE WITH 1 VIEW CHEST COMPARISON:  Chest radiograph done on 07/02/2016 FINDINGS: Cardiac size is within normal limits. Lung fields are clear of any infiltrate or pulmonary edema. There is no pleural effusion or pneumothorax. Bowel gas pattern is nonspecific. There is small stool burden in the colon. There is no fecal impaction in rectosigmoid. There is no pneumoperitoneum. No abnormal masses are seen. There is a faint ring-like calcific density measuring 2.6 x 1.6 cm in right upper quadrant of abdomen. Visualized bony structures are unremarkable. IMPRESSION: Nonspecific  bowel gas pattern.  There is no pneumoperitoneum. There is large faint calcification in right upper quadrant of abdomen. Possibility of gallbladder stone is not excluded. If clinically warranted, follow-up gallbladder sonogram may be considered. Electronically Signed   By: Elmer Picker M.D.   On: 05/28/2022 10:58    Procedures Procedures    Medications Ordered in ED Medications  iohexol (OMNIPAQUE) 300 MG/ML solution 80 mL (80 mLs Intravenous Contrast Given 05/28/22 1558)  sodium chloride 0.9 % bolus 1,000 mL (1,000 mLs Intravenous New Bag/Given 05/28/22 1740)  morphine (PF) 4 MG/ML injection 4 mg (4 mg Intravenous Given 05/28/22 1742)  ondansetron (ZOFRAN) injection 4 mg (4 mg Intravenous Given 05/28/22 1742)    ED Course/ Medical Decision Making/ A&P                           Medical Decision Making Risk Prescription drug management.   This patient presents to the ED for concern of abd pain, this involves an extensive number of treatment options, and is a complaint that carries with it a high risk of complications and morbidity.  The differential diagnosis includes cholelithiasis, appendicitis, uti, pregnancy, pyelo   Co morbidities that complicate the patient evaluation  migraines   Additional history obtained:  Additional history obtained from epic chart review External records from outside source obtained and reviewed including family   Lab Tests:  I Ordered, and personally interpreted labs.  The pertinent results include:  cbc nl, cmp nl other than mild bump in Cr 1.07; preg neg; lip neg   Imaging Studies ordered:  I ordered imaging studies including ct abd/pelvis; ruq [place]  I independently visualized and interpreted imaging which showed  CT abd/pelvis: IMPRESSION:  There is cholelithiasis. Gallbladder is mildly dilated. Questionable  pericholecystic fluid and some thickening of the gallbladder wall.  Suggest ultrasound examination of the gallbladder for  further  evaluation and for sonographic Murphy's sign.  GB US:  IMPRESSION:  Cholelithiasis and gallbladder sludge with nonspecific gallbladder  wall thickening. No pericholecystic fluid or sonographic Murphy sign  identified. Correlate clinically for acute cholecystitis.   I agree with the radiologist interpretation   Cardiac Monitoring:  The patient was maintained on a cardiac monitor.  I personally viewed and interpreted the cardiac monitored which showed an underlying rhythm of: nsr   Medicines ordered and prescription drug management:  I ordered medication including morphine and zofran  for pain and nausea  Reevaluation of the patient after these medicines showed that the patient improved I have reviewed the patients home medicines and have made adjustments as needed   Test Considered:  CT abd/pelvis; GB US   Critical Interventions:  Pain control    Problem List / ED Course:  Abd pain:  pt does have cholelithiasis.  I doubt cholecystitis as pain  is completely gone after morphine and labs are nl.  Pt is told to f/u with surgery.  She is to eat a low fat/greasy diet.  She is to return if worse.   Reevaluation:  After the interventions noted above, I reevaluated the patient and found that they have :improved   Social Determinants of Health:  Lives at home   Dispostion:  After consideration of the diagnostic results and the patients response to treatment, I feel that the patent would benefit from discharge with outpatient f/u.          Final Clinical Impression(s) / ED Diagnoses Final diagnoses:  Calculus of gallbladder without cholecystitis without obstruction    Rx / DC Orders ED Discharge Orders          Ordered    HYDROcodone-acetaminophen (NORCO/VICODIN) 5-325 MG tablet  Every 4 hours PRN        05/28/22 1830    ondansetron (ZOFRAN-ODT) 4 MG disintegrating tablet  Every 8 hours PRN        05/28/22 1830              Vanessa Lefevre, MD 05/28/22 [phone]

## 2022-05-28 NOTE — ED Notes (Signed)
Unable to get temp pt was mouth breathing.

## 2022-05-28 NOTE — ED Notes (Signed)
Patient taken to CT.

## 2022-05-29 NOTE — ED Provider Notes (Signed)
[facility] CARE CENTER   [id number] 05/28/22 Arrival Time: 1006  ASSESSMENT & PLAN:  1. Pain of upper abdomen    Appears very uncomfortable. Reports no transportation available; will send to ED via [facility]. Stable upon discharge. Question cholelithiasis with acute pain. Afebrile. No signs of cholecystitis.  Meds ordered this encounter  Medications   ondansetron (ZOFRAN-ODT) disintegrating tablet 4 mg   Reviewed expectations re: course of current medical issues. Questions answered. Outlined signs and symptoms indicating need for more acute intervention. Patient verbalized understanding. After Visit Summary given.   SUBJECTIVE: History from: patient. Vanessa Wise is a 36 y.o. female who presents with complaint of fairly persistent vaguely described non-radiating abdominal pain; grad onset approx 48 hours ago; mainly upper abdomen. Decreased appetite and PO intake; with assoc n/v. No diarrhea or blood in stool. Afebrile. Ambulatory. No h/o similar. No abd surgical history except for C-section in 2017. No LMP recorded. Patient has had an injection. No vaginal d/c or pelvic pain. Normal urination. No tx PTA.  OBJECTIVE:  Vitals:   05/28/22 1025  BP: 113/83  Pulse: 88  Resp: (!) 26  Temp: 98.8 F (37.1 C)  TempSrc: Oral  SpO2: 98%    General appearance: alert and oriented; appears to be in pain; leaning on exam table as I enter the room HEENT: [place]; AT; oropharynx moist Lungs: unlabored respirations CV: reg Abdomen: soft; without distention; moderate and poorly localized tenderness to palpation over upper abdomen ; normal bowel sounds; without masses or organomegaly; without frank guarding or rebound tenderness Back: without reported CVA tenderness; FROM at waist Extremities: without LE edema; symmetrical; without gross deformities Skin: warm and dry Neurologic: normal gait Psychological: alert and cooperative; normal mood and affect  Labs: Results for orders placed  or performed during the hospital encounter of 05/28/22  POC Urinalysis dipstick  Result Value Ref Range   Glucose, UA NEGATIVE NEGATIVE mg/dL   Bilirubin Urine MODERATE (A) NEGATIVE   Ketones, ur 15 (A) NEGATIVE mg/dL   Specific Gravity, Urine >=1.030 1.005 - 1.030   Hgb urine dipstick NEGATIVE NEGATIVE   pH 5.5 5.0 - 8.0   Protein, ur 30 (A) NEGATIVE mg/dL   Urobilinogen, UA 0.2 0.0 - 1.0 mg/dL   Nitrite NEGATIVE NEGATIVE   Leukocytes,Ua NEGATIVE NEGATIVE  POC urine pregnancy  Result Value Ref Range   Preg Test, Ur NEGATIVE NEGATIVE   Labs Reviewed  POCT URINALYSIS DIPSTICK, ED / UC - Abnormal; Notable for the following components:      Result Value   Bilirubin Urine MODERATE (*)    Ketones, ur 15 (*)    Protein, ur 30 (*)    All other components within normal limits  POC URINE PREG, ED   No Known Allergies                                             Past Medical History:  Diagnosis Date   [redacted] weeks gestation of pregnancy    Anxiety    Chlamydia infection 02/03/2012   IBS (irritable bowel syndrome)    Migraines    Pain due to intrauterine contraceptive device (IUD) (HCC) 01/17/2015   S/p hysteroscopic removal 01/17/2015    Seasonal allergies     Social History   Socioeconomic History   Marital status: Married    Spouse name: Not on file   Number of children:  Not on file   Years of education: Not on file   Highest education level: Not on file  Occupational History   Not on file  Tobacco Use   Smoking status: Every Day    Packs/day: 1.00    Years: 9.00    Total pack years: 9.00    Types: Cigarettes   Smokeless tobacco: Never   Tobacco comments:    1 pack or less/ day  Vaping Use   Vaping Use: Never used  Substance and Sexual Activity   Alcohol use: No    Comment: rare   Drug use: Yes    Types: Marijuana   Sexual activity: Yes    Birth control/protection: Injection  Other Topics Concern   Not on file  Social History Narrative   Not on file   Social  Determinants of Health   Financial Resource Strain: Not on file  Food Insecurity: Not on file  Transportation Needs: Not on file  Physical Activity: Not on file  Stress: Not on file  Social Connections: Not on file  Intimate Partner Violence: Not on file    Family History  Problem Relation Age of Onset   Hypertension Mother      Mardella Layman, MD 05/29/22 1341

## 2022-07-15 NOTE — Pre-Procedure Instructions (Signed)
Surgical Instructions    Your procedure is scheduled on Tuesday, July 23, 2022 at 7:30 AM.  Report to [facility] Main Entrance "A" at 5:30 A.M., then check in with the Admitting office.  Call this number if you have problems the morning of surgery:  [phone]   If you have any questions prior to your surgery date call [phone]: Open Monday-Friday 8am-4pm    Remember:  Do not eat after midnight the night before your surgery  You may drink clear liquids until 4:30 AM the morning of your surgery.   Clear liquids allowed are: Water, Non-Citrus Juices (without pulp), Carbonated Beverages, Clear Tea, Black Coffee Only (NO MILK, CREAM OR POWDERED CREAMER of any kind), and Gatorade.   Do not take any medications the morning of surgery.   As of today, STOP taking any Aspirin (unless otherwise instructed by your surgeon) Aleve, Naproxen, Ibuprofen, Motrin, Advil, Goody's, BC's, all herbal medications, fish oil, and all vitamins.                     Do NOT Smoke (Tobacco/Vaping) for 24 hours prior to your procedure.  If you use a CPAP at night, you may bring your mask/headgear for your overnight stay.   Contacts, glasses, piercing's, hearing aid's, dentures or partials may not be worn into surgery, please bring cases for these belongings.    For patients admitted to the hospital, discharge time will be determined by your treatment team.   Patients discharged the day of surgery will not be allowed to drive home, and someone needs to stay with them for 24 hours.  SURGICAL WAITING ROOM VISITATION Patients having surgery or a procedure may have two support people in the waiting area. Visitors may stay in the waiting area during the procedure and switch out with other visitors if needed. Children under the age of 24 must have an adult accompany them who is not the patient. If the patient needs to stay at the hospital during part of their recovery, the visitor guidelines for inpatient  rooms apply.  Please refer to the [facility] website for the visitor guidelines for Inpatients (after your surgery is over and you are in a regular room).    Special instructions:   [place]- Preparing For Surgery  Before surgery, you can play an important role. Because skin is not sterile, your skin needs to be as free of germs as possible. You can reduce the number of germs on your skin by washing with CHG (chlorahexidine gluconate) Soap before surgery.  CHG is an antiseptic cleaner which kills germs and bonds with the skin to continue killing germs even after washing.    Oral Hygiene is also important to reduce your risk of infection.  Remember - BRUSH YOUR TEETH THE MORNING OF SURGERY WITH YOUR REGULAR TOOTHPASTE  Please do not use if you have an allergy to CHG or antibacterial soaps. If your skin becomes reddened/irritated stop using the CHG.  Do not shave (including legs and underarms) for at least 48 hours prior to first CHG shower. It is OK to shave your face.  Please follow these instructions carefully.   Shower the NIGHT BEFORE SURGERY and the MORNING OF SURGERY  If you chose to wash your hair, wash your hair first as usual with your normal shampoo.  After you shampoo, rinse your hair and body thoroughly to remove the shampoo.  Use CHG Soap as you would any other liquid soap. You can apply CHG directly  to the skin and wash gently with a scrungie or a clean washcloth.   Apply the CHG Soap to your body ONLY FROM THE NECK DOWN.  Do not use on open wounds or open sores. Avoid contact with your eyes, ears, mouth and genitals (private parts). Wash Face and genitals (private parts)  with your normal soap.   Wash thoroughly, paying special attention to the area where your surgery will be performed.  Thoroughly rinse your body with warm water from the neck down.  DO NOT shower/wash with your normal soap after using and rinsing off the CHG Soap.  Pat yourself dry with a CLEAN  TOWEL.  Wear CLEAN PAJAMAS to bed the night before surgery  Place CLEAN SHEETS on your bed the night before your surgery  DO NOT SLEEP WITH PETS.   Day of Surgery: Take a shower with CHG soap. Do not wear jewelry or makeup Do not wear lotions, powders, perfumes/colognes, or deodorant. Do not shave 48 hours prior to surgery. Do not bring valuables to the hospital.  [facility] is not responsible for any belongings or valuables. Do not wear nail polish, gel polish, artificial nails, or any other type of covering on natural nails (fingers and toes) If you have artificial nails or gel coating that need to be removed by a nail salon, please have this removed prior to surgery. Artificial nails or gel coating may interfere with anesthesia's ability to adequately monitor your vital signs. Wear Clean/Comfortable clothing the morning of surgery Do not apply any deodorants/lotions.   Remember to brush your teeth WITH YOUR REGULAR TOOTHPASTE.   Please read over the following fact sheets that you were given.  If you received a COVID test during your pre-op visit  it is requested that you wear a mask when out in public, stay away from anyone that may not be feeling well and notify your surgeon if you develop symptoms. If you have been in contact with anyone that has tested positive in the last 10 days please notify you surgeon.

## 2022-07-16 ENCOUNTER — Ambulatory Visit: Payer: Self-pay | Admitting: General Surgery

## 2022-07-16 ENCOUNTER — Other Ambulatory Visit: Payer: Self-pay

## 2022-07-16 ENCOUNTER — Encounter (HOSPITAL_COMMUNITY): Payer: Self-pay

## 2022-07-16 ENCOUNTER — Encounter (HOSPITAL_COMMUNITY)
Admission: RE | Admit: 2022-07-16 | Discharge: 2022-07-16 | Disposition: A | Discharge status: Home / Self Care | Payer: Medicaid Other | Source: Ambulatory Visit | Attending: General Surgery | Admitting: General Surgery

## 2022-07-16 VITALS — BP 122/82 | HR 71 | Temp 98.3°F | Resp 17 | Ht 64.0 in | Wt 159.4 lb

## 2022-07-16 DIAGNOSIS — Z01812 Encounter for preprocedural laboratory examination: Secondary | ICD-10-CM | POA: Diagnosis present

## 2022-07-16 DIAGNOSIS — Z01818 Encounter for other preprocedural examination: Secondary | ICD-10-CM

## 2022-07-16 HISTORY — DX: Attention-deficit hyperactivity disorder, unspecified type: F90.9

## 2022-07-16 LAB — CBC
HCT: 38.9 % (ref 36.0–46.0)
Hemoglobin: 12.8 g/dL (ref 12.0–15.0)
MCH: 29 pg (ref 26.0–34.0)
MCHC: 32.9 g/dL (ref 30.0–36.0)
MCV: 88 fL (ref 80.0–100.0)
Platelets: 225 10*3/uL (ref 150–400)
RBC: 4.42 MIL/uL (ref 3.87–5.11)
RDW: 13.6 % (ref 11.5–15.5)
WBC: 8 10*3/uL (ref 4.0–10.5)
nRBC: 0 % (ref 0.0–0.2)

## 2022-07-16 NOTE — Progress Notes (Signed)
PCP - No PCP Cardiologist - Denies  PPM/ICD - Denies  Chest x-ray - N/A EKG - N/A Stress Test - Denies ECHO - Denies Cardiac Cath - Denies  Sleep Study - Denies  Diabetes: Denies  Blood Thinner Instructions: N/A Aspirin Instructions: N/A  ERAS Protcol - Yes PRE-SURGERY Ensure or G2- No  COVID TEST- N/A   Anesthesia review: No  Patient denies shortness of breath, fever, cough and chest pain at PAT appointment   All instructions explained to the patient, with a verbal understanding of the material. Patient agrees to go over the instructions while at home for a better understanding. Patient also instructed to self quarantine after being tested for COVID-19. The opportunity to ask questions was provided.    

## 2022-07-22 DIAGNOSIS — Z01818 Encounter for other preprocedural examination: Secondary | ICD-10-CM

## 2022-07-23 ENCOUNTER — Other Ambulatory Visit: Payer: Self-pay

## 2022-07-23 ENCOUNTER — Ambulatory Visit (HOSPITAL_COMMUNITY)
Admission: RE | Admit: 2022-07-23 | Discharge: 2022-07-23 | Disposition: A | Discharge status: Home / Self Care | Payer: Medicaid Other | Attending: General Surgery | Admitting: General Surgery

## 2022-07-23 ENCOUNTER — Ambulatory Visit (HOSPITAL_BASED_OUTPATIENT_CLINIC_OR_DEPARTMENT_OTHER): Payer: Medicaid Other | Admitting: Anesthesiology

## 2022-07-23 ENCOUNTER — Encounter (HOSPITAL_COMMUNITY): Payer: Self-pay | Admitting: General Surgery

## 2022-07-23 ENCOUNTER — Encounter (HOSPITAL_COMMUNITY)
Admission: RE | Disposition: A | Discharge status: Home / Self Care | Payer: Self-pay | Source: Home / Self Care | Attending: General Surgery

## 2022-07-23 ENCOUNTER — Ambulatory Visit (HOSPITAL_COMMUNITY): Payer: Medicaid Other

## 2022-07-23 ENCOUNTER — Ambulatory Visit (HOSPITAL_COMMUNITY): Payer: Medicaid Other | Admitting: Anesthesiology

## 2022-07-23 DIAGNOSIS — Z01818 Encounter for other preprocedural examination: Secondary | ICD-10-CM

## 2022-07-23 DIAGNOSIS — F17210 Nicotine dependence, cigarettes, uncomplicated: Secondary | ICD-10-CM | POA: Insufficient documentation

## 2022-07-23 DIAGNOSIS — K828 Other specified diseases of gallbladder: Secondary | ICD-10-CM | POA: Diagnosis not present

## 2022-07-23 DIAGNOSIS — F419 Anxiety disorder, unspecified: Secondary | ICD-10-CM | POA: Diagnosis not present

## 2022-07-23 DIAGNOSIS — R519 Headache, unspecified: Secondary | ICD-10-CM | POA: Diagnosis not present

## 2022-07-23 DIAGNOSIS — K8010 Calculus of gallbladder with chronic cholecystitis without obstruction: Secondary | ICD-10-CM

## 2022-07-23 HISTORY — PX: CHOLECYSTECTOMY: SHX55

## 2022-07-23 LAB — POCT PREGNANCY, URINE: Preg Test, Ur: NEGATIVE

## 2022-07-23 SURGERY — LAPAROSCOPIC CHOLECYSTECTOMY WITH INTRAOPERATIVE CHOLANGIOGRAM
Anesthesia: General

## 2022-07-23 MED ORDER — HYDROMORPHONE HCL 1 MG/ML IJ SOLN
INTRAMUSCULAR | Status: AC
  Filled 2022-07-23: qty 1

## 2022-07-23 MED ORDER — CHLORHEXIDINE GLUCONATE CLOTH 2 % EX PADS: 6.0000 | MEDICATED_PAD | Freq: Once | CUTANEOUS | Status: DC

## 2022-07-23 MED ORDER — HYDROMORPHONE HCL 1 MG/ML IJ SOLN
0.2500 mg | INTRAMUSCULAR | Status: DC | PRN
  Administered 2022-07-23 (×2): 0.5 mg via INTRAVENOUS

## 2022-07-23 MED ORDER — KETOROLAC TROMETHAMINE 30 MG/ML IJ SOLN
INTRAMUSCULAR | Status: DC | PRN
  Administered 2022-07-23: 30 mg via INTRAVENOUS

## 2022-07-23 MED ORDER — LACTATED RINGERS IV SOLN: INTRAVENOUS | Status: DC

## 2022-07-23 MED ORDER — SODIUM CHLORIDE 0.9 % IV SOLN
2.0000 g | INTRAVENOUS | Status: AC
  Administered 2022-07-23: 2 g via INTRAVENOUS
  Filled 2022-07-23: qty 2

## 2022-07-23 MED ORDER — BUPIVACAINE HCL (PF) 0.25 % IJ SOLN
INTRAMUSCULAR | Status: AC
  Filled 2022-07-23: qty 20

## 2022-07-23 MED ORDER — FENTANYL CITRATE (PF) 250 MCG/5ML IJ SOLN
INTRAMUSCULAR | Status: DC | PRN
  Administered 2022-07-23: 150 ug via INTRAVENOUS
  Administered 2022-07-23 (×2): 50 ug via INTRAVENOUS

## 2022-07-23 MED ORDER — INDOCYANINE GREEN 25 MG IV SOLR
INTRAVENOUS | Status: DC | PRN
  Administered 2022-07-23: 2.5 mg via INTRAVENOUS

## 2022-07-23 MED ORDER — ONDANSETRON HCL 4 MG/2ML IJ SOLN: 4.0000 mg | Freq: Once | INTRAMUSCULAR | Status: DC | PRN

## 2022-07-23 MED ORDER — DEXAMETHASONE SODIUM PHOSPHATE 10 MG/ML IJ SOLN
INTRAMUSCULAR | Status: DC | PRN
  Administered 2022-07-23: 10 mg via INTRAVENOUS

## 2022-07-23 MED ORDER — KETOROLAC TROMETHAMINE 30 MG/ML IJ SOLN: 30.0000 mg | Freq: Once | INTRAMUSCULAR | Status: DC | PRN

## 2022-07-23 MED ORDER — PROPOFOL 10 MG/ML IV BOLUS
INTRAVENOUS | Status: AC
  Filled 2022-07-23: qty 20

## 2022-07-23 MED ORDER — BUPIVACAINE-EPINEPHRINE 0.25% -1:200000 IJ SOLN
INTRAMUSCULAR | Status: DC | PRN
  Administered 2022-07-23: 18 mL

## 2022-07-23 MED ORDER — SCOPOLAMINE 1 MG/3DAYS TD PT72
1.0000 | MEDICATED_PATCH | TRANSDERMAL | Status: DC
  Administered 2022-07-23: 1.5 mg via TRANSDERMAL
  Filled 2022-07-23: qty 1

## 2022-07-23 MED ORDER — SODIUM CHLORIDE 0.9 % IR SOLN
Status: DC | PRN
  Administered 2022-07-23: 1000 mL

## 2022-07-23 MED ORDER — CHLORHEXIDINE GLUCONATE 0.12 % MT SOLN
15.0000 mL | Freq: Once | OROMUCOSAL | Status: AC
  Administered 2022-07-23: 15 mL via OROMUCOSAL
  Filled 2022-07-23: qty 15

## 2022-07-23 MED ORDER — ROCURONIUM BROMIDE 10 MG/ML (PF) SYRINGE
PREFILLED_SYRINGE | INTRAVENOUS | Status: DC | PRN
  Administered 2022-07-23: 70 mg via INTRAVENOUS

## 2022-07-23 MED ORDER — MIDAZOLAM HCL 2 MG/2ML IJ SOLN
INTRAMUSCULAR | Status: DC | PRN
  Administered 2022-07-23: 2 mg via INTRAVENOUS

## 2022-07-23 MED ORDER — ONDANSETRON HCL 4 MG/2ML IJ SOLN
INTRAMUSCULAR | Status: DC | PRN
  Administered 2022-07-23: 4 mg via INTRAVENOUS

## 2022-07-23 MED ORDER — OXYCODONE HCL 5 MG/5ML PO SOLN: 5.0000 mg | Freq: Once | ORAL | Status: DC | PRN

## 2022-07-23 MED ORDER — LIDOCAINE 2% (20 MG/ML) 5 ML SYRINGE
INTRAMUSCULAR | Status: DC | PRN
  Administered 2022-07-23: 60 mg via INTRAVENOUS

## 2022-07-23 MED ORDER — AMISULPRIDE (ANTIEMETIC) 5 MG/2ML IV SOLN: 10.0000 mg | Freq: Once | INTRAVENOUS | Status: DC | PRN

## 2022-07-23 MED ORDER — PHENYLEPHRINE HCL (PRESSORS) 10 MG/ML IV SOLN
INTRAVENOUS | Status: DC | PRN
  Administered 2022-07-23: 80 ug via INTRAVENOUS

## 2022-07-23 MED ORDER — MEPERIDINE HCL 25 MG/ML IJ SOLN: 6.2500 mg | INTRAMUSCULAR | Status: DC | PRN

## 2022-07-23 MED ORDER — MIDAZOLAM HCL 2 MG/2ML IJ SOLN
INTRAMUSCULAR | Status: AC
  Filled 2022-07-23: qty 2

## 2022-07-23 MED ORDER — FENTANYL CITRATE (PF) 250 MCG/5ML IJ SOLN
INTRAMUSCULAR | Status: AC
  Filled 2022-07-23: qty 5

## 2022-07-23 MED ORDER — ACETAMINOPHEN 500 MG PO TABS
1000.0000 mg | ORAL_TABLET | ORAL | Status: AC
  Administered 2022-07-23: 1000 mg via ORAL
  Filled 2022-07-23: qty 2

## 2022-07-23 MED ORDER — OXYCODONE HCL 5 MG PO TABS: 5.0000 mg | ORAL_TABLET | Freq: Four times a day (QID) | ORAL | 0 refills | Status: AC | PRN

## 2022-07-23 MED ORDER — PROPOFOL 10 MG/ML IV BOLUS
INTRAVENOUS | Status: DC | PRN
  Administered 2022-07-23: 200 mg via INTRAVENOUS

## 2022-07-23 MED ORDER — IBUPROFEN 200 MG PO TABS: 400.0000 mg | ORAL_TABLET | Freq: Three times a day (TID) | ORAL | 0 refills | Status: AC | PRN

## 2022-07-23 MED ORDER — 0.9 % SODIUM CHLORIDE (POUR BTL) OPTIME
TOPICAL | Status: DC | PRN
  Administered 2022-07-23: 1000 mL

## 2022-07-23 MED ORDER — ORAL CARE MOUTH RINSE: 15.0000 mL | Freq: Once | OROMUCOSAL | Status: AC

## 2022-07-23 MED ORDER — OXYCODONE HCL 5 MG PO TABS: 5.0000 mg | ORAL_TABLET | Freq: Once | ORAL | Status: DC | PRN

## 2022-07-23 MED ORDER — ENSURE PRE-SURGERY PO LIQD: 296.0000 mL | Freq: Once | ORAL | Status: DC

## 2022-07-23 MED ORDER — PHENYLEPHRINE 80 MCG/ML (10ML) SYRINGE FOR IV PUSH (FOR BLOOD PRESSURE SUPPORT)
PREFILLED_SYRINGE | INTRAVENOUS | Status: DC | PRN
  Administered 2022-07-23: 120 ug via INTRAVENOUS

## 2022-07-23 SURGICAL SUPPLY — 42 items
APPLIER CLIP 5 13 M/L LIGAMAX5 (MISCELLANEOUS) ×1
BAG COUNTER SPONGE SURGICOUNT (BAG) ×1 IMPLANT
CANISTER SUCT 3000ML PPV (MISCELLANEOUS) ×1 IMPLANT
CHLORAPREP W/TINT 26 (MISCELLANEOUS) ×1 IMPLANT
CLIP APPLIE 5 13 M/L LIGAMAX5 (MISCELLANEOUS) ×1 IMPLANT
CLSR STERI-STRIP ANTIMIC 1/2X4 (GAUZE/BANDAGES/DRESSINGS) IMPLANT
COVER MAYO STAND STRL (DRAPES) ×1 IMPLANT
COVER SURGICAL LIGHT HANDLE (MISCELLANEOUS) ×1 IMPLANT
DRAPE C-ARM 42X120 X-RAY (DRAPES) ×1 IMPLANT
DRSG TEGADERM 2-3/8X2-3/4 SM (GAUZE/BANDAGES/DRESSINGS) ×3 IMPLANT
DRSG TEGADERM 4X4.75 (GAUZE/BANDAGES/DRESSINGS) ×1 IMPLANT
ELECT REM PT RETURN 9FT ADLT (ELECTROSURGICAL) ×1
ELECTRODE REM PT RTRN 9FT ADLT (ELECTROSURGICAL) ×1 IMPLANT
GAUZE SPONGE 2X2 8PLY STRL LF (GAUZE/BANDAGES/DRESSINGS) ×1 IMPLANT
GLOVE BIOGEL M STRL SZ7.5 (GLOVE) ×1 IMPLANT
GLOVE INDICATOR 8.0 STRL GRN (GLOVE) ×2 IMPLANT
GOWN STRL REUS W/ TWL LRG LVL3 (GOWN DISPOSABLE) ×3 IMPLANT
GOWN STRL REUS W/TWL 2XL LVL3 (GOWN DISPOSABLE) ×1 IMPLANT
GOWN STRL REUS W/TWL LRG LVL3 (GOWN DISPOSABLE) ×3
GRASPER SUT TROCAR 14GX15 (MISCELLANEOUS) ×1 IMPLANT
KIT BASIN OR (CUSTOM PROCEDURE TRAY) ×1 IMPLANT
KIT TURNOVER KIT B (KITS) ×1 IMPLANT
NS IRRIG 1000ML POUR BTL (IV SOLUTION) ×1 IMPLANT
PAD ARMBOARD 7.5X6 YLW CONV (MISCELLANEOUS) ×1 IMPLANT
POUCH RETRIEVAL ECOSAC 10 (ENDOMECHANICALS) ×1 IMPLANT
POUCH RETRIEVAL ECOSAC 10MM (ENDOMECHANICALS) ×1
SCISSORS LAP 5X35 DISP (ENDOMECHANICALS) ×1 IMPLANT
SET CHOLANGIOGRAPH 5 50 .035 (SET/KITS/TRAYS/PACK) ×1 IMPLANT
SET IRRIG TUBING LAPAROSCOPIC (IRRIGATION / IRRIGATOR) ×1 IMPLANT
SET TUBE SMOKE EVAC HIGH FLOW (TUBING) ×1 IMPLANT
SLEEVE ENDOPATH XCEL 5M (ENDOMECHANICALS) ×2 IMPLANT
SPECIMEN JAR SMALL (MISCELLANEOUS) ×1 IMPLANT
SPONGE GAUZE 2X2 8PLY STRL LF (GAUZE/BANDAGES/DRESSINGS) IMPLANT
STRIP CLOSURE SKIN 1/2X4 (GAUZE/BANDAGES/DRESSINGS) ×1 IMPLANT
SUT MNCRL AB 4-0 PS2 18 (SUTURE) ×1 IMPLANT
SUT VIC AB 0 UR5 27 (SUTURE) ×1 IMPLANT
TOWEL GREEN STERILE (TOWEL DISPOSABLE) ×1 IMPLANT
TOWEL GREEN STERILE FF (TOWEL DISPOSABLE) ×1 IMPLANT
TRAY LAPAROSCOPIC MC (CUSTOM PROCEDURE TRAY) ×1 IMPLANT
TROCAR XCEL BLUNT TIP 100MML (ENDOMECHANICALS) ×1 IMPLANT
TROCAR Z-THREAD OPTICAL 5X100M (TROCAR) ×1 IMPLANT
WATER STERILE IRR 1000ML POUR (IV SOLUTION) ×1 IMPLANT

## 2022-07-23 NOTE — Discharge Instructions (Signed)
[facility] LAPAROSCOPIC SURGERY: POST OP INSTRUCTIONS Always review your discharge instruction sheet given to you by the facility where your surgery was performed. IF YOU HAVE DISABILITY OR FAMILY LEAVE FORMS, YOU MUST BRING THEM TO THE OFFICE FOR PROCESSING.   DO NOT GIVE THEM TO YOUR DOCTOR.  PAIN CONTROL  First take acetaminophen (Tylenol) AND/or ibuprofen (Advil) to control your pain after surgery.  Follow directions on package.  Taking acetaminophen (Tylenol) and/or ibuprofen (Advil) regularly after surgery will help to control your pain and lower the amount of prescription pain medication you may need.  You should not take more than 3,000 mg (3 grams) of acetaminophen (Tylenol) in 24 hours.  You should not take ibuprofen (Advil), aleve, motrin, naprosyn or other NSAIDS if you have a history of stomach ulcers or chronic kidney disease.  A prescription for pain medication may be given to you upon discharge.  Take your pain medication as prescribed, if you still have uncontrolled pain after taking acetaminophen (Tylenol) or ibuprofen (Advil). Use ice packs to help control pain. If you need a refill on your pain medication, please contact your pharmacy.  They will contact our office to request authorization. Prescriptions will not be filled after 5pm or on week-ends.  HOME MEDICATIONS Take your usually prescribed medications unless otherwise directed.  DIET You should follow a light diet the first few days after arrival home.  Be sure to include lots of fluids daily. Avoid fatty, fried foods.   CONSTIPATION It is common to experience some constipation after surgery and if you are taking pain medication.  Increasing fluid intake and taking a stool softener (such as Colace) will usually help or prevent this problem from occurring.  A mild laxative (Milk of Magnesia or Miralax) should be taken according to package instructions if there are no bowel movements after 48  hours.  WOUND/INCISION CARE Most patients will experience some swelling and bruising in the area of the incisions.  Ice packs will help.  Swelling and bruising can take several days to resolve.  Unless discharge instructions indicate otherwise, follow guidelines below  STERI-STRIPS - you may remove your outer bandages 48 hours after surgery, and you may shower at that time.  You have steri-strips (small skin tapes) in place directly over the incision.  These strips should be left on the skin for 7-10 days.   DERMABOND/SKIN GLUE - you may shower in 24 hours.  The glue will flake off over the next 2-3 weeks. Any sutures or staples will be removed at the office during your follow-up visit.  ACTIVITIES You may resume regular (light) daily activities beginning the next day--such as daily self-care, walking, climbing stairs--gradually increasing activities as tolerated.  You may have sexual intercourse when it is comfortable.  Refrain from any heavy lifting or straining until approved by your doctor. You may drive when you are no longer taking prescription pain medication, you can comfortably wear a seatbelt, and you can safely maneuver your car and apply brakes.  FOLLOW-UP You should see your doctor in the office for a follow-up appointment approximately 2-3 weeks after your surgery.  You should have been given your post-op/follow-up appointment when your surgery was scheduled.  If you did not receive a post-op/follow-up appointment, make sure that you call for this appointment within a day or two after you arrive home to insure a convenient appointment time.  OTHER INSTRUCTIONS   WHEN TO CALL YOUR DOCTOR: Fever over 101.0 Inability to urinate Continued   bleeding from incision. Increased pain, redness, or drainage from the incision. Increasing abdominal pain  The clinic staff is available to answer your questions during regular business hours.  Please don't hesitate to call and ask to speak to  one of the nurses for clinical concerns.  If you have a medical emergency, go to the nearest emergency room or call 911.  A surgeon from [facility] is always on call at the hospital. [address], [address], [place], [place]  [address] ? [address] [address], [place],    [address] [phone] ? [phone] ? FAX [phone] Web site: [email or web address]  

## 2022-07-23 NOTE — Op Note (Addendum)
Vanessa Wise [id number] 04/02/1986 07/23/2022  Laparoscopic Cholecystectomy with near infrared fluorescent cholangiography procedure Note  Indications: This patient presents with symptomatic gallbladder disease and will undergo laparoscopic cholecystectomy.  Pre-operative Diagnosis: symptomatic cholelithiasis  Post-operative Diagnosis: chronic calculous cholecystitis  Surgeon: Gaynelle Adu MD FACS  Assistants: Coralie Keens MD, PGY- 3 resident  Anesthesia: General endotracheal anesthesia  Procedure Details  The patient was seen again in the Holding Room. The risks, benefits, complications, treatment options, and expected outcomes were discussed with the patient. The possibilities of reaction to medication, pulmonary aspiration, perforation of viscus, bleeding, recurrent infection, finding a normal gallbladder, the need for additional procedures, failure to diagnose a condition, the possible need to convert to an open procedure, and creating a complication requiring transfusion or operation were discussed with the patient. The likelihood of improving the patient's symptoms with return to their baseline status is good.  The patient and/or family concurred with the proposed plan, giving informed consent. The site of surgery properly noted. The patient was taken to Operating Room, identified as Vanessa Wise and the procedure verified as Laparoscopic Cholecystectomy with ICG dye.  A Time Out was held and the above information confirmed. Antibiotic prophylaxis was administered.   Prior to the induction of general anesthesia, antibiotic prophylaxis was administered. General endotracheal anesthesia was then administered and tolerated well. After the induction, the abdomen was prepped with Chloraprep and draped in the sterile fashion. The patient was positioned in the supine position.  Local anesthetic agent was injected into the skin near the umbilicus and an incision made. We dissected down to  the abdominal fascia with blunt dissection.  The fascia was incised vertically and we entered the peritoneal cavity bluntly.  A pursestring suture of 0-Vicryl was placed around the fascial opening.  The Hasson cannula was inserted and secured with the stay suture.  Pneumoperitoneum was then created with CO2 and tolerated well without any adverse changes in the patient's vital signs. An 5-mm port was placed in the subxiphoid position.  Two 5-mm ports were placed in the right upper quadrant. All skin incisions were infiltrated with a local anesthetic agent before making the incision and placing the trocars.   We positioned the patient in reverse Trendelenburg, tilted slightly to the patient's left.  The gallbladder was identified, the fundus grasped and retracted cephalad. Adhesions were lysed bluntly and with the electrocautery where indicated, taking care not to injure any adjacent organs or viscus. The patient had a moderate-sized gallstone in the infundibulum.  The infundibulum was grasped and retracted laterally, exposing the peritoneum overlying the triangle of Calot. This was then divided and exposed in a blunt fashion. A critical view of the cystic duct and cystic artery was obtained.  The cystic duct was clearly identified and bluntly dissected circumferentially.  Utilizing the Stryker camera system near infrared fluorescent activity was visualized in the liver, cystic duct, common hepatic duct and common bile duct & and small bowel..  This served as a secondary confirmation of our anatomy.  The cystic duct was then ligated with clips and divided. The cystic artery which had been identified & dissected free was ligated with clips and divided as well.   The gallbladder was dissected from the liver bed in retrograde fashion with the electrocautery.  The posterior wall of the gallbladder was suspected used to the liver bed.  There was spillage of what appeared to be purulent bile in addition to some  hydrops.  The gallbladder was removed and placed in  an Ecco sac.  The gallbladder and Ecco sac were then removed through the umbilical port site. The liver bed was irrigated and inspected. Hemostasis was achieved with the electrocautery. Copious irrigation was utilized and was repeatedly aspirated until clear.  The pursestring suture was used to close the umbilical fascia.  Additional interrupted 0 Vicryl was placed at the umbilical fascia with a PMI suture passer with laparoscopic guidance.  We again inspected the right upper quadrant for hemostasis.  The umbilical closure was inspected and there was no air leak and nothing trapped within the closure. Pneumoperitoneum was released as we removed the trocars.  4-0 Monocryl was used to close the skin.    steri-strips, and clean dressings were applied. The patient was then extubated and brought to the recovery room in stable condition. Instrument, sponge, and needle counts were correct at closure and at the conclusion of the case.   Findings: Chronic Cholecystitis with Cholelithiasis, purulent tinged bile in gallbladder  Estimated Blood Loss: Minimal         Drains: none         Specimens: Gallbladder           Complications: None; patient tolerated the procedure well.         Disposition: PACU - hemodynamically stable.         Condition: stable  I was personally present and scrubbed and performed the dissection around the infundibulum, cystic duct, cystic artery and ligated the cystic structures, the surgical resident mobilized the gallbladder up out of the gallbladder fossa and was present for the remainder of the procedure except for final skin closure but was immediately available   Leighton Ruff. Redmond Pulling, MD, FACS General, Bariatric, & Minimally Invasive Surgery [facility]  [place]

## 2022-07-23 NOTE — Anesthesia Procedure Notes (Signed)
Procedure Name: Intubation Date/Time: 07/23/2022 7:40 AM  Performed by: Eligha Bridegroom, CRNAPre-anesthesia Checklist: Patient identified, Emergency Drugs available, Suction available, Patient being monitored and Timeout performed Patient Re-evaluated:Patient Re-evaluated prior to induction Oxygen Delivery Method: Circle system utilized Preoxygenation: Pre-oxygenation with 100% oxygen Induction Type: IV induction Laryngoscope Size: Mac and 3 Grade View: Grade I Tube type: Oral Number of attempts: 1 Airway Equipment and Method: Stylet Placement Confirmation: ETT inserted through vocal cords under direct vision, positive ETCO2 and breath sounds checked- equal and bilateral Secured at: 21 cm Tube secured with: Tape Dental Injury: Teeth and Oropharynx as per pre-operative assessment

## 2022-07-23 NOTE — H&P (Signed)
CC: here for surgery  Requesting provider: n/a  HPI: Vanessa Wise is an 36 y.o. female who is here for lap chole with icg dye and poss IOC. Denies changes since seen in clinic in late July.  Old hpi: Vanessa Wise is a 36 y.o. female who is seen today as an office consultation at the request of Dr. Particia Nearing for evaluation of New Consultation (Gallbladder) .  The patient was in the ED on July 24 complaining of vague nonradiating abdominal pain. It was associate with nausea vomiting. No change in stools. sHe complained of cramps in her epigastric area and pain in the center of her abdomen. She described it as cramps in the upper abdomen. sHe has been having bowel movements.  She is accompanied by family member. She states that she typically has issues with bloating and constipation but the discomfort that happened over the weekend was different. It was much more intense. It was all over her abdomen but if she had to pick one area is probably in the upper abdomen. She tried MiraLAX and Gas-X without any relief. She has been having some mild discomfort still but no fever or chills. No diarrhea, melena or hematochezia. No dysuria or hematuria.  Has had 2 emergency C-sections.   Past Medical History:  Diagnosis Date   [redacted] weeks gestation of pregnancy    ADHD (attention deficit hyperactivity disorder)    Anxiety    Chlamydia infection 02/03/2012   IBS (irritable bowel syndrome)    Migraines    Pain due to intrauterine contraceptive device (IUD) (HCC) 01/17/2015   S/p hysteroscopic removal 01/17/2015    Preeclampsia 2017   2017 & 2022   Seasonal allergies     Past Surgical History:  Procedure Laterality Date   CESAREAN SECTION N/A 07/07/2016   Procedure: CESAREAN SECTION;  Surgeon: Adam Phenix, MD;  Location: [facility];  Service: Obstetrics;  Laterality: N/A;   CESAREAN SECTION N/A 02/07/2021   Procedure: CESAREAN SECTION;  Surgeon: Kathrynn Running, MD;  Location: [facility] LD  ORS;  Service: Obstetrics;  Laterality: N/A;   HYSTEROSCOPY N/A 01/17/2015   Procedure: HYSTEROSCOPY WITH REMOVAL INTRAUTERINE DIVICE;  Surgeon: Willodean Rosenthal, MD;  Location: [facility] ORS;  Service: Gynecology;  Laterality: N/A;   IUD REMOVAL     MOUTH SURGERY     NO PAST SURGERIES      Family History  Problem Relation Age of Onset   Hypertension Mother     Social:  reports that she has been smoking cigarettes. She has a 9.00 pack-year smoking history. She has never used smokeless tobacco. She reports current drug use. Drug: Marijuana. She reports that she does not drink alcohol.  Allergies: No Known Allergies  Medications: I have reviewed the patient's current medications.   ROS - all of the below systems have been reviewed with the patient and positives are indicated with bold text General: chills, fever or night sweats Eyes: blurry vision or double vision ENT: epistaxis or sore throat Allergy/Immunology: itchy/watery eyes or nasal congestion Hematologic/Lymphatic: bleeding problems, blood clots or swollen lymph nodes Endocrine: temperature intolerance or unexpected weight changes Breast: new or changing breast lumps or nipple discharge Resp: cough, shortness of breath, or wheezing CV: chest pain or dyspnea on exertion GI: as per HPI GU: dysuria, trouble voiding, or hematuria MSK: joint pain or joint stiffness Neuro: TIA or stroke symptoms Derm: pruritus and skin lesion changes Psych: anxiety and depression  PE Blood pressure 125/89, pulse 71, temperature 98.4  F (36.9 C), temperature source Oral, resp. rate 17, height 5\' 4"  (1.626 m), weight 72.3 kg, SpO2 100 %, not currently breastfeeding. Constitutional: NAD; conversant; no deformities Eyes: Moist conjunctiva; no lid lag; anicteric; PERRL Neck: Trachea midline; no thyromegaly Lungs: Normal respiratory effort; no tactile fremitus CV: RRR; no palpable thrills; no pitting edema GI: Abd soft, nt, nd, old c/s scar; no  palpable hepatosplenomegaly MSK: Normal gait; no clubbing/cyanosis Psychiatric: Appropriate affect; alert and oriented x3 Lymphatic: No palpable cervical or axillary lymphadenopathy Skin:no rash/lesions  Results for orders placed or performed during the hospital encounter of 07/23/22 (from the past 48 hour(s))  Pregnancy, urine POC     Status: None   Collection Time: 07/23/22  6:14 AM  Result Value Ref Range   Preg Test, Ur NEGATIVE NEGATIVE    Comment:        THE SENSITIVITY OF THIS METHODOLOGY IS >24 mIU/mL     No results found.  Imaging: reviewed  A/P: Vanessa Wise is an 36 y.o. female with symptomatic cholelithiasis To OR for lap chole with icg dye and poss ioc Eras  IV abx Discussed surgical resident involvement with case All questions asked and answered  Leighton Ruff. Redmond Pulling, MD, FACS General, Bariatric, & Minimally Invasive Surgery [facility]

## 2022-07-23 NOTE — Anesthesia Preprocedure Evaluation (Addendum)
Anesthesia Evaluation  Patient identified by MRN, date of birth, ID band Patient awake    Reviewed: Allergy & Precautions, NPO status , Patient's Chart, lab work & pertinent test results  Airway Mallampati: II  TM Distance: >3 FB Neck ROM: Full    Dental no notable dental hx. (+) Teeth Intact, Dental Advisory Given, Missing,    Pulmonary Current Smoker and Patient abstained from smoking.,  9 pack year history, <1ppd No inhalers    Pulmonary exam normal breath sounds clear to auscultation       Cardiovascular negative cardio ROS Normal cardiovascular exam Rhythm:Regular Rate:Normal     Neuro/Psych  Headaches, PSYCHIATRIC DISORDERS Anxiety    GI/Hepatic (+)     substance abuse (daily marijuana )  marijuana use, Cholelithiasis    Endo/Other  negative endocrine ROS  Renal/GU negative Renal ROS  negative genitourinary   Musculoskeletal negative musculoskeletal ROS (+)   Abdominal   Peds negative pediatric ROS (+)  Hematology negative hematology ROS (+)   Anesthesia Other Findings   Reproductive/Obstetrics negative OB ROS                            Anesthesia Physical Anesthesia Plan  ASA: 2  Anesthesia Plan: General   Post-op Pain Management: Tylenol PO (pre-op)*, Toradol IV (intra-op)* and Dilaudid IV   Induction: Intravenous  PONV Risk Score and Plan: 3 and Ondansetron, Dexamethasone, Midazolam, Scopolamine patch - Pre-op and Treatment may vary due to age or medical condition  Airway Management Planned: Oral ETT  Additional Equipment: None  Intra-op Plan:   Post-operative Plan: Extubation in OR  Informed Consent: I have reviewed the patients History and Physical, chart, labs and discussed the procedure including the risks, benefits and alternatives for the proposed anesthesia with the patient or authorized representative who has indicated his/her understanding and acceptance.      Dental advisory given  Plan Discussed with: CRNA  Anesthesia Plan Comments:         Anesthesia Quick Evaluation

## 2022-07-23 NOTE — Anesthesia Postprocedure Evaluation (Signed)
Anesthesia Post Note  Patient: Vanessa Wise  Procedure(s) Performed: LAPAROSCOPIC CHOLECYSTECTOMY WITH ICG DYE INDOCYANINE GREEN FLUORESCENCE IMAGING (ICG)     Patient location during evaluation: PACU Anesthesia Type: General Level of consciousness: awake and alert, oriented and patient cooperative Pain management: pain level controlled Vital Signs Assessment: post-procedure vital signs reviewed and stable Respiratory status: spontaneous breathing, nonlabored ventilation and respiratory function stable Cardiovascular status: blood pressure returned to baseline and stable Postop Assessment: no apparent nausea or vomiting Anesthetic complications: no   No notable events documented.  Last Vitals:  Vitals:   07/23/22 0915 07/23/22 0930  BP: (!) 112/56 120/70  Pulse: 97 94  Resp: 17 13  Temp:    SpO2: 95% 100%    Last Pain:  Vitals:   07/23/22 0907  TempSrc:   PainSc: 0-No pain                 Pervis Hocking

## 2022-07-23 NOTE — Transfer of Care (Signed)
Immediate Anesthesia Transfer of Care Note  Patient: Vanessa Wise  Procedure(s) Performed: LAPAROSCOPIC CHOLECYSTECTOMY WITH ICG DYE INDOCYANINE GREEN FLUORESCENCE IMAGING (ICG)  Patient Location: PACU  Anesthesia Type:General  Level of Consciousness: drowsy  Airway & Oxygen Therapy: Patient Spontanous Breathing  Post-op Assessment: Report given to RN and Post -op Vital signs reviewed and stable  Post vital signs: Reviewed and stable  Last Vitals:  Vitals Value Taken Time  BP 98/63 07/23/22 0907  Temp    Pulse 93 07/23/22 0908  Resp 16 07/23/22 0908  SpO2 100 % 07/23/22 0908  Vitals shown include unvalidated device data.  Last Pain:  Vitals:   07/23/22 0604  TempSrc:   PainSc: 0-No pain      Patients Stated Pain Goal: 0 (97/74/14 2395)  Complications: No notable events documented.

## 2022-07-24 ENCOUNTER — Encounter (HOSPITAL_COMMUNITY): Payer: Self-pay | Admitting: General Surgery

## 2022-07-25 LAB — SURGICAL PATHOLOGY

## 2022-08-01 ENCOUNTER — Other Ambulatory Visit: Payer: Self-pay

## 2022-08-01 MED ORDER — MEDROXYPROGESTERONE ACETATE 150 MG/ML IM SUSP: 150.0000 mg | INTRAMUSCULAR | 0 refills | Status: DC

## 2022-08-02 ENCOUNTER — Ambulatory Visit: Payer: Medicaid Other | Admitting: Obstetrics and Gynecology

## 2022-08-03 ENCOUNTER — Other Ambulatory Visit: Payer: Self-pay | Admitting: Obstetrics and Gynecology

## 2022-08-12 ENCOUNTER — Ambulatory Visit (INDEPENDENT_AMBULATORY_CARE_PROVIDER_SITE_OTHER): Payer: Medicaid Other | Admitting: *Deleted

## 2022-08-12 VITALS — BP 129/85 | HR 66 | Wt 160.0 lb

## 2022-08-12 DIAGNOSIS — Z3042 Encounter for surveillance of injectable contraceptive: Secondary | ICD-10-CM | POA: Diagnosis not present

## 2022-08-12 MED ORDER — MEDROXYPROGESTERONE ACETATE 150 MG/ML IM SUSP
150.0000 mg | Freq: Once | INTRAMUSCULAR | Status: AC
  Administered 2022-08-12: 150 mg via INTRAMUSCULAR

## 2022-08-12 NOTE — Progress Notes (Signed)
Date last pap: 01/08/21. Last Depo-Provera: 05/14/22. Side Effects if any: NA. Serum HCG indicated? NA. Depo-Provera 150 mg IM given by: Lillia Abed. Lazarus Salines, RNC in [place]. Next appointment due 10/28/22-11/11/22.

## 2022-11-01 ENCOUNTER — Ambulatory Visit: Payer: Medicaid Other

## 2022-11-14 ENCOUNTER — Ambulatory Visit: Payer: Medicaid Other

## 2022-11-14 ENCOUNTER — Other Ambulatory Visit: Payer: Self-pay | Admitting: General Practice

## 2022-11-14 MED ORDER — MEDROXYPROGESTERONE ACETATE 150 MG/ML IM SUSP: 150.0000 mg | INTRAMUSCULAR | 0 refills | Status: DC

## 2022-11-18 ENCOUNTER — Ambulatory Visit (INDEPENDENT_AMBULATORY_CARE_PROVIDER_SITE_OTHER): Payer: Medicaid Other | Admitting: Emergency Medicine

## 2022-11-18 ENCOUNTER — Other Ambulatory Visit: Payer: Self-pay | Admitting: *Deleted

## 2022-11-18 VITALS — BP 137/87 | HR 99 | Wt 174.3 lb

## 2022-11-18 DIAGNOSIS — Z3042 Encounter for surveillance of injectable contraceptive: Secondary | ICD-10-CM

## 2022-11-18 MED ORDER — MEDROXYPROGESTERONE ACETATE 150 MG/ML IM SUSP
150.0000 mg | Freq: Once | INTRAMUSCULAR | Status: AC
  Administered 2022-11-18: 150 mg via INTRAMUSCULAR

## 2022-11-18 MED ORDER — MEDROXYPROGESTERONE ACETATE 150 MG/ML IM SUSP: 150.0000 mg | INTRAMUSCULAR | 0 refills | Status: DC

## 2022-11-18 NOTE — Progress Notes (Signed)
Depo reordered today under Dr Brandt Loosen due to [facility] having MCD coverage issues.  Pt aware.

## 2022-11-18 NOTE — Progress Notes (Signed)
Date last pap: 01/08/2021. Last Depo-Provera: 08/12/2022. Side Effects if any: Weight gain. Serum HCG indicated? NONE- one week outside of window. Depo-Provera 150 mg IM given by: RUOQ- due to needle error, switched to LUOQ by Corinna Lines, RN. Next appointment due 4/2-4/16.

## 2022-12-20 ENCOUNTER — Other Ambulatory Visit (HOSPITAL_COMMUNITY): Payer: Self-pay

## 2023-02-24 ENCOUNTER — Other Ambulatory Visit: Payer: Self-pay | Admitting: Obstetrics

## 2023-02-24 MED ORDER — MEDROXYPROGESTERONE ACETATE 150 MG/ML IM SUSP: 150.0000 mg | INTRAMUSCULAR | 0 refills | Status: DC

## 2023-02-25 ENCOUNTER — Ambulatory Visit: Payer: Medicaid Other | Admitting: Medical

## 2023-02-26 ENCOUNTER — Encounter: Payer: Self-pay | Admitting: Obstetrics and Gynecology

## 2023-02-26 ENCOUNTER — Ambulatory Visit (INDEPENDENT_AMBULATORY_CARE_PROVIDER_SITE_OTHER): Payer: Medicaid Other | Admitting: Obstetrics and Gynecology

## 2023-02-26 VITALS — BP 116/75 | HR 85 | Ht 64.0 in | Wt 173.0 lb

## 2023-02-26 DIAGNOSIS — Z01419 Encounter for gynecological examination (general) (routine) without abnormal findings: Secondary | ICD-10-CM | POA: Diagnosis not present

## 2023-02-26 DIAGNOSIS — Z1339 Encounter for screening examination for other mental health and behavioral disorders: Secondary | ICD-10-CM

## 2023-02-26 DIAGNOSIS — Z3042 Encounter for surveillance of injectable contraceptive: Secondary | ICD-10-CM

## 2023-02-26 MED ORDER — MEDROXYPROGESTERONE ACETATE 150 MG/ML IM SUSP: 150.0000 mg | INTRAMUSCULAR | 4 refills | Status: DC

## 2023-02-26 MED ORDER — MEDROXYPROGESTERONE ACETATE 150 MG/ML IM SUSP
150.0000 mg | Freq: Once | INTRAMUSCULAR | Status: AC
  Administered 2023-02-26: 150 mg via INTRAMUSCULAR

## 2023-02-26 NOTE — Progress Notes (Signed)
37 y.o. GYN presents for AEX.  Declined STD screening.  Need Depo refills.  DEPO Injection given in [facility], tolerated well.   Next DEPO due July 10-24, 2024  Administrations This Visit     medroxyPROGESTERone (DEPO-PROVERA) injection 150 mg     Admin Date 02/26/2023 Action Given Dose 150 mg Route Intramuscular Administered By Maretta Bees, RMA

## 2023-02-26 NOTE — Progress Notes (Signed)
GYNECOLOGY ANNUAL PREVENTATIVE CARE ENCOUNTER NOTE  History:     Vanessa Wise is a 37 y.o. [phone] female here for a routine annual gynecologic exam.  Current complaints: none, desires refill of depo provera.   Denies abnormal vaginal bleeding, discharge, pelvic pain, problems with intercourse or other gynecologic concerns.    Gynecologic History No LMP recorded. Patient has had an injection. Contraception: Depo-Provera injections Last Pap: 3/22. Results were: normal with negative HPV   Obstetric History OB History  Gravida Para Term Preterm AB Living  SAB IAB Ectopic Multiple Live Births        0 3    # Outcome Date GA Lbr Len/2nd Weight Sex Delivery Anes PTL Lv  4 Term 02/07/21 [redacted]w[redacted]d  5 lb 4.7 oz (2.4 kg) F CS-LTranv EPI  LIV  3 Preterm 07/07/16 [redacted]w[redacted]d  2 lb 5.4 oz (1.06 kg) M CS-LTranv Gen  LIV  2 Preterm 12/22/07     Vag-Spont     1 Term 01/20/05     Vag-Spont       Past Medical History:  Diagnosis Date   [redacted] weeks gestation of pregnancy    ADHD (attention deficit hyperactivity disorder)    Anxiety    Chlamydia infection 02/03/2012   IBS (irritable bowel syndrome)    Migraines    Pain due to intrauterine contraceptive device (IUD) 01/17/2015   S/p hysteroscopic removal 01/17/2015    Preeclampsia 2017   2017 & 2022   Seasonal allergies     Past Surgical History:  Procedure Laterality Date   CESAREAN SECTION N/A 07/07/2016   Procedure: CESAREAN SECTION;  Surgeon: Adam Phenix, MD;  Location: [facility];  Service: Obstetrics;  Laterality: N/A;   CESAREAN SECTION N/A 02/07/2021   Procedure: CESAREAN SECTION;  Surgeon: Kathrynn Running, MD;  Location: [facility] LD ORS;  Service: Obstetrics;  Laterality: N/A;   CHOLECYSTECTOMY N/A 07/23/2022   Procedure: LAPAROSCOPIC CHOLECYSTECTOMY WITH ICG DYE;  Surgeon: Gaynelle Adu, MD;  Location: [facility] OR;  Service: General;  Laterality: N/A;   HYSTEROSCOPY N/A 01/17/2015   Procedure: HYSTEROSCOPY WITH REMOVAL  INTRAUTERINE DIVICE;  Surgeon: Willodean Rosenthal, MD;  Location: [facility] ORS;  Service: Gynecology;  Laterality: N/A;   IUD REMOVAL     MOUTH SURGERY     NO PAST SURGERIES      Current Outpatient Medications on File Prior to Visit  Medication Sig Dispense Refill   medroxyPROGESTERone (DEPO-PROVERA) 150 MG/ML injection Inject 1 mL (150 mg total) into the muscle every 3 (three) months. 1 mL 0   acetaminophen (TYLENOL) 500 MG tablet Take 2 tablets (1,000 mg total) by mouth every 6 (six) hours as needed for moderate pain. (Patient not taking: Reported on 05/14/2022) 30 tablet 0   enalapril (VASOTEC) 10 MG tablet Take 1 tablet (10 mg total) by mouth daily. (Patient not taking: Reported on 07/11/2022) 30 tablet 11   ferrous sulfate 325 (65 FE) MG tablet Take 1 tablet (325 mg total) by mouth every other day. (Patient not taking: Reported on 07/11/2022) 15 tablet 1   ibuprofen (ADVIL) 200 MG tablet Take 2-3 tablets (400-600 mg total) by mouth every 8 (eight) hours as needed for moderate pain or headache. 30 tablet 0   ondansetron (ZOFRAN-ODT) 4 MG disintegrating tablet Take 1 tablet (4 mg total) by mouth every 8 (eight) hours as needed for nausea or vomiting. (Patient not taking: Reported on 07/11/2022) 20 tablet 0  oxyCODONE (OXY IR/ROXICODONE) 5 MG immediate release tablet Take 1 tablet (5 mg total) by mouth every 6 (six) hours as needed for severe pain. 10 tablet 0   No current facility-administered medications on file prior to visit.    No Known Allergies  Social History:  reports that she has been smoking cigarettes. She has a 9.00 pack-year smoking history. She has never used smokeless tobacco. She reports current drug use. Drug: Marijuana. She reports that she does not drink alcohol.  Family History  Problem Relation Age of Onset   Hypertension Mother     The following portions of the patient's history were reviewed and updated as appropriate: allergies, current medications, past family history,  past medical history, past social history, past surgical history and problem list.  Review of Systems Pertinent items noted in HPI and remainder of comprehensive ROS otherwise negative.  Physical Exam:  BP 116/75   Pulse 85   Ht  (1.626 m)   Wt 173 lb (78.5 kg)   BMI 29.70 kg/m  CONSTITUTIONAL: Well-developed, well-nourished female in no acute distress.  HENT:  Normocephalic, atraumatic, External right and left ear normal. Oropharynx is clear and moist EYES: Conjunctivae and EOM are normal.  NECK: Normal range of motion, supple, no masses.  Normal thyroid.  SKIN: Skin is warm and dry. No rash noted. Not diaphoretic. No erythema. No pallor. MUSCULOSKELETAL: Normal range of motion. No tenderness.  No cyanosis, clubbing, or edema.  2+ distal pulses. NEUROLOGIC: Alert and oriented to person, place, and time. Normal reflexes, muscle tone coordination.  PSYCHIATRIC: Normal mood and affect. Normal behavior. Normal judgment and thought content. CARDIOVASCULAR: Normal heart rate noted, regular rhythm RESPIRATORY: Clear to auscultation bilaterally. Effort and breath sounds normal, no problems with respiration noted. BREASTS: Symmetric in size. No masses, tenderness, skin changes, nipple drainage, or lymphadenopathy bilaterally. Performed in the presence of a chaperone. ABDOMEN: Soft, no distention noted.  No tenderness, rebound or guarding. Previous transverse c section scar noted PELVIC: Normal appearing external genitalia and urethral meatus; normal appearing vaginal mucosa and cervix.  No abnormal discharge noted.  Normal uterine size, no other palpable masses, no uterine or adnexal tenderness.  Performed in the presence of a chaperone.   Assessment and Plan:    1. Women's annual routine gynecological examination Normal annual exam  2. Encounter for Depo-Provera contraception Rx sent for continued depo provera - medroxyPROGESTERone (DEPO-PROVERA) injection 150 mg - medroxyPROGESTERone  (DEPO-PROVERA) 150 MG/ML injection; Inject 1 mL (150 mg total) into the muscle every 3 (three) months.  Dispense: 1 mL; Refill: 4  Routine preventative health maintenance measures emphasized. Please refer to After Visit Summary for other counseling recommendations.      Mariel Aloe, MD, FACOG Obstetrician & Gynecologist, [facility] for [facility], [facility]

## 2023-06-09 ENCOUNTER — Ambulatory Visit (INDEPENDENT_AMBULATORY_CARE_PROVIDER_SITE_OTHER): Payer: Medicaid Other | Admitting: Emergency Medicine

## 2023-06-09 VITALS — BP 131/86 | HR 73 | Ht 64.0 in | Wt 174.0 lb

## 2023-06-09 DIAGNOSIS — Z3202 Encounter for pregnancy test, result negative: Secondary | ICD-10-CM | POA: Diagnosis not present

## 2023-06-09 DIAGNOSIS — Z3042 Encounter for surveillance of injectable contraceptive: Secondary | ICD-10-CM

## 2023-06-09 LAB — POCT PREGNANCY, URINE: Preg Test, Ur: NEGATIVE

## 2023-06-09 MED ORDER — MEDROXYPROGESTERONE ACETATE 150 MG/ML IM SUSP
150.0000 mg | Freq: Once | INTRAMUSCULAR | Status: AC
  Administered 2023-06-09: 150 mg via INTRAMUSCULAR

## 2023-06-09 NOTE — Progress Notes (Signed)
Date last pap: 01/08/2021. Last Depo-Provera: 02/26/2023. Side Effects if any: NA. Serum HCG indicated? No, urine HCG indicated, patient outside of window. Depo-Provera 150 mg IM given by: Resa Miner RN into . Next appointment due Oct 21-Nov 4.

## 2023-08-19 ENCOUNTER — Other Ambulatory Visit: Payer: Self-pay | Admitting: Obstetrics

## 2023-08-19 MED ORDER — MEDROXYPROGESTERONE ACETATE 150 MG/ML IM SUSP: 150.0000 mg | INTRAMUSCULAR | 0 refills | Status: DC

## 2023-08-26 ENCOUNTER — Ambulatory Visit: Payer: Medicaid Other

## 2023-09-08 ENCOUNTER — Ambulatory Visit: Payer: Medicaid Other

## 2023-09-08 DIAGNOSIS — Z3042 Encounter for surveillance of injectable contraceptive: Secondary | ICD-10-CM | POA: Diagnosis not present

## 2023-09-08 MED ORDER — MEDROXYPROGESTERONE ACETATE 150 MG/ML IM SUSP
150.0000 mg | Freq: Once | INTRAMUSCULAR | Status: AC
  Administered 2023-09-08: 150 mg via INTRAMUSCULAR

## 2023-09-08 NOTE — Progress Notes (Signed)
Date last pap: 01/08/21. Last Depo-Provera: 06/09/2023. Side Effects if any: None. Serum HCG indicated? N/A. Depo-Provera 150 mg IM given by: Haniyyah Sakuma B. RN,BSN. Given in [facility]. Patient tolerated well.Marland Kitchen Next appointment due Jan 20- Feb 3.Marland Kitchen

## 2023-11-21 ENCOUNTER — Other Ambulatory Visit (HOSPITAL_COMMUNITY): Payer: Self-pay

## 2023-11-22 ENCOUNTER — Other Ambulatory Visit: Payer: Self-pay | Admitting: Obstetrics and Gynecology

## 2023-11-22 ENCOUNTER — Other Ambulatory Visit (HOSPITAL_COMMUNITY): Payer: Self-pay

## 2023-11-22 DIAGNOSIS — Z3042 Encounter for surveillance of injectable contraceptive: Secondary | ICD-10-CM

## 2023-11-24 ENCOUNTER — Other Ambulatory Visit (HOSPITAL_COMMUNITY): Payer: Self-pay

## 2023-11-24 ENCOUNTER — Other Ambulatory Visit: Payer: Self-pay | Admitting: Obstetrics and Gynecology

## 2023-11-24 ENCOUNTER — Ambulatory Visit: Payer: Medicaid Other

## 2023-11-24 DIAGNOSIS — Z3042 Encounter for surveillance of injectable contraceptive: Secondary | ICD-10-CM

## 2023-11-24 MED ORDER — MEDROXYPROGESTERONE ACETATE 150 MG/ML IM SUSP
150.0000 mg | INTRAMUSCULAR | 0 refills | Status: AC
  Filled 2023-11-24: qty 1, 90d supply, fill #0

## 2023-11-24 NOTE — Progress Notes (Signed)
Routing RX to new pharmacy

## 2023-11-26 ENCOUNTER — Other Ambulatory Visit (HOSPITAL_COMMUNITY): Payer: Self-pay

## 2023-12-04 ENCOUNTER — Ambulatory Visit: Payer: Medicaid Other

## 2023-12-04 VITALS — Ht 60.0 in | Wt 179.0 lb

## 2023-12-04 DIAGNOSIS — Z3042 Encounter for surveillance of injectable contraceptive: Secondary | ICD-10-CM

## 2023-12-04 MED ORDER — MEDROXYPROGESTERONE ACETATE 150 MG/ML IM SUSP
150.0000 mg | Freq: Once | INTRAMUSCULAR | Status: AC
  Administered 2023-12-04: 150 mg via INTRAMUSCULAR

## 2023-12-04 NOTE — Progress Notes (Signed)
Date last pap: 01-08-2021. Last Depo-Provera: 09-08-23. Side Effects if any: N/A pt tolerated well. Serum HCG indicated? N/A depo given on schedule. Depo-Provera 150 mg IM given by: Hope Pigeon, CMA in the [facility] per pt request. Next appointment due 4/17-5/1.

## 2024-02-10 NOTE — Progress Notes (Unsigned)
 ANNUAL EXAM Patient name: Vanessa Wise MRN [id number]  Date of birth: [birth year] Chief Complaint:   No chief complaint on file.  History of Present Illness:   Vanessa Wise is a 38 y.o. [phone] {race:25618} female being seen today for a routine annual exam.  Current complaints: ***  No LMP recorded. Patient has had an injection.   The pregnancy intention screening data noted above was reviewed. Potential methods of contraception were discussed. The patient elected to proceed with No data recorded.   Last pap 01/08/21 NILM, HRHPV negative. H/O abnormal pap: {yes/yes***/no:23866} Last mammogram: Not yet indicated due to age. Family h/o breast cancer: {yes***/no:23838} Last colonoscopy: Not yet indicated due to age. Family h/o colorectal cancer: {yes***/no:23838}     02/26/2023    2:17 PM 08/27/2013   11:42 AM  Depression screen PHQ 2/9  Decreased Interest 0 0  Down, Depressed, Hopeless 0 0  PHQ - 2 Score 0 0  Altered sleeping 0   Tired, decreased energy 0   Change in appetite 0   Feeling bad or failure about yourself  0   Trouble concentrating 0   Moving slowly or fidgety/restless 0   Suicidal thoughts 0   PHQ-9 Score 0   Difficult doing work/chores Not difficult at all          No data to display           Review of Systems:   Pertinent items are noted in HPI Denies any headaches, blurred vision, fatigue, shortness of breath, chest pain, abdominal pain, abnormal vaginal discharge/itching/odor/irritation, problems with periods, bowel movements, urination, or intercourse unless otherwise stated above. Pertinent History Reviewed:  Reviewed past medical,surgical, social and family history.  Reviewed problem list, medications and allergies. Physical Assessment:  There were no vitals filed for this visit.There is no height or weight on file to calculate BMI.        Physical Examination:   General appearance - well appearing, and in no distress  Mental status -  alert, oriented to person, place, and time  Psych:  She has a normal mood and affect  Skin - warm and dry, normal color, no suspicious lesions noted  Chest - effort normal, all lung fields clear to auscultation bilaterally  Heart - normal rate and regular rhythm  Neck:  midline trachea, no thyromegaly or nodules  Breasts - breasts appear normal, no suspicious masses, no skin or nipple changes or  axillary nodes  Abdomen - soft, nontender, nondistended, no masses or organomegaly  Pelvic - VULVA: normal appearing vulva with no masses, tenderness or lesions  VAGINA: normal appearing vagina with normal color and discharge, no lesions  CERVIX: normal appearing cervix without discharge or lesions, no CMT  Thin prep pap is {Desc; done/not:10129} *** HR HPV cotesting  UTERUS: uterus is felt to be normal size, shape, consistency and nontender   ADNEXA: No adnexal masses or tenderness noted.  Extremities:  No swelling or varicosities noted  Chaperone present for exam  No results found for this or any previous visit (from the past 24 hours).  Assessment & Plan:  1. Encounter for annual routine gynecological examination (Primary) 2. Cervical cancer screening  - Cervical cancer screening: Discussed guidelines. Pap with HPV due for repeat 2027.  - Gardasil: {Blank single:19197::"***","has not yet had. Will provide information","completed","has not yet had. Counseling provided and she declines","Has not yet had. Counseling provided and pt accepts"} - GC/CT: {Blank single:19197::"accepts","declines","not indicated"} - Birth Control: {Birth control type:23956} -  Breast Health: Encouraged self breast awareness/SBE. Teaching provided.  - Mammogram: {Mammo f/u:25212::"@ 37yo"}, or sooner if problems - Colonoscopy: {TCS f/u:25213::"@ 38yo"}, or sooner if problems - F/U 12 months and prn    No orders of the defined types were placed in this encounter.   Meds: No orders of the defined types were placed in  this encounter.   Follow-up: No follow-ups on file.  Ralene Muskrat, [place] 02/10/2024 12:54 PM

## 2024-02-12 ENCOUNTER — Ambulatory Visit: Admitting: Physician Assistant

## 2024-02-12 ENCOUNTER — Other Ambulatory Visit (HOSPITAL_COMMUNITY)
Admission: RE | Admit: 2024-02-12 | Discharge: 2024-02-12 | Disposition: A | Discharge status: Home / Self Care | Source: Ambulatory Visit | Attending: Physician Assistant | Admitting: Physician Assistant

## 2024-02-12 ENCOUNTER — Encounter: Payer: Self-pay | Admitting: Physician Assistant

## 2024-02-12 VITALS — BP 121/84 | HR 76 | Ht 64.0 in | Wt 163.3 lb

## 2024-02-12 DIAGNOSIS — Z3042 Encounter for surveillance of injectable contraceptive: Secondary | ICD-10-CM

## 2024-02-12 DIAGNOSIS — Z124 Encounter for screening for malignant neoplasm of cervix: Secondary | ICD-10-CM

## 2024-02-12 DIAGNOSIS — Z113 Encounter for screening for infections with a predominantly sexual mode of transmission: Secondary | ICD-10-CM

## 2024-02-12 DIAGNOSIS — Z01419 Encounter for gynecological examination (general) (routine) without abnormal findings: Secondary | ICD-10-CM

## 2024-02-12 MED ORDER — MEDROXYPROGESTERONE ACETATE 150 MG/ML IM SUSP: 150.0000 mg | INTRAMUSCULAR | 1 refills | Status: AC

## 2024-02-12 NOTE — Progress Notes (Signed)
 Pt presents for annual. Pt would like all std testing. Pt would like her depo to go to [facility] [phone] on [address]

## 2024-02-13 ENCOUNTER — Encounter: Payer: Self-pay | Admitting: Physician Assistant

## 2024-02-13 LAB — HEPATITIS B SURFACE ANTIGEN: Hepatitis B Surface Ag: NEGATIVE

## 2024-02-13 LAB — RPR: RPR Ser Ql: NONREACTIVE

## 2024-02-13 LAB — HIV ANTIBODY (ROUTINE TESTING W REFLEX): HIV Screen 4th Generation wRfx: NONREACTIVE

## 2024-02-13 LAB — HEPATITIS C ANTIBODY: Hep C Virus Ab: NONREACTIVE

## 2024-02-17 ENCOUNTER — Encounter: Payer: Self-pay | Admitting: Physician Assistant

## 2024-02-17 LAB — CERVICOVAGINAL ANCILLARY ONLY
Bacterial Vaginitis (gardnerella): NEGATIVE
Candida Glabrata: NEGATIVE
Candida Vaginitis: NEGATIVE
Chlamydia: NEGATIVE
Comment: NEGATIVE
Comment: NEGATIVE
Comment: NEGATIVE
Comment: NEGATIVE
Comment: NEGATIVE
Comment: NORMAL
Neisseria Gonorrhea: NEGATIVE
Trichomonas: NEGATIVE

## 2024-02-26 ENCOUNTER — Ambulatory Visit: Payer: Medicaid Other

## 2024-03-04 ENCOUNTER — Ambulatory Visit

## 2024-03-04 VITALS — BP 113/80 | HR 89

## 2024-03-04 DIAGNOSIS — Z3042 Encounter for surveillance of injectable contraceptive: Secondary | ICD-10-CM | POA: Diagnosis not present

## 2024-03-04 MED ORDER — MEDROXYPROGESTERONE ACETATE 150 MG/ML IM SUSP
150.0000 mg | Freq: Once | INTRAMUSCULAR | Status: AC
  Administered 2024-03-04: 150 mg via INTRAMUSCULAR

## 2024-03-04 NOTE — Progress Notes (Signed)
 Date last pap: 01/08/21. Last Depo-Provera : 12/04/23. Side Effects if any: NA. Serum HCG indicated? NA. Depo-Provera  150 mg IM given by: Artemio Bilberry, RN. Next appointment due July 17-31 2025.

## 2024-03-25 ENCOUNTER — Ambulatory Visit: Admitting: Family Medicine

## 2024-05-20 ENCOUNTER — Ambulatory Visit

## 2024-06-21 ENCOUNTER — Ambulatory Visit (INDEPENDENT_AMBULATORY_CARE_PROVIDER_SITE_OTHER)

## 2024-06-21 VITALS — BP 125/85 | HR 79

## 2024-06-21 DIAGNOSIS — Z3042 Encounter for surveillance of injectable contraceptive: Secondary | ICD-10-CM | POA: Diagnosis not present

## 2024-06-21 DIAGNOSIS — Z3202 Encounter for pregnancy test, result negative: Secondary | ICD-10-CM | POA: Diagnosis not present

## 2024-06-21 LAB — POCT URINE PREGNANCY: Preg Test, Ur: NEGATIVE

## 2024-06-21 MED ORDER — MEDROXYPROGESTERONE ACETATE 150 MG/ML IM SUSP: 150.0000 mg | INTRAMUSCULAR | 1 refills | Status: AC

## 2024-06-21 MED ORDER — MEDROXYPROGESTERONE ACETATE 150 MG/ML IM SUSP
150.0000 mg | Freq: Once | INTRAMUSCULAR | Status: AC
  Administered 2024-06-21: 150 mg via INTRAMUSCULAR

## 2024-06-21 NOTE — Progress Notes (Signed)
 Date last pap: 01/08/21. Last Depo-Provera : 03/04/24. Pt outside of window. Per protocol, if no unprotected intercourse in past two weeks, run UPT, if UPT negative, OK to give injection. UPT in office today negative. Side Effects if any: NA. Serum HCG indicated? Neg. Depo-Provera  150 mg IM given by: Duwaine Galla, RN. Next appointment due Nov 3-17 2025.

## 2024-09-07 ENCOUNTER — Ambulatory Visit

## 2024-09-14 ENCOUNTER — Ambulatory Visit (INDEPENDENT_AMBULATORY_CARE_PROVIDER_SITE_OTHER): Admitting: General Practice

## 2024-09-14 VITALS — BP 124/83 | HR 83

## 2024-09-14 DIAGNOSIS — Z3042 Encounter for surveillance of injectable contraceptive: Secondary | ICD-10-CM

## 2024-09-14 MED ORDER — MEDROXYPROGESTERONE ACETATE 150 MG/ML IM SUSP
150.0000 mg | Freq: Once | INTRAMUSCULAR | Status: AC
Start: 2024-09-14 — End: 2024-09-14
  Administered 2024-09-14: 150 mg via INTRAMUSCULAR

## 2024-09-14 NOTE — Progress Notes (Signed)
 Date last pap: 01-08-21. Last Depo-Provera : 06-21-24. Side Effects if any: N/A pt tolerated well. Serum HCG indicated? N/A Depo given on schedule. Depo-Provera  150 mg IM given by: Rosina Ku, CMA in the [facility] per pt request. Next appointment due 1/27-2/10.

## 2024-09-14 NOTE — Progress Notes (Signed)
 Patient was assessed and managed by nursing staff during this encounter. I have reviewed the chart and agree with the documentation and plan. I have also made any necessary editorial changes.  Jerilynn DELENA Buddle, MD 09/14/2024 5:01 PM

## 2024-11-30 ENCOUNTER — Ambulatory Visit
